# Patient Record
Sex: Female | Born: 1950
Health system: Southern US, Community
[De-identification: ages and names within clinical notes are randomized; demographics above are authoritative.]

## PROBLEM LIST (undated history)

## (undated) DIAGNOSIS — J189 Pneumonia, unspecified organism: Secondary | ICD-10-CM

## (undated) DIAGNOSIS — R5383 Other fatigue: Secondary | ICD-10-CM

## (undated) DIAGNOSIS — I341 Nonrheumatic mitral (valve) prolapse: Secondary | ICD-10-CM

## (undated) DIAGNOSIS — I480 Paroxysmal atrial fibrillation: Secondary | ICD-10-CM

## (undated) DIAGNOSIS — C801 Malignant (primary) neoplasm, unspecified: Secondary | ICD-10-CM

## (undated) DIAGNOSIS — M1990 Unspecified osteoarthritis, unspecified site: Secondary | ICD-10-CM

## (undated) DIAGNOSIS — J029 Acute pharyngitis, unspecified: Secondary | ICD-10-CM

## (undated) DIAGNOSIS — M797 Fibromyalgia: Secondary | ICD-10-CM

## (undated) DIAGNOSIS — I4892 Unspecified atrial flutter: Secondary | ICD-10-CM

## (undated) DIAGNOSIS — E669 Obesity, unspecified: Secondary | ICD-10-CM

## (undated) DIAGNOSIS — M940 Chondrocostal junction syndrome [Tietze]: Secondary | ICD-10-CM

## (undated) DIAGNOSIS — G4700 Insomnia, unspecified: Secondary | ICD-10-CM

## (undated) DIAGNOSIS — J329 Chronic sinusitis, unspecified: Secondary | ICD-10-CM

## (undated) DIAGNOSIS — M76899 Other specified enthesopathies of unspecified lower limb, excluding foot: Secondary | ICD-10-CM

## (undated) DIAGNOSIS — A048 Other specified bacterial intestinal infections: Secondary | ICD-10-CM

## (undated) HISTORY — DX: Pneumonia, unspecified organism: J18.9

## (undated) HISTORY — PX: APPENDECTOMY: SHX54

## (undated) HISTORY — PX: BREAST LUMPECTOMY: SHX2

## (undated) HISTORY — DX: Obesity, unspecified: E66.9

## (undated) HISTORY — PX: CHOLECYSTECTOMY: SHX55

## (undated) HISTORY — DX: Other specified bacterial intestinal infections: A04.8

## (undated) HISTORY — DX: Paroxysmal atrial fibrillation: I48.0

## (undated) HISTORY — PX: TONSILLECTOMY: SUR1361

## (undated) HISTORY — DX: Unspecified atrial flutter: I48.92

## (undated) HISTORY — DX: Chronic sinusitis, unspecified: J32.9

## (undated) HISTORY — DX: Other fatigue: R53.83

## (undated) HISTORY — DX: Chondrocostal junction syndrome (tietze): M94.0

## (undated) HISTORY — DX: Nonrheumatic mitral (valve) prolapse: I34.1

## (undated) HISTORY — DX: Acute pharyngitis, unspecified: J02.9

## (undated) HISTORY — PX: ABDOMINAL HYSTERECTOMY: SHX81

## (undated) HISTORY — DX: Other specified enthesopathies of unspecified lower limb, excluding foot: M76.899

## (undated) HISTORY — DX: Insomnia, unspecified: G47.00

## (undated) HISTORY — DX: Fibromyalgia: M79.7

---

## 1999-11-23 ENCOUNTER — Other Ambulatory Visit: Admission: RE | Admit: 1999-11-23 | Discharge: 1999-11-23 | Payer: Self-pay | Admitting: Obstetrics and Gynecology

## 2007-03-11 ENCOUNTER — Encounter: Admission: RE | Admit: 2007-03-11 | Discharge: 2007-03-11 | Payer: Self-pay | Admitting: Family Medicine

## 2007-03-12 ENCOUNTER — Encounter: Admission: RE | Admit: 2007-03-12 | Discharge: 2007-03-12 | Payer: Self-pay | Admitting: Family Medicine

## 2008-05-06 ENCOUNTER — Encounter: Admission: RE | Admit: 2008-05-06 | Discharge: 2008-05-06 | Payer: Self-pay | Admitting: Family Medicine

## 2008-05-13 ENCOUNTER — Encounter: Admission: RE | Admit: 2008-05-13 | Discharge: 2008-05-13 | Payer: Self-pay | Admitting: Family Medicine

## 2008-05-18 ENCOUNTER — Encounter: Admission: RE | Admit: 2008-05-18 | Discharge: 2008-05-18 | Payer: Self-pay | Admitting: Family Medicine

## 2008-05-25 ENCOUNTER — Encounter: Admission: RE | Admit: 2008-05-25 | Discharge: 2008-05-25 | Payer: Self-pay | Admitting: Family Medicine

## 2008-07-03 HISTORY — PX: CARDIAC CATHETERIZATION: SHX172

## 2008-08-09 ENCOUNTER — Observation Stay (HOSPITAL_COMMUNITY): Admission: AD | Admit: 2008-08-09 | Discharge: 2008-08-10 | Payer: Self-pay | Admitting: Cardiology

## 2008-08-09 ENCOUNTER — Encounter: Payer: Self-pay | Admitting: Emergency Medicine

## 2008-08-09 ENCOUNTER — Ambulatory Visit: Payer: Self-pay | Admitting: Diagnostic Radiology

## 2010-05-04 ENCOUNTER — Ambulatory Visit: Payer: Self-pay | Admitting: Cardiology

## 2010-05-16 ENCOUNTER — Ambulatory Visit: Payer: Self-pay

## 2010-05-16 ENCOUNTER — Encounter: Payer: Self-pay | Admitting: Cardiology

## 2010-05-16 ENCOUNTER — Ambulatory Visit: Payer: Self-pay | Admitting: Cardiology

## 2010-05-16 ENCOUNTER — Ambulatory Visit (HOSPITAL_COMMUNITY): Admission: RE | Admit: 2010-05-16 | Discharge: 2010-05-16 | Payer: Self-pay | Admitting: Cardiology

## 2010-05-16 HISTORY — PX: TRANSTHORACIC ECHOCARDIOGRAM: SHX275

## 2010-05-31 HISTORY — PX: OTHER SURGICAL HISTORY: SHX169

## 2010-06-06 ENCOUNTER — Encounter: Admission: RE | Admit: 2010-06-06 | Discharge: 2010-06-06 | Payer: Self-pay | Admitting: Family Medicine

## 2010-10-18 LAB — COMPREHENSIVE METABOLIC PANEL
ALT: 20 U/L (ref 0–35)
AST: 24 U/L (ref 0–37)
Albumin: 3.6 g/dL (ref 3.5–5.2)
CO2: 26 mEq/L (ref 19–32)
Calcium: 9.5 mg/dL (ref 8.4–10.5)
GFR calc Af Amer: >60 mL/min (ref >60)
Sodium: 141 mEq/L (ref 135–145)
Total Protein: 6 g/dL (ref 6.0–8.3)

## 2010-10-18 LAB — CARDIAC PANEL(CRET KIN+CKTOT+MB+TROPI)
CK, MB: 0.7 ng/mL (ref 0.3–4.0)
CK, MB: 0.8 ng/mL (ref 0.3–4.0)
Relative Index: INVALID (ref 0.0–2.5)
Relative Index: INVALID (ref 0.0–2.5)
Total CK: 69 U/L (ref 7–177)
Troponin I: <0.01 ng/mL (ref 0.00–0.06)

## 2010-10-18 LAB — BASIC METABOLIC PANEL
BUN: 13 mg/dL (ref 6–23)
CO2: 28 mEq/L (ref 19–32)
Chloride: 106 mEq/L (ref 96–112)
Creatinine, Ser: 0.6 mg/dL (ref 0.4–1.2)
GFR calc Af Amer: >60 mL/min (ref >60)

## 2010-10-18 LAB — CBC
MCHC: 34.7 g/dL (ref 30.0–36.0)
MCV: 95.6 fL (ref 78.0–100.0)
RBC: 4.2 MIL/uL (ref 3.87–5.11)

## 2010-10-18 LAB — POCT CARDIAC MARKERS: Myoglobin, poc: 45.2 ng/mL (ref 12–200)

## 2010-10-18 LAB — DIFFERENTIAL
Basophils Relative: 1 % (ref 0–1)
Eosinophils Absolute: 0.1 10*3/uL (ref 0.0–0.7)
Monocytes Relative: 6 % (ref 3–12)
Neutrophils Relative %: 60 % (ref 43–77)

## 2010-10-18 LAB — LIPID PANEL
Cholesterol: 183 mg/dL (ref 0–200)
HDL: 50 mg/dL (ref >39)
LDL Cholesterol: 114 mg/dL — ABNORMAL HIGH (ref 0–99)
Triglycerides: 94 mg/dL (ref <150)

## 2010-10-18 LAB — TSH: TSH: 2.503 u[IU]/mL (ref 0.350–4.500)

## 2010-11-15 NOTE — H&P (Signed)
Sheri Sims, Sheri NO.:  Sims   MEDICAL RECORD NO.:  [id number]          PATIENT TYPE:  INP   LOCATION:  3713                         FACILITY:  [facility]   PHYSICIAN:  Georga Hacking, M.D.DATE OF BIRTH:  1951/01/24   DATE OF ADMISSION:  08/09/2008  DATE OF DISCHARGE:                              HISTORY & PHYSICAL   REASON FOR ADMISSION:  Chest pain.   HISTORY:  The patient is a 59 year old female who has previously seen  Dr. Deborah Chalk for episodic palpitations and has been told in the past that  it is due to excess caffeine and stress that she has had arrhythmia.  She could not give any further details.  She was in her usual state of  health and went to church this morning.  On walking out of church, she  had the onset of midsternal chest discomfort that she described as a  fist gripping for around 10-15 seconds at a time that would then let up  but the pain was there most of the time.  It became somewhat sharper and  lasted about 45 minutes and had some aching associated in her left arm.  She was mildly short of breath with it, but did not sweat and did not  have radiation of the pain.  She had no GI symptoms or belching.  She  went to the [facility] where the emergency  room physician did an EKG that was normal and an initial set of cardiac  enzymes that was normal, and recommended hospital admission.  She was  transferred by [facility] and is admitted to rule out a myocardial  infarction.  She is pain free at the present time.   PAST MEDICAL HISTORY:  Remarkable for intermittent sinusitis, mild  obesity.  She has no history of hypertension, hyperlipidemia, ulcers,  but does have generalized arthritis.   PAST SURGERIES:  Appendectomy, cholecystectomy, hysterectomy,  tonsillectomy.   ALLERGIES:  CODEINE, SULFA and HYDROCORTISONE CREAM.   CURRENT MEDICATIONS:  She takes Aleve for arthritis and occasional  multivitamins, and  vitamin D.  She occasionally uses Claritin for  allergies.   SOCIAL HISTORY:  She is married.  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  Is a  nonsmoker and does not use alcohol to excess.   FAMILY HISTORY:  Father died at age 62 of Guillain-Barre Syndrome.  Mother died age 4 of recurrent breast cancer.  Sister has fibromyalgia.  There is no premature cardiovascular disease in the family.   REVIEW OF SYSTEMS:  She has mild obesity.  She normally feels fairly  well.  She does have episodic sinusitis and complains that her teeth  hurt mainly from sinusitis.  She has no eye, ear, nose or throat  problems.  No difficulty swallowing.  Denies dyspepsia, hematochezia,  constipation or diarrhea.  Denies urinary symptoms.  Has significant  generalized arthritis with point tenderness of her ribs.  She has hip  arthritis, knee arthritis, and generalized aching.  She has occasional  headaches that have been worse that she attributes to sinusitis.  She  has no history of stroke or TIA and denies other review of systems that  would be positive.   EXAMINATION:  CONSTITUTIONAL:  She is a pleasant, mildly obese female  who is currently in no acute distress.  VITAL SIGNS:  Blood pressure is 123/78, pulse was 76 and regular.  SKIN:  Warm and dry.  ENT:  EOMI.  PERRLA.  CNS clear.  Fundi not examined.  Pharynx negative.  NECK:  Supple.  No masses, JVD, thyromegaly or bruits.  LUNGS:  Clear to A and P.  CARDIAC:  Normal S1, S2.  No S3 or murmur.  ABDOMEN:  Soft, nontender.  No masses, hepatosplenomegaly, or aneurysm.  EXTREMITIES:  Distal pulses are present at 2+.  There is no peripheral  edema noted.   DIAGNOSTIC STUDIES:  A 12-lead EKG is within normal limits.  Her CBC was  within normal limits.  Initial chemistry panel was normal.  Initial  point of care enzymes were normal.  Portable chest x-ray showed low lung  volumes and no acute  pulmonary process.   IMPRESSION:  1. Atypical chest pain with normal EKG.  2. Mild obesity.  3. Mild sinusitis.  4. History of arrhythmia, probably nonsignificant.   RECOMMENDATIONS:  Check serial enzymes, begin aspirin, Lovenox for DVT  prophylaxis, keep n.p.o. tonight, can decide on inpatient versus  outpatient workup.      Georga Hacking, M.D.  Electronically Signed     WST/MEDQ  D:  08/09/2008  T:  08/09/2008  Job:  [id number]   cc:   Colleen Can. Deborah Chalk, M.D.

## 2010-11-15 NOTE — Discharge Summary (Signed)
Sheri Sims, Sheri Sims NO.:  [id number]   MEDICAL RECORD NO.:  [id number]          PATIENT TYPE:  INP   LOCATION:  3713                         FACILITY:  [facility]   PHYSICIAN:  Colleen Can. Deborah Chalk, M.D.DATE OF BIRTH:  Dec 26, 1950   DATE OF ADMISSION:  08/09/2008  DATE OF DISCHARGE:  08/10/2008                               DISCHARGE SUMMARY   DISCHARGE DIAGNOSES:  1. Atypical chest pain with subsequent elective cardiac      catheterization with normal coronaries.  2. Symptomatic palpitations.  3. Mild obesity.  4. Generalized arthritis.   HISTORY OF PRESENT ILLNESS:  The patient is a 60 year old white female  who has a history of symptomatic palpitations.  She presented to the  hospital on August 09, 2008, with complaints of chest pain.  She had  been in her usual state of health and had gone to church on the morning  of admission.  While walking outside, she had the onset of midsternal  chest discomfort that was first described as a gripping discomfort and  then somewhat of a sharper pain.  She was mildly short of breath.  She  had also noted palpitations as well.  She was subsequently seen in the  emergency room and was admitted for further evaluation.   Please see the history and physical per Dr. Viann Fish for further  patient presentation and profile.   LABORATORY DATA:  A 12-lead EKG was normal.  CBC was normal.  Chemistries were normal.  Chest x-ray showed no acute process.  Cardiac  enzymes were negative.   HOSPITAL COURSE:  The patient was admitted.  She ruled out negative for  myocardial infarction.  We proceeded on with cardiac catheterization the  following day that procedure was performed without any known  complications.  The findings were that the coronaries as well as LV  function are normal.  Postprocedure, she was transferred to [facility]  She will be discharged tonight and we will reinitiate beta-blocker  therapy.   DISCHARGE  CONDITION:  Stable.   DISCHARGE MEDICINES:  She can continue Claritin, Aleve, vitamin D, and  multivitamin as she was taking before.  We will add Toprol-XL 25 mg a  day.   We will plan on seeing her back in the office in 2 weeks for followup.  She is asked to call to set that appointment up certainly sooner if any  problems arise in the interim.      Sharlee Blew, N.P.      Colleen Can. Deborah Chalk, M.D.  Electronically Signed    LC/MEDQ  D:  08/10/2008  T:  08/11/2008  Job:  [id number]

## 2010-11-15 NOTE — Cardiovascular Report (Signed)
Sheri Sims, NEUKAM NO.:  [id number]   MEDICAL RECORD NO.:  [id number]          PATIENT TYPE:  INP   LOCATION:  3713                         FACILITY:  [facility]   PHYSICIAN:  Colleen Can. Deborah Chalk, M.D.DATE OF BIRTH:  Sep 04, 1950   DATE OF PROCEDURE:  DATE OF DISCHARGE:  08/10/2008                            CARDIAC CATHETERIZATION   PROCEDURE:  Left heart catheterization with selective coronary  angiography, left ventricular angiography.   TYPE AND SITE OF ENTRY:  Percutaneous right femoral artery with Angio-  Seal.   CATHETERS:  A 6-French four-curved Judkins right coronary catheters, 6-  French 3.5 curved Judkins left coronary catheter, 6-French pigtail  ventriculographic catheter.   CONTRAST MATERIAL:  Omnipaque.   MEDICATIONS GIVEN PRIOR TO PROCEDURE:  Valium 10 mg p.o.   MEDICATIONS GIVEN DURING THE PROCEDURE:  Versed 2 mg IV.   COMMENTS:  The patient tolerated the procedure well.   HEMODYNAMIC DATA:  The aortic pressure was 114/61, LV is 123/03-10.  There was no aortic valve gradient noted on pullback.   ANGIOGRAPHIC DATA:  1. The left main coronary artery is normal.  2. Left anterior descending.  Left anterior descending was a      moderately-large bifurcating branch.  It was normal.  3. The intermediate coronary artery is small and moderate in size.  It      was normal.  4. Left circumflex.  Left circumflex is a reasonably large vessel.  It      was normal.  5. Right coronary artery.  Right coronary artery is a small dominant      vessel.  It was normal.   Left ventricular angiogram was performed in the RAO position.  Overall,  left ventricular size and silhouette were normal.  There is no mitral  regurgitation, intracardiac calcification, or intracavitary filling  defect.  The global ejection fraction was 55% to 60%.  The left  ventricle was normal.   OVERALL IMPRESSION:  1. Normal left ventricular function.  2. Normal coronary arteries.   PLAN:  Angio-Seal was performed and the patient will be discharged.  She  will be discharged on p.r.n. beta blockers as well as the understanding  that chest discomfort associated with her palpitations will not reflect  impending myocardial infarction at least at this point in time and for  the near future.  We will encourage her to modify cardiovascular risk  factors.      Colleen Can. Deborah Chalk, M.D.  Electronically Signed     SNT/MEDQ  D:  08/10/2008  T:  08/11/2008  Job:  [id number]

## 2011-02-09 ENCOUNTER — Telehealth: Payer: Self-pay | Admitting: Cardiology

## 2011-02-09 NOTE — Telephone Encounter (Signed)
Called today because she has an arhythmic heart and is on Metoprolol and can not seem to get her heart to calm down when doing normal activity. This has been going on since Monday morning. Please call back. I have pulled the patients chart.

## 2011-02-09 NOTE — Telephone Encounter (Signed)
Called pt/ informed dr Deborah Chalk retired and she needs to follow up with PCP. no cardiologist was listed on chart  to follow up with, was to be managed by pcp. Pt verbalized understanding and would contact.

## 2011-02-10 ENCOUNTER — Telehealth: Payer: Self-pay | Admitting: Cardiology

## 2011-02-10 NOTE — Telephone Encounter (Signed)
Office needs Last OV and Stress Test faxed if available

## 2011-02-10 NOTE — Telephone Encounter (Signed)
Faxed last OV Note and Stress Report to [phone] today

## 2011-05-19 ENCOUNTER — Other Ambulatory Visit: Payer: Self-pay | Admitting: Nurse Practitioner

## 2011-05-19 ENCOUNTER — Other Ambulatory Visit: Payer: Self-pay | Admitting: *Deleted

## 2011-05-19 MED ORDER — METOPROLOL SUCCINATE ER 25 MG PO TB24: 25.0000 mg | ORAL_TABLET | Freq: Every day | ORAL | Status: DC

## 2011-10-11 ENCOUNTER — Encounter: Payer: Self-pay | Admitting: *Deleted

## 2011-12-26 ENCOUNTER — Other Ambulatory Visit: Payer: Self-pay | Admitting: Family Medicine

## 2011-12-26 DIAGNOSIS — Z1231 Encounter for screening mammogram for malignant neoplasm of breast: Secondary | ICD-10-CM

## 2012-01-03 ENCOUNTER — Ambulatory Visit
Admission: RE | Admit: 2012-01-03 | Discharge: 2012-01-03 | Disposition: A | Discharge status: Home / Self Care | Payer: BC Managed Care – PPO | Source: Ambulatory Visit | Attending: Family Medicine | Admitting: Family Medicine

## 2012-01-03 DIAGNOSIS — Z1231 Encounter for screening mammogram for malignant neoplasm of breast: Secondary | ICD-10-CM

## 2012-01-09 ENCOUNTER — Other Ambulatory Visit: Payer: Self-pay | Admitting: Family Medicine

## 2012-01-09 DIAGNOSIS — R928 Other abnormal and inconclusive findings on diagnostic imaging of breast: Secondary | ICD-10-CM

## 2012-01-12 ENCOUNTER — Ambulatory Visit
Admission: RE | Admit: 2012-01-12 | Discharge: 2012-01-12 | Disposition: A | Discharge status: Home / Self Care | Payer: BC Managed Care – PPO | Source: Ambulatory Visit | Attending: Family Medicine | Admitting: Family Medicine

## 2012-01-12 ENCOUNTER — Other Ambulatory Visit: Payer: Self-pay | Admitting: Family Medicine

## 2012-01-12 DIAGNOSIS — R928 Other abnormal and inconclusive findings on diagnostic imaging of breast: Secondary | ICD-10-CM

## 2012-07-15 ENCOUNTER — Other Ambulatory Visit: Payer: Self-pay | Admitting: Family Medicine

## 2012-07-15 DIAGNOSIS — N63 Unspecified lump in unspecified breast: Secondary | ICD-10-CM

## 2012-08-07 ENCOUNTER — Ambulatory Visit
Admission: RE | Admit: 2012-08-07 | Discharge: 2012-08-07 | Disposition: A | Discharge status: Home / Self Care | Payer: BC Managed Care – PPO | Source: Ambulatory Visit | Attending: Family Medicine | Admitting: Family Medicine

## 2012-08-07 DIAGNOSIS — N63 Unspecified lump in unspecified breast: Secondary | ICD-10-CM

## 2013-01-24 ENCOUNTER — Other Ambulatory Visit: Payer: Self-pay | Admitting: Family Medicine

## 2013-01-24 DIAGNOSIS — N63 Unspecified lump in unspecified breast: Secondary | ICD-10-CM

## 2013-02-12 ENCOUNTER — Ambulatory Visit
Admission: RE | Admit: 2013-02-12 | Discharge: 2013-02-12 | Disposition: A | Discharge status: Home / Self Care | Payer: BC Managed Care – PPO | Source: Ambulatory Visit | Attending: Family Medicine | Admitting: Family Medicine

## 2013-02-12 DIAGNOSIS — N63 Unspecified lump in unspecified breast: Secondary | ICD-10-CM

## 2013-03-28 DIAGNOSIS — R768 Other specified abnormal immunological findings in serum: Secondary | ICD-10-CM | POA: Insufficient documentation

## 2013-03-28 DIAGNOSIS — F419 Anxiety disorder, unspecified: Secondary | ICD-10-CM | POA: Insufficient documentation

## 2013-07-11 DIAGNOSIS — M19049 Primary osteoarthritis, unspecified hand: Secondary | ICD-10-CM | POA: Insufficient documentation

## 2013-07-11 DIAGNOSIS — IMO0001 Reserved for inherently not codable concepts without codable children: Secondary | ICD-10-CM | POA: Insufficient documentation

## 2013-11-03 DIAGNOSIS — J189 Pneumonia, unspecified organism: Secondary | ICD-10-CM | POA: Insufficient documentation

## 2013-11-03 DIAGNOSIS — R002 Palpitations: Secondary | ICD-10-CM | POA: Insufficient documentation

## 2013-11-03 DIAGNOSIS — J9601 Acute respiratory failure with hypoxia: Secondary | ICD-10-CM | POA: Insufficient documentation

## 2013-11-03 DIAGNOSIS — R0781 Pleurodynia: Secondary | ICD-10-CM | POA: Insufficient documentation

## 2013-11-03 DIAGNOSIS — M797 Fibromyalgia: Secondary | ICD-10-CM | POA: Insufficient documentation

## 2014-02-13 ENCOUNTER — Ambulatory Visit (INDEPENDENT_AMBULATORY_CARE_PROVIDER_SITE_OTHER): Payer: Medicare Other | Admitting: Internal Medicine

## 2014-02-13 ENCOUNTER — Encounter: Payer: Self-pay | Admitting: Internal Medicine

## 2014-02-13 ENCOUNTER — Other Ambulatory Visit (INDEPENDENT_AMBULATORY_CARE_PROVIDER_SITE_OTHER): Payer: Medicare Other

## 2014-02-13 VITALS — BP 102/64 | HR 65 | Temp 98.7°F | Ht 66.0 in | Wt 187.6 lb

## 2014-02-13 DIAGNOSIS — R918 Other nonspecific abnormal finding of lung field: Secondary | ICD-10-CM

## 2014-02-13 LAB — CBC WITH DIFFERENTIAL/PLATELET
BASOS PCT: 1.1 % (ref 0.0–3.0)
Basophils Absolute: 0.1 10*3/uL (ref 0.0–0.1)
EOS ABS: 0.2 10*3/uL (ref 0.0–0.7)
EOS PCT: 2.5 % (ref 0.0–5.0)
HEMATOCRIT: 43.7 % (ref 36.0–46.0)
HEMOGLOBIN: 14.6 g/dL (ref 12.0–15.0)
LYMPHS ABS: 2.2 10*3/uL (ref 0.7–4.0)
Lymphocytes Relative: 36.4 % (ref 12.0–46.0)
MCHC: 33.5 g/dL (ref 30.0–36.0)
MCV: 96.5 fl (ref 78.0–100.0)
MONO ABS: 0.5 10*3/uL (ref 0.1–1.0)
Monocytes Relative: 8.1 % (ref 3.0–12.0)
NEUTROS ABS: 3.2 10*3/uL (ref 1.4–7.7)
NEUTROS PCT: 51.9 % (ref 43.0–77.0)
Platelets: 271 10*3/uL (ref 150.0–400.0)
RBC: 4.53 Mil/uL (ref 3.87–5.11)
RDW: 13.6 % (ref 11.5–15.5)
WBC: 6.1 10*3/uL (ref 4.0–10.5)

## 2014-02-13 LAB — SEDIMENTATION RATE: Sed Rate: 20 mm/hr (ref 0–22)

## 2014-02-13 NOTE — Progress Notes (Signed)
Quick Note:  Spoke with pt and notified of results per Dr. Wert. Pt verbalized understanding and denied any questions.  ______ 

## 2014-02-13 NOTE — Progress Notes (Signed)
Subjective:    Patient ID: Sheri Sims, female    DOB: 13-Jan-1951   MRN: [id number]  HPI  63 yowf never smoker bad pna age 63 but did fine thereafter except for seasonal allergies responding to otcs with abrupt R side pleuritic cp/ chills Nov 03 2013 > [place]  [facility] > dx RML pna admit x 2 days > complete resolution of R pain, chills and fever resolved but persistent fatigue and infiltrates RML gradually improving  But still present on  cxr 01/30/14 so referred to pulmonary clinic 02/13/2014 by Dr Sheri Sims     02/13/2014 1st [facility] office visit/ Sheri Sims  Chief Complaint  Patient presents with  . Pulmonary Consult    Referred per Dr. Kathryne Sims for eval of abn cxr. Pt states that she was dxed with PNA 4 months ago.  She c/o SOB with walking up and down stairs and if she bends over alot.   overall pattern was very abrupt onset of classic R lat pleuritic cp and much better by the time she was discharged and slow but steady in all symptoms since then . Never had hemoptysis or purulent sputum - more fatigue limiting activity than sob.   No obvious other patterns in day to day or daytime variabilty or assoc chronic cough or cp or chest tightness, subjective wheeze overt sinus or hb symptoms. No unusual exp hx or h/o childhood pna/ asthma or knowledge of premature birth.  Sleeping poorly due to fibromyalgia  without nocturnal  or early am exacerbation  of respiratory  c/o's or need for noct saba. Also denies any obvious fluctuation of symptoms with weather or environmental changes or other aggravating or alleviating factors except as outlined above   Current Medications, Allergies, Complete Past Medical History, Past Surgical History, Family History, and Social History were reviewed in [employer] record.            Review of Systems  Constitutional: Negative for fever, chills and unexpected weight change.  HENT: Negative for congestion, dental problem, ear  pain, nosebleeds, postnasal drip, rhinorrhea, sinus pressure, sneezing, sore throat, trouble swallowing and voice change.   Eyes: Negative for visual disturbance.  Respiratory: Positive for shortness of breath. Negative for cough and choking.   Cardiovascular: Negative for chest pain and leg swelling.  Gastrointestinal: Negative for vomiting, abdominal pain and diarrhea.  Genitourinary: Negative for difficulty urinating.  Musculoskeletal: Positive for arthralgias.  Skin: Positive for rash.  Neurological: Negative for tremors, syncope and headaches.  Hematological: Does not bruise/bleed easily.       Objective:   Physical Exam  amb wf nad   Wt Readings from Last 3 Encounters:  02/13/14 187 lb 9.6 oz (85.095 kg)      HEENT: nl dentition, turbinates, and orophanx. Nl external ear canals without cough reflex   NECK :  without JVD/Nodes/TM/ nl carotid upstrokes bilaterally   LUNGS: no acc muscle use, clear to A and P bilaterally without cough on insp or exp maneuvers   CV:  RRR  no s3 or murmur or increase in P2, no edema   ABD:  soft and nontender with nl excursion in the supine position. No bruits or organomegaly, bowel sounds nl  MS:  warm without deformities, calf tenderness, cyanosis or clubbing  SKIN: warm and dry without lesions    NEURO:  alert, approp, no deficits   cxr reports c/w slowly  resolving RML pna most recent 01/30/14    Baseline pcxr  08/09/2008 wnl     Lab Results  Component Value Date   ESRSEDRATE 20 02/13/2014     Lab Results  Component Value Date   WBC 6.1 02/13/2014   HGB 14.6 02/13/2014   HCT 43.7 02/13/2014   MCV 96.5 02/13/2014   PLT 271.0 02/13/2014       Assessment & Plan:

## 2014-02-13 NOTE — Patient Instructions (Addendum)
Please remember to go to the lab department downstairs for your tests - we will call you with the results when they are available.  Please schedule a follow up office visit in 4 weeks, sooner if needed with cxr and comparison cxr's

## 2014-02-15 NOTE — Assessment & Plan Note (Addendum)
-  acute onset May 2015 RML  - ESR 02/13/2014 = 20  Hx is classic for an acute CAP with marked improvement clinically and incomplete clearing by serial cxr - this is a classic story for RML Pna and given that she's a never smoker the likelihood of this representing a co-existing malignancy in the same lobe where she had a slowing resolving cap seems extremely low.  Will see back for cxr in 4 weeks and asked her at that time to provide the discs from her prev w/u for serial comparison.

## 2014-03-18 ENCOUNTER — Other Ambulatory Visit: Payer: Self-pay

## 2014-03-18 DIAGNOSIS — Z1231 Encounter for screening mammogram for malignant neoplasm of breast: Secondary | ICD-10-CM

## 2014-03-24 ENCOUNTER — Ambulatory Visit (INDEPENDENT_AMBULATORY_CARE_PROVIDER_SITE_OTHER): Payer: Medicare Other | Admitting: Internal Medicine

## 2014-03-24 ENCOUNTER — Ambulatory Visit (INDEPENDENT_AMBULATORY_CARE_PROVIDER_SITE_OTHER)
Admission: RE | Admit: 2014-03-24 | Discharge: 2014-03-24 | Disposition: A | Discharge status: Home / Self Care | Payer: Medicare Other | Source: Ambulatory Visit | Attending: Internal Medicine | Admitting: Internal Medicine

## 2014-03-24 ENCOUNTER — Encounter: Payer: Self-pay | Admitting: Internal Medicine

## 2014-03-24 VITALS — BP 110/76 | HR 60 | Temp 98.2°F | Ht 65.5 in | Wt 188.0 lb

## 2014-03-24 DIAGNOSIS — R918 Other nonspecific abnormal finding of lung field: Secondary | ICD-10-CM

## 2014-03-24 NOTE — Progress Notes (Signed)
Subjective:    Patient ID: Sheri Sims, female    DOB: 1951/04/24   MRN: [id number]    Brief patient profile:  21 yowf never smoker bad pna age 63 but did fine thereafter except for seasonal allergies responding to otcs with abrupt R side pleuritic cp/ chills Nov 03 2013 > [place]  ER > dx RML pna admit x 2 days > complete resolution of R pain, chills and fever resolved but persistent fatigue and infiltrates RML gradually improving  But still present on  cxr 01/30/14 so referred to pulmonary clinic 02/13/2014 by Dr Kathryne Eriksson    History of Present Illness  02/13/2014 1st [facility] office visit/ Aubreanna Percle  Chief Complaint  Patient presents with  . Pulmonary Consult    Referred per Dr. Kathryne Eriksson for eval of abn cxr. Pt states that she was dxed with PNA 4 months ago.  She c/o SOB with walking up and down stairs and if she bends over alot.   overall pattern was very abrupt onset of classic R lat pleuritic cp and much better by the time she was discharged and slow but steady improvement  in all symptoms since then . Never had hemoptysis or purulent sputum - more fatigue limiting activity than sob.  rec No change rx   03/24/2014 f/u ov/Michaelanthony Kempton re: f/u ? RML mass  Chief Complaint  Patient presents with  . Followup with CXR    Pt states that she is doing well today and denies any new co's.    Not limited by breathing from desired activities    No obvious day to day or daytime variabilty or assoc chronic cough or cp or chest tightness, subjective wheeze overt sinus or hb symptoms. No unusual exp hx or h/o childhood pna/ asthma or knowledge of premature birth.  Sleeping ok without nocturnal  or early am exacerbation  of respiratory  c/o's or need for noct saba. Also denies any obvious fluctuation of symptoms with weather or environmental changes or other aggravating or alleviating factors except as outlined above   Current Medications, Allergies, Complete Past Medical History, Past  Surgical History, Family History, and Social History were reviewed in [employer] record.  ROS  The following are not active complaints unless bolded sore throat, dysphagia, dental problems, itching, sneezing,  nasal congestion or excess/ purulent secretions, ear ache,   fever, chills, sweats, unintended wt loss, pleuritic or exertional cp, hemoptysis,  orthopnea pnd or leg swelling, presyncope, palpitations, heartburn, abdominal pain, anorexia, nausea, vomiting, diarrhea  or change in bowel or urinary habits, change in stools or urine, dysuria,hematuria,  rash, arthralgias, visual complaints, headache, numbness weakness or ataxia or problems with walking or coordination,  change in mood/affect or memory.                       Objective:   Physical Exam  amb wf nad   03/24/14           188  Wt Readings from Last 3 Encounters:  02/13/14 187 lb 9.6 oz (85.095 kg)      HEENT: nl dentition, turbinates, and orophanx. Nl external ear canals without cough reflex   NECK :  without JVD/Nodes/TM/ nl carotid upstrokes bilaterally   LUNGS: no acc muscle use, clear to A and P bilaterally without cough on insp or exp maneuvers   CV:  RRR  no s3 or murmur or increase in P2, no edema   ABD:  soft and nontender with nl excursion in the supine position. No bruits or organomegaly, bowel sounds nl  MS:  warm without deformities, calf tenderness, cyanosis or clubbing  SKIN: warm and dry without lesions    NEURO:  alert, approp, no deficits     cxr reports c/w slowly  resolving RML pna most recent 01/30/14    CXR  03/25/2014 : The lungs are clear. Heart size is normal. There is no pneumothorax  or pleural effusion. No focal bony abnormality.     Lab Results  Component Value Date   ESRSEDRATE 20 02/13/2014     Lab Results  Component Value Date   WBC 6.1 02/13/2014   HGB 14.6 02/13/2014   HCT 43.7 02/13/2014   MCV 96.5 02/13/2014   PLT 271.0 02/13/2014         Assessment & Plan:

## 2014-03-24 NOTE — Patient Instructions (Signed)
Please see patient coordinator before you leave today  to schedule CT chest no contrast

## 2014-03-25 NOTE — Progress Notes (Signed)
Quick Note:  Spoke with pt and notified of results per Dr. Wert. Pt verbalized understanding and denied any questions.  ______ 

## 2014-03-26 ENCOUNTER — Encounter: Payer: Self-pay | Admitting: Internal Medicine

## 2014-03-26 NOTE — Assessment & Plan Note (Signed)
-  acute onset May 2015 RML  - ESR 02/13/2014 = 20  Since we have no apples to apples comparison cxr's from her acute pna it's hard to be sure we could actually see the RML process that by CT suggested possible malignancy so :  Discussed in detail all the  indications, usual  risks and alternatives  relative to the benefits with patient who agrees to proceed with noncontrasted CT

## 2014-03-31 ENCOUNTER — Ambulatory Visit
Admission: RE | Admit: 2014-03-31 | Discharge: 2014-03-31 | Disposition: A | Discharge status: Home / Self Care | Payer: 59 | Source: Ambulatory Visit

## 2014-03-31 ENCOUNTER — Ambulatory Visit (INDEPENDENT_AMBULATORY_CARE_PROVIDER_SITE_OTHER)
Admission: RE | Admit: 2014-03-31 | Discharge: 2014-03-31 | Disposition: A | Discharge status: Home / Self Care | Payer: 59 | Source: Ambulatory Visit | Attending: Internal Medicine | Admitting: Internal Medicine

## 2014-03-31 DIAGNOSIS — R918 Other nonspecific abnormal finding of lung field: Secondary | ICD-10-CM

## 2014-03-31 DIAGNOSIS — Z1231 Encounter for screening mammogram for malignant neoplasm of breast: Secondary | ICD-10-CM

## 2014-04-01 ENCOUNTER — Encounter: Payer: Self-pay | Admitting: Internal Medicine

## 2014-04-01 NOTE — Progress Notes (Signed)
Quick Note:  LMTCB ______ 

## 2014-04-02 NOTE — Progress Notes (Signed)
Quick Note:  Spoke with pt and notified of results per Dr. Wert. Pt verbalized understanding and denied any questions.  ______ 

## 2014-06-19 ENCOUNTER — Other Ambulatory Visit: Payer: Self-pay | Admitting: Internal Medicine

## 2014-06-19 DIAGNOSIS — R918 Other nonspecific abnormal finding of lung field: Secondary | ICD-10-CM

## 2014-07-06 ENCOUNTER — Other Ambulatory Visit: Payer: 59

## 2014-07-07 ENCOUNTER — Ambulatory Visit (INDEPENDENT_AMBULATORY_CARE_PROVIDER_SITE_OTHER)
Admission: RE | Admit: 2014-07-07 | Discharge: 2014-07-07 | Disposition: A | Discharge status: Home / Self Care | Payer: 59 | Source: Ambulatory Visit | Attending: Internal Medicine | Admitting: Internal Medicine

## 2014-07-07 DIAGNOSIS — R918 Other nonspecific abnormal finding of lung field: Secondary | ICD-10-CM

## 2014-07-15 ENCOUNTER — Telehealth: Payer: Self-pay | Admitting: Internal Medicine

## 2014-07-15 NOTE — Telephone Encounter (Signed)
lmtcb for pt.   Dr. Melvyn Novas please advise on pt's CT results from 07/07/2014. Thanks.

## 2014-07-15 NOTE — Telephone Encounter (Signed)
Sorry I must have clicked the wrong button after reading it but it looked fine, radiology recs repeat in 6 m to make sure it's nothing (which is probably the case) so I placed her in our tickle file for recall then but I need to see her sooner prn resp symptoms

## 2014-07-16 NOTE — Telephone Encounter (Signed)
lmtcb X1 for pt  

## 2014-07-16 NOTE — Telephone Encounter (Signed)
Pt is returning call - [phone]

## 2014-07-17 NOTE — Telephone Encounter (Signed)
Pt aware of results and nothing more needed at this time.

## 2014-12-23 ENCOUNTER — Other Ambulatory Visit: Payer: Self-pay | Admitting: Internal Medicine

## 2014-12-23 DIAGNOSIS — R918 Other nonspecific abnormal finding of lung field: Secondary | ICD-10-CM

## 2015-01-14 ENCOUNTER — Inpatient Hospital Stay: Admission: RE | Admit: 2015-01-14 | Payer: 59 | Source: Ambulatory Visit

## 2015-01-18 ENCOUNTER — Ambulatory Visit (INDEPENDENT_AMBULATORY_CARE_PROVIDER_SITE_OTHER)
Admission: RE | Admit: 2015-01-18 | Discharge: 2015-01-18 | Disposition: A | Discharge status: Home / Self Care | Payer: Medicare Other | Source: Ambulatory Visit | Attending: Internal Medicine | Admitting: Internal Medicine

## 2015-01-18 DIAGNOSIS — R918 Other nonspecific abnormal finding of lung field: Secondary | ICD-10-CM

## 2015-01-18 NOTE — Progress Notes (Signed)
Quick Note:  LMTCB ______ 

## 2015-01-19 ENCOUNTER — Telehealth: Payer: Self-pay | Admitting: Internal Medicine

## 2015-01-19 NOTE — Telephone Encounter (Signed)
Result Notes     Notes Recorded by Len Blalock, CMA on 01/19/2015 at 10:49 AM lmtcb ------  Notes Recorded by Rosana Berger, CMA on 01/18/2015 at 1:51 PM LMTCB ------  Notes Recorded by Tanda Rockers, MD on 01/18/2015 at 1:04 PM Call patient : Study is unremarkable, no change since prev so radiology recs  chest CT is recommended September 2017 placed in tickle     Pt is aware of results. Nothing further was needed.

## 2015-02-02 DIAGNOSIS — H43819 Vitreous degeneration, unspecified eye: Secondary | ICD-10-CM | POA: Insufficient documentation

## 2015-02-02 DIAGNOSIS — M26609 Unspecified temporomandibular joint disorder, unspecified side: Secondary | ICD-10-CM | POA: Insufficient documentation

## 2015-02-02 DIAGNOSIS — M79602 Pain in left arm: Secondary | ICD-10-CM

## 2015-02-02 DIAGNOSIS — I459 Conduction disorder, unspecified: Secondary | ICD-10-CM | POA: Insufficient documentation

## 2015-02-02 DIAGNOSIS — M79601 Pain in right arm: Secondary | ICD-10-CM | POA: Insufficient documentation

## 2015-02-02 DIAGNOSIS — R091 Pleurisy: Secondary | ICD-10-CM | POA: Insufficient documentation

## 2015-02-02 DIAGNOSIS — E785 Hyperlipidemia, unspecified: Secondary | ICD-10-CM | POA: Insufficient documentation

## 2015-02-02 DIAGNOSIS — M7730 Calcaneal spur, unspecified foot: Secondary | ICD-10-CM | POA: Insufficient documentation

## 2015-02-02 DIAGNOSIS — R7301 Impaired fasting glucose: Secondary | ICD-10-CM | POA: Insufficient documentation

## 2015-02-02 DIAGNOSIS — M79673 Pain in unspecified foot: Secondary | ICD-10-CM | POA: Insufficient documentation

## 2015-02-02 DIAGNOSIS — M549 Dorsalgia, unspecified: Secondary | ICD-10-CM | POA: Insufficient documentation

## 2015-02-02 DIAGNOSIS — Z0000 Encounter for general adult medical examination without abnormal findings: Secondary | ICD-10-CM | POA: Insufficient documentation

## 2015-02-02 DIAGNOSIS — K219 Gastro-esophageal reflux disease without esophagitis: Secondary | ICD-10-CM | POA: Insufficient documentation

## 2015-02-02 DIAGNOSIS — R9389 Abnormal findings on diagnostic imaging of other specified body structures: Secondary | ICD-10-CM | POA: Insufficient documentation

## 2015-02-02 DIAGNOSIS — L501 Idiopathic urticaria: Secondary | ICD-10-CM | POA: Insufficient documentation

## 2015-02-02 DIAGNOSIS — M1990 Unspecified osteoarthritis, unspecified site: Secondary | ICD-10-CM | POA: Insufficient documentation

## 2015-02-02 DIAGNOSIS — D759 Disease of blood and blood-forming organs, unspecified: Secondary | ICD-10-CM | POA: Insufficient documentation

## 2015-02-02 DIAGNOSIS — R5383 Other fatigue: Secondary | ICD-10-CM | POA: Insufficient documentation

## 2015-03-29 ENCOUNTER — Emergency Department (HOSPITAL_COMMUNITY)
Admission: EM | Admit: 2015-03-29 | Discharge: 2015-03-29 | Disposition: A | Discharge status: Home / Self Care | Payer: Medicare Other | Attending: Emergency Medicine | Admitting: Emergency Medicine

## 2015-03-29 ENCOUNTER — Encounter (HOSPITAL_COMMUNITY): Payer: Self-pay | Admitting: Family Medicine

## 2015-03-29 ENCOUNTER — Emergency Department (HOSPITAL_COMMUNITY): Payer: Medicare Other

## 2015-03-29 DIAGNOSIS — R002 Palpitations: Secondary | ICD-10-CM

## 2015-03-29 DIAGNOSIS — I499 Cardiac arrhythmia, unspecified: Secondary | ICD-10-CM | POA: Diagnosis present

## 2015-03-29 DIAGNOSIS — E669 Obesity, unspecified: Secondary | ICD-10-CM | POA: Diagnosis not present

## 2015-03-29 DIAGNOSIS — Z79899 Other long term (current) drug therapy: Secondary | ICD-10-CM | POA: Insufficient documentation

## 2015-03-29 DIAGNOSIS — Z7982 Long term (current) use of aspirin: Secondary | ICD-10-CM | POA: Insufficient documentation

## 2015-03-29 DIAGNOSIS — Z9889 Other specified postprocedural states: Secondary | ICD-10-CM | POA: Insufficient documentation

## 2015-03-29 DIAGNOSIS — Z951 Presence of aortocoronary bypass graft: Secondary | ICD-10-CM | POA: Diagnosis not present

## 2015-03-29 DIAGNOSIS — R5383 Other fatigue: Secondary | ICD-10-CM | POA: Insufficient documentation

## 2015-03-29 LAB — CBC
HCT: 45.3 % (ref 36.0–46.0)
HEMOGLOBIN: 15 g/dL (ref 12.0–15.0)
MCH: 32.8 pg (ref 26.0–34.0)
MCHC: 33.1 g/dL (ref 30.0–36.0)
MCV: 98.9 fL (ref 78.0–100.0)
PLATELETS: 239 10*3/uL (ref 150–400)
RBC: 4.58 MIL/uL (ref 3.87–5.11)
RDW: 12.5 % (ref 11.5–15.5)
WBC: 5.4 10*3/uL (ref 4.0–10.5)

## 2015-03-29 LAB — BASIC METABOLIC PANEL
Anion gap: 6 (ref 5–15)
BUN: 8 mg/dL (ref 6–20)
CALCIUM: 9.6 mg/dL (ref 8.9–10.3)
CO2: 29 mmol/L (ref 22–32)
CREATININE: 0.75 mg/dL (ref 0.44–1.00)
Chloride: 105 mmol/L (ref 101–111)
GFR calc Af Amer: >60 mL/min (ref >60)
GFR calc non Af Amer: >60 mL/min (ref >60)
GLUCOSE: 122 mg/dL — AB (ref 65–99)
Potassium: 4 mmol/L (ref 3.5–5.1)
Sodium: 140 mmol/L (ref 135–145)

## 2015-03-29 LAB — I-STAT TROPONIN, ED: TROPONIN I, POC: 0 ng/mL (ref 0.00–0.08)

## 2015-03-29 NOTE — Discharge Instructions (Signed)

## 2015-03-29 NOTE — ED Notes (Signed)
Pt here for irregular HB that started yesterday. Sts also fatigue, occasional sweating and nausea. sts some sharp pains yesterday. sts she took metoprolol and not sleeping well.

## 2015-03-29 NOTE — ED Provider Notes (Signed)
CSN: [id number]     Arrival date & time 03/29/15  7628 History   First MD Initiated Contact with Patient 03/29/15 [phone]     Chief Complaint  Patient presents with  . Irregular Heart Beat     (Consider location/radiation/quality/duration/timing/severity/associated sxs/prior Treatment) HPI Comments: Patient presents to the ER for evaluation of palpitations and irregular heartbeat. She does have a history of same. Patient reports over the last 2 weeks, however, she has been experiencing increased symptoms. Yesterday she had severe symptoms. She reports that her heart would speed up, then slow down, then be hard, then beat soft. She took her Lopressor without any improvement. She reports that she felt severely fatigued and stayed in bed all day yesterday. She woke up this morning with continued symptoms, but they eased off in the car on the way to the ER. She is now symptom free. She reports that in the past she has had these symptoms when she has had increased stress and or caffeine. She denies any caffeine use, does report that she has been under a lot of stress the last 2 weeks.   Past Medical History  Diagnosis Date  . Fatigue   . Palpitations   . Obesity   . MVP (mitral valve prolapse)     OF ANTERIOR MITRAL VALVE LEAFLET   Past Surgical History  Procedure Laterality Date  . Coronary artery bypass graft  08/10/2008  . Stress echo test  05/31/2010    NO ISCHEMIA  . Appendectomy    . Tonsillectomy    . Cardiac catheterization    . Transthoracic echocardiogram  05/16/2010    EF 55-60%   Family History  Problem Relation Age of Onset  . Breast cancer Mother   . Breast cancer Sister    Social History  Substance Use Topics  . Smoking status: Never Smoker   . Smokeless tobacco: Never Used  . Alcohol Use: No   OB History    No data available     Review of Systems  Constitutional: Positive for fatigue.  Cardiovascular: Positive for palpitations.  All other systems reviewed and  are negative.     Allergies  Codeine phosphate; Sulfa antibiotics; and Other  Home Medications   Prior to Admission medications   Medication Sig Start Date End Date Taking? Authorizing Provider  acetaminophen (TYLENOL) 650 MG CR tablet Take 650 mg by mouth daily.   Yes Historical Provider, MD  aspirin EC 81 MG tablet Take 81 mg by mouth once.   Yes Historical Provider, MD  Calcium Carbonate (CALCI-CHEW PO) daily   Yes Historical Provider, MD  cetirizine (ZYRTEC) 10 MG tablet Take 10 mg by mouth daily as needed for allergies.   Yes Historical Provider, MD  cholecalciferol (VITAMIN D) 1000 UNITS tablet Take 1,000 Units by mouth daily.     Yes Historical Provider, MD  Javier Docker Oil 1000 MG CAPS Take 1,000 mg by mouth daily.   Yes Historical Provider, MD  loratadine (CLARITIN) 10 MG tablet Take 10 mg by mouth daily as needed for allergies.   Yes Historical Provider, MD  Melatonin 1 MG TABS Take 2 mg by mouth daily.   Yes Historical Provider, MD  metoprolol succinate (TOPROL-XL) 25 MG 24 hr tablet Take 1 tablet (25 mg total) by mouth daily. 05/19/11  Yes Burtis Junes, NP  multivitamin [facility]) per tablet Take 1 tablet by mouth daily.     Yes Historical Provider, MD   BP 130/77 mmHg  Pulse 66  Temp(Src) 98.1 F (36.7 C)  Resp 15  SpO2 99% Physical Exam  Constitutional: She is oriented to person, place, and time. She appears well-developed and well-nourished. No distress.  HENT:  Head: Normocephalic and atraumatic.  Right Ear: Hearing normal.  Left Ear: Hearing normal.  Nose: Nose normal.  Mouth/Throat: Oropharynx is clear and moist and mucous membranes are normal.  Eyes: Conjunctivae and EOM are normal. Pupils are equal, round, and reactive to light.  Neck: Normal range of motion. Neck supple.  Cardiovascular: Regular rhythm, S1 normal and S2 normal.  Exam reveals no gallop and no friction rub.   No murmur heard. Pulmonary/Chest: Effort normal and breath sounds normal. No  respiratory distress. She exhibits no tenderness.  Abdominal: Soft. Normal appearance and bowel sounds are normal. There is no hepatosplenomegaly. There is no tenderness. There is no rebound, no guarding, no tenderness at McBurney's point and negative Murphy's sign. No hernia.  Musculoskeletal: Normal range of motion.  Neurological: She is alert and oriented to person, place, and time. She has normal strength. No cranial nerve deficit or sensory deficit. Coordination normal. GCS eye subscore is 4. GCS verbal subscore is 5. GCS motor subscore is 6.  Skin: Skin is warm, dry and intact. No rash noted. No cyanosis.  Psychiatric: She has a normal mood and affect. Her speech is normal and behavior is normal. Thought content normal.  Nursing note and vitals reviewed.   ED Course  Procedures (including critical care time) Labs Review Labs Reviewed  BASIC METABOLIC PANEL - Abnormal; Notable for the following:    Glucose, Bld 122 (*)    All other components within normal limits  CBC  I-STAT TROPOININ, ED    Imaging Review Dg Chest 2 View  03/29/2015   CLINICAL DATA:  Heart arrhythmia. Shortness of breath and chest pain for 24 hours  EXAM: CHEST  2 VIEW  COMPARISON:  01/18/2015  FINDINGS: The heart size and mediastinal contours are within normal limits. Right middle lobe pulmonary nodular is unchanged from previous exam. No new findings. The visualized skeletal structures are unremarkable.  IMPRESSION: Large peripheral nodular opacity in the right middle lobe is grossly unchanged from previous exam. As mentioned on the most recent CT report a follow-up chest CT on 03/2016 is recommended to establish a 2 year history of stability and to confirm benignity.   Electronically Signed   By: Kerby Moors M.D.   On: 03/29/2015 08:01   I have personally reviewed and evaluated these images and lab results as part of my medical decision-making.   EKG Interpretation   Date/Time:  Monday March 29 2015  07:21:03 EDT Ventricular Rate:  83 PR Interval:  132 QRS Duration: 76 QT Interval:  362 QTC Calculation: 425 R Axis:   77 Text Interpretation:  Sinus rhythm with marked sinus arrhythmia Otherwise  normal ECG Confirmed by POLLINA  MD, CHRISTOPHER ([id number]) on 03/29/2015  8:48:09 AM      MDM   Final diagnoses:  None   palpitations  Patient presents to the ER for evaluation of palpitations. She does have a history of palpitations. She reports that in the past she has had symptoms in association with stress and caffeine. She has been under increased stress recently. She had symptoms all day yesterday through the day that did not resolve with her Lopressor. She has, however, had spontaneous resolutions morning. Her workup is negative. Her examination is unremarkable. EKG does not show any abnormal findings, has not had any arrhythmia on  the monitor. Patient is appropriate for discharge and outpatient follow-up with cardiology. Return if she has worsening symptoms.    Orpah Greek, MD 03/29/15 [phone]

## 2015-07-21 DIAGNOSIS — R002 Palpitations: Secondary | ICD-10-CM | POA: Insufficient documentation

## 2015-07-21 DIAGNOSIS — M8580 Other specified disorders of bone density and structure, unspecified site: Secondary | ICD-10-CM | POA: Insufficient documentation

## 2015-07-21 DIAGNOSIS — M79603 Pain in arm, unspecified: Secondary | ICD-10-CM | POA: Insufficient documentation

## 2015-07-21 DIAGNOSIS — M79673 Pain in unspecified foot: Secondary | ICD-10-CM | POA: Insufficient documentation

## 2015-07-21 DIAGNOSIS — R399 Unspecified symptoms and signs involving the genitourinary system: Secondary | ICD-10-CM | POA: Insufficient documentation

## 2015-07-21 DIAGNOSIS — G4700 Insomnia, unspecified: Secondary | ICD-10-CM | POA: Insufficient documentation

## 2015-07-23 ENCOUNTER — Telehealth: Payer: Self-pay | Admitting: Cardiology

## 2015-07-23 NOTE — Telephone Encounter (Signed)
Received records from [facility] [place] for appointment on 08/02/15 with Dr Martinique.  Records given to [facility] (medical records) for Dr Doug Sou schedule on 08/02/15.

## 2015-08-02 ENCOUNTER — Encounter: Payer: Self-pay | Admitting: Cardiology

## 2015-08-02 ENCOUNTER — Ambulatory Visit (INDEPENDENT_AMBULATORY_CARE_PROVIDER_SITE_OTHER): Payer: Medicare Other | Admitting: Cardiology

## 2015-08-02 VITALS — BP 112/74 | HR 70 | Ht 65.0 in | Wt 174.0 lb

## 2015-08-02 DIAGNOSIS — I341 Nonrheumatic mitral (valve) prolapse: Secondary | ICD-10-CM

## 2015-08-02 DIAGNOSIS — R002 Palpitations: Secondary | ICD-10-CM | POA: Diagnosis not present

## 2015-08-02 NOTE — Patient Instructions (Signed)
Continue your current therapy  If your symptoms worsen let me know and we could have you wear an event monitor.  We will see you as needed.

## 2015-08-02 NOTE — Progress Notes (Addendum)
Cardiology Office Note   Date:  09/29/2016   ID:  Anabell, Cholico 1950/10/25, MRN [id number]  PCP:  Tula Nakayama  Cardiologist:   Peter Martinique, MD   Chief Complaint  Patient presents with  . New Evaluation  . Palpitations      History of Present Illness: DORRACE ROUSE is a 65 y.o. female who presents for evaluation of palpitations at the request of Bing Matter PA-C. She reports that she has had palpitations since her 11s. Describes this as skipping and sometimes fast or slow heart rate. Clearly exacerbated by stress, fatigue, or caffeine. Previously took Toprol on a prn basis but started taking this daily about 5 years ago due to worsening fatigue related to fibromyalgia and therefore more palpitations. She had a normal cardiac cath in 2010. In 2011 Echo showed mild prolapse of the anterior leaflet- otherwise normal. No history of chest pain, dyspnea, or syncope. She had an episode in September when her fibromyalgia was worse and she couldn't sleep. She got quite fatigued and palpitations returned. She was seen in ED and evaluation there was unremarkable. Usually she can just rest and symptoms improve within an hour. She is active and rides a recumbent bike several days/wk.    Past Medical History:  Diagnosis Date  . Acute otitis media   . Costochondritis   . Enthesopathy of hip region   . Fatigue   . Fibromyalgia   . Helicobacter pylori infection   . Insomnia   . MVP (mitral valve prolapse)    OF ANTERIOR MITRAL VALVE LEAFLET  . Obesity   . Palpitations   . Pharyngitis   . Pneumonia   . Sinusitis, chronic     Past Surgical History:  Procedure Laterality Date  . APPENDECTOMY    . CARDIAC CATHETERIZATION  2010   Normal  . STRESS ECHO TEST  05/31/2010   NO ISCHEMIA  . TONSILLECTOMY    . TRANSTHORACIC ECHOCARDIOGRAM  05/16/2010   EF 55-60%     Current Outpatient Prescriptions  Medication Sig Dispense Refill  . acetaminophen (TYLENOL 8 HOUR  ARTHRITIS PAIN) 650 MG CR tablet Take 650 mg by mouth every 8 (eight) hours as needed.    . Calcium 500 MG CHEW Chew 500 mg by mouth every other day.     . Cetirizine HCl (ZYRTEC ALLERGY) 10 MG CAPS Take 10 mg by mouth as needed.     . cholecalciferol (VITAMIN D) 1000 UNITS tablet Take 1,000 Units by mouth daily.      Javier Docker Oil 1000 MG CAPS Take 1,000 mg by mouth daily.    Marland Kitchen loratadine (CLARITIN) 10 MG tablet Take 10 mg by mouth daily as needed for allergies.    . metoprolol succinate (TOPROL-XL) 25 MG 24 hr tablet Take 1 tablet (25 mg total) by mouth daily. 30 tablet 2  . multivitamin (THERAGRAN) per tablet Take 1 tablet by mouth every other day.     . TURMERIC PO Take 1 capsule by mouth daily.     No current facility-administered medications for this visit.     Allergies:   Codeine phosphate; Other; and Sulfa antibiotics    Social History:  The patient  reports that she has never smoked. She has never used smokeless tobacco. She reports that she does not drink alcohol or use drugs.   Family History:  The patient's family history includes Breast cancer in her mother and sister.    ROS:  Please see the history  of present illness.   Otherwise, review of systems are positive for none.   All other systems are reviewed and negative.    PHYSICAL EXAM: VS:  BP 112/74   Pulse 70   Ht 5\' 5"  (1.651 m)   Wt 174 lb (78.9 kg)   BMI 28.96 kg/m  , BMI Body mass index is 28.96 kg/m. GEN: Well nourished, well developed, in no acute distress HEENT: normal Neck: no JVD, carotid bruits, or masses Cardiac: RRR; no murmurs, rubs, or gallops,no edema  Respiratory:  clear to auscultation bilaterally, normal work of breathing GI: soft, nontender, nondistended, + BS MS: no deformity or atrophy Skin: warm and dry, no rash Neuro:  Strength and sensation are intact Psych: euthymic mood, full affect   EKG:  EKG is not ordered today. The ekg ordered  03/29/15 demonstrates NSR with sinus arrhythmia.  Otherwise normal.   Recent Labs: No results found for requested labs within last 8760 hours.    Lipid Panel    Component Value Date/Time   CHOL  08/10/2008 0020    183        ATP III CLASSIFICATION:  <200     mg/dL   Desirable  200-239  mg/dL   Borderline High  >=240    mg/dL   High          TRIG 94 08/10/2008 0020   HDL 50 08/10/2008 0020   CHOLHDL 3.7 08/10/2008 0020   VLDL 19 08/10/2008 0020   LDLCALC (H) 08/10/2008 0020    114        Total Cholesterol/HDL:CHD Risk Coronary Heart Disease Risk Table                     Men   Women  1/2 Average Risk   3.4   3.3  Average Risk       5.0   4.4  2 X Average Risk   9.6   7.1  3 X Average Risk  23.4   11.0        Use the calculated Patient Ratio above and the CHD Risk Table to determine the patient's CHD Risk.        ATP III CLASSIFICATION (LDL):  <100     mg/dL   Optimal  100-129  mg/dL   Near or Above                    Optimal  130-159  mg/dL   Borderline  160-189  mg/dL   High  >190     mg/dL   Very High      Wt Readings from Last 3 Encounters:  08/02/15 174 lb (78.9 kg)  03/24/14 188 lb (85.3 kg)  02/13/14 187 lb 9.6 oz (85.1 kg)      Other studies Reviewed: Additional studies/ records that were reviewed today include: none. Review of the above records demonstrates: N/A   ASSESSMENT AND PLAN:  1.  Palpitations. Longstanding duration. Clearly benign by history with known triggers. Extensive cardiac evaluation in past with normal cardiac cath in 2010. Mild MV prolapse by prior Echo without murmur. Recommend avoidance of triggers and continued use of Toprol. I see no reason to pursue further cardiac evaluation now. If symptoms worsen in the future we can have her wear an event monitor.   Current medicines are reviewed at length with the patient today.  The patient does not have concerns regarding medicines.  The following changes have been made:  no change  Labs/ tests ordered today include:  No orders  of the defined types were placed in this encounter.    Disposition:   FU prn  Signed, Peter Martinique, MD  09/29/2016 7:05 PM    [facility] [address], [place], [place], [address] Phone [phone], Fax [phone]

## 2016-02-07 ENCOUNTER — Other Ambulatory Visit: Payer: Self-pay | Admitting: Internal Medicine

## 2016-02-07 DIAGNOSIS — R918 Other nonspecific abnormal finding of lung field: Secondary | ICD-10-CM

## 2016-03-13 ENCOUNTER — Inpatient Hospital Stay: Admission: RE | Admit: 2016-03-13 | Payer: Medicare Other | Source: Ambulatory Visit

## 2016-03-15 ENCOUNTER — Ambulatory Visit (INDEPENDENT_AMBULATORY_CARE_PROVIDER_SITE_OTHER)
Admission: RE | Admit: 2016-03-15 | Discharge: 2016-03-15 | Disposition: A | Discharge status: Home / Self Care | Payer: Medicare Other | Source: Ambulatory Visit | Attending: Internal Medicine | Admitting: Internal Medicine

## 2016-03-15 DIAGNOSIS — R918 Other nonspecific abnormal finding of lung field: Secondary | ICD-10-CM | POA: Diagnosis not present

## 2016-03-15 NOTE — Progress Notes (Signed)
Spoke with pt and notified of results per Dr. Wert. Pt verbalized understanding and denied any questions. 

## 2016-07-13 ENCOUNTER — Telehealth: Payer: Self-pay | Admitting: Cardiology

## 2016-07-13 NOTE — Telephone Encounter (Signed)
Returned call to patient she stated she is applying for life insurance.Stated her insurance questioned when she had heart bypass surgery.Stated she has never had heart surgery.Stated her heart cath was normal in 2010.Stated her records were incorrect.She needed to have corrected.Spoke to [place] in our medical records she will look into the matter and call patient.

## 2016-07-13 NOTE — Telephone Encounter (Signed)
Please call,concerning her records.Something in her records seems to be incorrect. Please call him back tomorrow,leaving for the rest of the day.

## 2016-08-16 ENCOUNTER — Telehealth: Payer: Self-pay | Admitting: Cardiology

## 2016-08-16 NOTE — Telephone Encounter (Signed)
A request for Amendment of Health Information received July 31, 2016. Forwarded documents to Minus Breeding MD on August 04, 2016 [employer]

## 2016-08-24 ENCOUNTER — Other Ambulatory Visit: Payer: Self-pay | Admitting: Family Medicine

## 2016-08-24 DIAGNOSIS — Z1231 Encounter for screening mammogram for malignant neoplasm of breast: Secondary | ICD-10-CM

## 2016-09-13 ENCOUNTER — Ambulatory Visit
Admission: RE | Admit: 2016-09-13 | Discharge: 2016-09-13 | Disposition: A | Discharge status: Home / Self Care | Payer: Medicare Other | Source: Ambulatory Visit | Attending: Family Medicine | Admitting: Family Medicine

## 2016-09-13 DIAGNOSIS — Z1231 Encounter for screening mammogram for malignant neoplasm of breast: Secondary | ICD-10-CM

## 2016-09-22 ENCOUNTER — Encounter: Payer: Self-pay | Admitting: Cardiology

## 2016-10-12 ENCOUNTER — Encounter: Payer: Self-pay | Admitting: Internal Medicine

## 2016-10-12 ENCOUNTER — Ambulatory Visit (INDEPENDENT_AMBULATORY_CARE_PROVIDER_SITE_OTHER): Payer: Medicare Other | Admitting: Internal Medicine

## 2016-10-12 VITALS — BP 122/64 | HR 82 | Ht 65.0 in | Wt 176.0 lb

## 2016-10-12 DIAGNOSIS — R918 Other nonspecific abnormal finding of lung field: Secondary | ICD-10-CM | POA: Diagnosis not present

## 2016-10-12 NOTE — Progress Notes (Signed)
Subjective:    Patient ID: Sheri Sims, female    DOB: [birth year]   MRN: [id number]    Brief patient profile:  11 yowf never smoker bad pna age 66 but did fine thereafter except for seasonal allergies responding to otcs with abrupt R side pleuritic cp/ chills Nov 03 2013 > [place]  ER > dx RML pna admit x 2 days > complete resolution of R pain, chills and fever resolved but persistent fatigue and infiltrates RML gradually improving  But still present on  cxr 01/30/14 so referred to pulmonary clinic 02/13/2014 by Dr Sheri Sims    History of Present Illness  02/13/2014 1st [facility] office visit/ Sheri Sims  Chief Complaint  Patient presents with  . Pulmonary Consult    Referred per Dr. Kathryne Sims for eval of abn cxr. Pt states that she was dxed with PNA 4 months ago.  She c/o SOB with walking up and down stairs and if she bends over alot.   overall pattern was very abrupt onset of classic R lat pleuritic cp and much better by the time she was discharged and slow but steady improvement  in all symptoms since then . Never had hemoptysis or purulent sputum - more fatigue limiting activity than sob.  rec No change rx   03/24/2014 f/u ov/Sheri Sims re: f/u ? RML mass  Chief Complaint  Patient presents with  . Followup with CXR    Pt states that she is doing well today and denies any new co's.   Not limited by breathing from desired activities   rec - CT chest  03/31/14 Largely wedge shaped peripheral airspace opacity in the right middle lobe most likely post pneumonic atelectasis.   - CT 07/07/2014 >  01/18/2015 no change  - CT 03/15/2016 1. Essentially stable appearance of an irregular shaped mass-like area in the lateral segment of the right middle lobe, which now demonstrates 2 years of stability. This is strongly favored to represent a benign area, likely chronic mucoid impaction within dilated distal bronchi with surrounding scarring and atelectasis.> no f/u needed unless new  symptoms    10/12/2016  f/u ov/Sheri Sims re: re-establish re RML infiltrates  New location plus prev residual changes  Chief Complaint  Patient presents with  . Acute Visit    last seen 03/2014- pt seen last week by Sheri Sims for PNA. Concern by pt for pleurisy recurrance.   100% prev symptoms resolved , then acutely ill on mid march 2018 sore throat / fever / stuffy nose with yellow discharge / then cough esp in am dark yellow > over about 10 days improved  > minimal residual cough never received abx  Then acutely new location (from prev presumed pna) R pleuritic  cp near previous location then CTa  10/06/16  Sheri Sims showed same are smaller plus new area on pna > rx IV abx /levaquin x 10 days planned new Lie L side down hurts on R - overall pain only sltly better on abx than at its peak 10/06/16 and no longer hurts to breath deeply  Taking advil 200 up to one daily only / has stronger pain pill but not taking and doesn't know the name  No obvious day to day or daytime variability or assoc excess/ purulent sputum or mucus plugs or hemoptysis or cp or chest tightness, subjective wheeze or overt sinus or hb symptoms. No unusual exp hx or h/o childhood pna/ asthma or knowledge of premature birth.  Sleeping ok without nocturnal  or early am exacerbation  of respiratory  c/o's or need for noct saba. Also denies any obvious fluctuation of symptoms with weather or environmental changes or other aggravating or alleviating factors except as outlined above   Current Medications, Allergies, Complete Past Medical History, Past Surgical History, Family History, and Social History were reviewed in [employer] record.  ROS  The following are not active complaints unless bolded sore throat, dysphagia, dental problems, itching, sneezing,  nasal congestion or excess/ purulent secretions, ear ache,   fever, chills, sweats, unintended wt loss, classically  exertional cp,  orthopnea pnd or leg swelling,  presyncope, palpitations, abdominal pain, anorexia, nausea, vomiting, diarrhea  or change in bowel or bladder habits, change in stools or urine, dysuria,hematuria,  rash, arthralgias, visual complaints, headache, numbness, weakness or ataxia or problems with walking or coordination,  change in mood/affect or memory.              Objective:   Physical Exam  amb wf nad t failed to answer a  questions asked in a straightforward manner, tending to   answer questions with ambiguous medical terms or diagnoses "I had a viral pneumonia and now I have pleurisy"   Vital signs reviewed  - Note on arrival 02 sats  98% on RA      10/12/2016       176  03/24/14           188     02/13/14 187 lb 9.6 oz (85.095 kg)      HEENT: nl dentition, turbinates, and orophanx. Nl external ear canals without cough reflex   NECK :  without JVD/Nodes/TM/ nl carotid upstrokes bilaterally   LUNGS: no acc muscle use, clear to A and P bilaterally without cough on insp or exp maneuvers   CV:  RRR  no s3 or murmur or increase in P2, no edema   ABD:  soft and nontender with nl excursion in the supine position. No bruits or organomegaly, bowel sounds nl  MS:  warm without deformities, calf tenderness, cyanosis or clubbing  SKIN: warm and dry without lesions    NEURO:  alert, approp, no deficits      I personally reviewed images and agree with radiology impression as follows:  CTa Chest  10/06/16 1.No pulmonary embolism. 2.Focal consolidation in the right upper lobe most likely pneumonia. 3.Slightly decreased size of a masslike opacity in the right middle lobe compared to CT from 2015. This is favored to represent sequela of prior infection.           Assessment & Plan:

## 2016-10-12 NOTE — Patient Instructions (Addendum)
Finish your antibiotics as planned  Take advil up to 3 with meals as needed for pain with breathing or coughing or different positions  Please see patient coordinator before you leave today  to schedule CT with contrast in one month and ov same day

## 2016-10-13 ENCOUNTER — Encounter: Payer: Self-pay | Admitting: Internal Medicine

## 2016-10-13 NOTE — Assessment & Plan Note (Addendum)
-  acute onset May 2015 RML  - ESR 02/13/2014 = 20 - CT chest  03/31/14 Largely wedge shaped peripheral airspace opacity in the right middle lobe most likely post pneumonic atelectasis.   - CT 07/07/2014 >  01/18/2015 no change  - CT 03/15/2016 1. Essentially stable appearance of an irregular shaped mass-like area in the lateral segment of the right middle lobe, which now demonstrates 2 years of stability. This is strongly favored to represent a benign area, likely chronic mucoid impaction within dilated distal bronchi with surrounding scarring and atelectasis.> no f/u needed unless new symptoms - CTa 10/06/16 1.No pulmonary embolism. 2.Focal consolidation in the right upper lobe most likely pneumonia. 3.Slightly decreased size of a masslike opacity in the right middle lobe compared to CT from 2015. This is favored to represent sequela of prior infection.  There appear to be 2 different areas in RML c/w RML syndrome both flared with similar clinical syndromes (except the pain for the second is sltly different location)  c/w bacterial, not viral pna (though certainly could have started with viral uri) and rx with levaquin approp here.  In absence of assoc pleural effusion best rx otherwise is just to treat pleuritis with advil up to 600 mg tid with meals prn and return at 4 weeks for repeat CT chest to be sure about both infiltrates but most likely they are both the result of RML syndrome of a benign nature.  Discussed in detail all the  indications, usual  risks and alternatives  relative to the benefits with patient who agrees to proceed with conservative f/u as outlined    I had an extended discussion with the patient reviewing all relevant studies completed to date and  lasting 25 minutes of a 40  minute acute  visit to re-establish and address new     non-specific but potentially very serious refractory respiratory symptoms of unknown etiology.  Each maintenance medication was reviewed in  detail including most importantly the difference between maintenance and prns and under what circumstances the prns are to be triggered using an action plan format that is not reflected in the computer generated alphabetically organized AVS.    Please see AVS for specific instructions unique to this office visit that I personally wrote and verbalized to the the pt in detail and then reviewed with pt  by my nurse highlighting any changes in therapy/plan of care  recommended at today's visit.

## 2016-10-26 ENCOUNTER — Telehealth: Payer: Self-pay | Admitting: Internal Medicine

## 2016-10-26 NOTE — Telephone Encounter (Signed)
Patient calling back - she can be reached at [phone] -pr

## 2016-10-26 NOTE — Telephone Encounter (Signed)
MW  Please Advise-  Pt called in concerned because she is still having trouble with the pleurisy. She states she is unable to take up to the 3 Advil that you wanted her, feels like it causing her stomach trouble. She is c/o back pain, lower rib pain, pain with deep breath. She wanted to know what else she could take for the pain.   Instructions   Finish your antibiotics as planned  Take advil up to 3 with meals as needed for pain with breathing or coughing or different positions  Please see patient coordinator before you leave today  to schedule CT with contrast in one month and ov same day

## 2016-10-26 NOTE — Telephone Encounter (Signed)
lmtcb for pt.  

## 2016-10-26 NOTE — Telephone Encounter (Signed)
Patient returned phone call. °

## 2016-10-26 NOTE — Telephone Encounter (Signed)
Notes say she has another pain pill she didn't identify and if she has it ok to use it but in meantime would move up the f/u CT chest to do by 4/27 pm if possible and I will call to discuss results

## 2016-10-26 NOTE — Telephone Encounter (Signed)
I called and spoke with the pt and notified of recs per MW  She verbalized understanding  She states would like to move CT up sooner  I have called Marzetta Board and she is going to have the CT moved to tomorrow  Pt aware and nothing further needed Will call her with results once recieved

## 2016-10-27 ENCOUNTER — Ambulatory Visit (INDEPENDENT_AMBULATORY_CARE_PROVIDER_SITE_OTHER)
Admission: RE | Admit: 2016-10-27 | Discharge: 2016-10-27 | Disposition: A | Discharge status: Home / Self Care | Payer: Medicare Other | Source: Ambulatory Visit | Attending: Internal Medicine | Admitting: Internal Medicine

## 2016-10-27 DIAGNOSIS — R918 Other nonspecific abnormal finding of lung field: Secondary | ICD-10-CM | POA: Diagnosis not present

## 2016-10-27 NOTE — Progress Notes (Signed)
LMTCB

## 2016-11-09 ENCOUNTER — Other Ambulatory Visit: Payer: Medicare Other

## 2016-11-15 ENCOUNTER — Encounter: Payer: Self-pay | Admitting: Internal Medicine

## 2016-11-15 ENCOUNTER — Ambulatory Visit (INDEPENDENT_AMBULATORY_CARE_PROVIDER_SITE_OTHER): Payer: Medicare Other | Admitting: Internal Medicine

## 2016-11-15 ENCOUNTER — Other Ambulatory Visit: Payer: Medicare Other

## 2016-11-15 VITALS — BP 118/68 | HR 77 | Ht 65.0 in | Wt 179.2 lb

## 2016-11-15 DIAGNOSIS — R918 Other nonspecific abnormal finding of lung field: Secondary | ICD-10-CM | POA: Diagnosis not present

## 2016-11-15 NOTE — Patient Instructions (Addendum)
No further pulmonary follow up is needed - return for worsening cough or pain on breathing

## 2016-11-15 NOTE — Assessment & Plan Note (Signed)
-  acute onset May 2015 RML  - ESR 02/13/2014 = 20 - CT chest  03/31/14 Largely wedge shaped peripheral airspace opacity in the right middle lobe most likely post pneumonic atelectasis.   - CT 07/07/2014 >  01/18/2015 no change  - CT 03/15/2016 1. Essentially stable appearance of an irregular shaped mass-like area in the lateral segment of the right middle lobe, which now demonstrates 2 years of stability. This is strongly favored to represent a benign area, likely chronic mucoid impaction within dilated distal bronchi with surrounding scarring and atelectasis.> no f/u needed unless new symptoms - CTa 10/06/16 1.No pulmonary embolism. 2.Focal consolidation in the right upper lobe most likely pneumonia. 3.Slightly decreased size of a masslike opacity in the right middle lobe compared to CT from 2015. This is favored to represent sequela of prior infection.  - HRCT  10/27/2016  Lesion is well demonstrated on coronal and sagittal reconstructions and when comparing to 03/31/2014, the lesion measures 1.2 x 1.8 x 2.4 cm today compared to 1.5 x 2.2 x 2.6 cm on 03/31/2014.  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

## 2016-11-15 NOTE — Progress Notes (Signed)
Subjective:    Patient ID: KIRBY ARGUETA, female    DOB: 05/15/1951   MRN: [id number]    Brief patient profile:  66 yowf never smoker bad pna age 66 but did fine thereafter except for seasonal allergies responding to otcs with abrupt R side pleuritic cp/ chills Nov 03 2013 > [place]  ER > dx RML pna admit x 2 days > complete resolution of R pain, chills and fever resolved but persistent fatigue and infiltrates RML gradually improving  But still present on  cxr 01/30/14 so referred to pulmonary clinic 02/13/2014 by Dr Kathryne Eriksson    History of Present Illness  02/13/2014 1st [facility] office visit/ [facility]  Chief Complaint  Patient presents with  . Pulmonary Consult    Referred per Dr. Kathryne Eriksson for eval of abn cxr. Pt states that she was dxed with PNA 4 months ago.  She c/o SOB with walking up and down stairs and if she bends over alot.   overall pattern was very abrupt onset of classic R lat pleuritic cp and much better by the time she was discharged and slow but steady improvement  in all symptoms since then . Never had hemoptysis or purulent sputum - more fatigue limiting activity than sob.  rec No change rx   03/24/2014 f/u ov/Wert re: f/u ? RML mass  Chief Complaint  Patient presents with  . Followup with CXR    Pt states that she is doing well today and denies any new co's.   Not limited by breathing from desired activities   rec - CT chest  03/31/14 Largely wedge shaped peripheral airspace opacity in the right middle lobe most likely post pneumonic atelectasis.   - CT 07/07/2014 >  01/18/2015 no change  - CT 03/15/2016 1. Essentially stable appearance of an irregular shaped mass-like area in the lateral segment of the right middle lobe, which now demonstrates 2 years of stability. This is strongly favored to represent a benign area, likely chronic mucoid impaction within dilated distal bronchi with surrounding scarring and atelectasis.> no f/u needed unless new  symptoms    10/12/2016  f/u ov/Wert re: re-establish re RML infiltrates  New location plus prev residual changes  Chief Complaint  Patient presents with  . Acute Visit    last seen 03/2014- pt seen last week by [facility] for PNA. Concern by pt for pleurisy recurrance.   100% prev symptoms resolved , then acutely ill on mid march 2018 sore throat / fever / stuffy nose with yellow discharge / then cough esp in am dark yellow > over about 10 days improved  > minimal residual cough never received abx  Then acutely new location (from prev presumed pna) R pleuritic  cp near previous location then CTa  10/06/16  [facility] Se[facility] showed same are smaller plus new area on pna > rx IV abx /levaquin x 10 days planned new Lie L side down hurts on R - overall pain only sltly better on abx than at its peak 10/06/16 and no longer hurts to breath deeply  Taking advil 200 up to one daily only / has stronger pain pill but not taking and doesn't know the name rec Finish your antibiotics as planned Take advil up to 3 with meals as needed for pain with breathing or coughing or different positions Please see patient coordinator before you leave today  to schedule CT with contrast in one month and ov same day      11/15/2016  f/u ov/Wert re: RML syndrome/ fibromyalgia  Chief Complaint  Patient presents with  . Follow-up    Discuss CT Chest. She only c/o occ cough. No new co's today.      Not limited by breathing from desired activities/ cough is minimal dry     No obvious day to day or daytime variability or assoc excess/ purulent sputum or mucus plugs or hemoptysis or cp or chest tightness, subjective wheeze or overt sinus or hb symptoms. No unusual exp hx or h/o childhood pna/ asthma or knowledge of premature birth.  Sleeping ok without nocturnal  or early am exacerbation  of respiratory  c/o's or need for noct saba. Also denies any obvious fluctuation of symptoms with weather or environmental changes or other aggravating or  alleviating factors except as outlined above   Current Medications, Allergies, Complete Past Medical History, Past Surgical History, Family History, and Social History were reviewed in [employer] record.  ROS  The following are not active complaints unless bolded sore throat, dysphagia, dental problems, itching, sneezing,  nasal congestion or excess/ purulent secretions, ear ache,   fever, chills, sweats, unintended wt loss, classically pleuritic or exertional cp,  orthopnea pnd or leg swelling, presyncope, palpitations, abdominal pain, anorexia, nausea, vomiting, diarrhea  or change in bowel or bladder habits, change in stools or urine, dysuria,hematuria,  rash, arthralgias, visual complaints, headache, numbness, weakness or ataxia or problems with walking or coordination,  change in mood/affect or memory.                      Objective:   Physical Exam  amb wf nad     Vital signs reviewed  - Note on arrival 02 sats  97% on RA      11/15/2016       179  10/12/2016       176  03/24/14           188     02/13/14 187 lb 9.6 oz (85.095 kg)      HEENT: nl dentition, turbinates, and orophanx. Nl external ear canals without cough reflex   NECK :  without JVD/Nodes/TM/ nl carotid upstrokes bilaterally   LUNGS: no acc muscle use, clear to A and P bilaterally without cough on insp or exp maneuvers   CV:  RRR  no s3 or murmur or increase in P2, no edema   ABD:  soft and nontender with nl excursion in the supine position. No bruits or organomegaly, bowel sounds nl  MS:  warm without deformities, calf tenderness, cyanosis or clubbing  SKIN: warm and dry without lesions    NEURO:  alert, approp, no deficits     I personally reviewed images and agree with radiology impression as follows:  CT 10/27/16 Chest  w/o contrast  1. Continued stability of the relatively well-defined nodular density in the posterior right middle lobe, suggesting  benign etiology. 2. Interval development of clustered peribronchovascular nodularity peripheral aspect of the posterior right lower lobe. Imaging features highly suggestive of atypical infection.             Assessment & Plan:

## 2017-01-15 ENCOUNTER — Ambulatory Visit
Admission: RE | Admit: 2017-01-15 | Discharge: 2017-01-15 | Disposition: A | Discharge status: Home / Self Care | Payer: Self-pay | Source: Ambulatory Visit | Attending: Internal Medicine | Admitting: Internal Medicine

## 2017-01-15 ENCOUNTER — Other Ambulatory Visit: Payer: Self-pay | Admitting: Internal Medicine

## 2017-01-15 DIAGNOSIS — R918 Other nonspecific abnormal finding of lung field: Secondary | ICD-10-CM

## 2017-04-03 ENCOUNTER — Ambulatory Visit
Admission: RE | Admit: 2017-04-03 | Discharge: 2017-04-03 | Disposition: A | Discharge status: Home / Self Care | Payer: Self-pay | Source: Ambulatory Visit | Attending: Internal Medicine | Admitting: Internal Medicine

## 2017-04-03 ENCOUNTER — Other Ambulatory Visit: Payer: Self-pay | Admitting: Internal Medicine

## 2017-04-03 DIAGNOSIS — R918 Other nonspecific abnormal finding of lung field: Secondary | ICD-10-CM

## 2017-07-09 NOTE — Progress Notes (Signed)
Cardiology Office Note   Date:  07/11/2017   ID:  Sheri Sims, DOB April 16, 1951, MRN [id number]  PCP:  Aletha Halim., PA-C  Cardiologist:  Peter Martinique, MD  Chief Complaint  Patient presents with  . Palpitations    having palpitations mainly at night, skipping and fluttering, having episodes more frequently     History of Present Illness: Sheri Sims is a 67 y.o. female who presents for ongoing assessment and management of palpitations.  The patient was last in the office by Dr. Martinique on 08/02/2015.  Other history includes bradycardia, fibromyalgia,   Patient had a normal cardiac catheterization in 2010, echocardiogram in 2011 showed mild prolapse of the anterior leaflet-otherwise normal.  The patient comes today with a lengthy list of symptoms.  She states that the majority of her symptoms began late spring after having a bout of pneumonia.  She had been noticing worsening fatigue and palpitations after that.  She also stated that her breathing status never fully came back to normal after having pneumonia.  Over the last couple of months she has been noticing worsening palpitations which awaken her at nighttime.  She states they would go on for 10 minutes or so and then subside on their own.  The patient's breathing status and fatigue have also worsened over the last couple of months.  She states at one point her heart was racing so hard that she chose to go to the emergency room but when she arrived there it had stopped prior to walking in.  Over the last week or so she is experiencing palpitations almost daily, either fast heart rate or frequent thumping.  Her fatigue has worsened.  Past Medical History:  Diagnosis Date  . Acute otitis media   . Costochondritis   . Enthesopathy of hip region   . Fatigue   . Fibromyalgia   . Helicobacter pylori infection   . Insomnia   . MVP (mitral valve prolapse)    OF ANTERIOR MITRAL VALVE LEAFLET  . Obesity   . Palpitations   .  Pharyngitis   . Pneumonia   . Sinusitis, chronic     Past Surgical History:  Procedure Laterality Date  . APPENDECTOMY    . CARDIAC CATHETERIZATION  2010   Normal  . STRESS ECHO TEST  05/31/2010   NO ISCHEMIA  . TONSILLECTOMY    . TRANSTHORACIC ECHOCARDIOGRAM  05/16/2010   EF 55-60%     Current Outpatient Medications  Medication Sig Dispense Refill  . acetaminophen (TYLENOL 8 HOUR ARTHRITIS PAIN) 650 MG CR tablet Take 650 mg by mouth every 8 (eight) hours as needed.    Azucena Freed Serrata (BOSWELLIA PO) Take 1 capsule by mouth at bedtime.    . Calcium 500 MG CHEW Chew 500 mg by mouth every other day.     . Cetirizine HCl (ZYRTEC ALLERGY) 10 MG CAPS Take 10 mg by mouth as needed.     . cholecalciferol (VITAMIN D) 1000 UNITS tablet Take 1,000 Units by mouth daily.      Marland Kitchen ibuprofen (ADVIL,MOTRIN) 200 MG tablet Take 200 mg by mouth every 6 (six) hours as needed.    . loratadine (CLARITIN) 10 MG tablet Take 10 mg by mouth daily as needed for allergies.    . metoprolol succinate (TOPROL-XL) 25 MG 24 hr tablet Take 1 tablet (25 mg total) by mouth daily. 30 tablet 2  . multivitamin (THERAGRAN) per tablet Take 1 tablet by mouth every other day.     Marland Kitchen  OVER THE COUNTER MEDICATION Take 1 capsule by mouth at bedtime.    . TURMERIC PO Take 1 capsule by mouth daily.     No current facility-administered medications for this visit.     Allergies:   Codeine phosphate; Other; and Sulfa antibiotics    Social History:  The patient  reports that  has never smoked. she has never used smokeless tobacco. She reports that she does not drink alcohol or use drugs.   Family History:  The patient's family history includes Breast cancer in her mother and sister.    ROS: All other systems are reviewed and negative. Unless otherwise mentioned in H&P    PHYSICAL EXAM: VS:  BP 118/82   Pulse 67   Ht 5' 5.5" (1.664 m)   Wt 179 lb 12.8 oz (81.6 kg)   BMI 29.47 kg/m  , BMI Body mass index is 29.47  kg/m. GEN: Well nourished, well developed, in no acute distress  HEENT: normal  Neck: no JVD, carotid bruits, or masses Cardiac: RRR; occasional extrasystole no murmurs, rubs, or gallops,no edema  Respiratory:  clear to auscultation bilaterally, normal work of breathing GI: soft, nontender, nondistended, + BS MS: no deformity or atrophy  Skin: warm and dry, no rash Neuro:  Strength and sensation are intact Psych: euthymic mood, full affect   EKG: Normal sinus rhythm, heart rate 67 bpm with occasional PACs Recent Labs: No results found for requested labs within last 8760 hours.    Lipid Panel    Component Value Date/Time   CHOL  08/10/2008 0020    183        ATP III CLASSIFICATION:  <200     mg/dL   Desirable  200-239  mg/dL   Borderline High  >=240    mg/dL   High          TRIG 94 08/10/2008 0020   HDL 50 08/10/2008 0020   CHOLHDL 3.7 08/10/2008 0020   VLDL 19 08/10/2008 0020   LDLCALC (H) 08/10/2008 0020    114        Total Cholesterol/HDL:CHD Risk Coronary Heart Disease Risk Table                     Men   Women  1/2 Average Risk   3.4   3.3  Average Risk       5.0   4.4  2 X Average Risk   9.6   7.1  3 X Average Risk  23.4   11.0        Use the calculated Patient Ratio above and the CHD Risk Table to determine the patient's CHD Risk.        ATP III CLASSIFICATION (LDL):  <100     mg/dL   Optimal  100-129  mg/dL   Near or Above                    Optimal  130-159  mg/dL   Borderline  160-189  mg/dL   High  >190     mg/dL   Very High      Wt Readings from Last 3 Encounters:  07/11/17 179 lb 12.8 oz (81.6 kg)  11/15/16 179 lb 3.2 oz (81.3 kg)  10/12/16 176 lb (79.8 kg)      ASSESSMENT AND PLAN:  1.  Fatigue: The patient has not had any prior ischemic testing in the last 10 years.  The patient has been having worsening fatigue,  and dyspnea.  Cardiovascular risk factors are very low with no history of hypertension, family history of CAD, thyroid disease  or hypercholesterolemia.  However with her recurrent symptoms I am going to have a nuclear medicine stress Myoview completed for diagnostic prognostic purposes.  An echocardiogram will also be completed.  2.  Frequent palpitations: The patient states that she has been having worsening palpitations over the last couple of weeks sometimes awaking her at night with racing heart rate causing her to have some shortness of breath.  She remains on metoprolol 25 mg XL at nighttime.  Currently her heart rate is well controlled.  EKG does not show acute ST-T wave abnormalities.  We will place a 30 day  cardiac monitor on the patient for evaluation of frequency and duration of self reported palpitations.  I will check a BMET, CBC, and a TSH for underlying reasons for abnormalities in her heart rate.   Current medicines are reviewed at length with the patient today.    Labs/ tests ordered today include: Nuclear medicine stress test, echocardiogram, and BMET, TSH, and CBC Phill Myron. West Pugh, ANP, AACC   07/11/2017 9:03 AM    [facility] [address] [place], [place] [address] Phone: [phone]; Fax: [phone]

## 2017-07-11 ENCOUNTER — Encounter: Payer: Self-pay | Admitting: Adult Health

## 2017-07-11 ENCOUNTER — Ambulatory Visit: Payer: Medicare Other | Admitting: Adult Health

## 2017-07-11 VITALS — BP 118/82 | HR 67 | Ht 65.5 in | Wt 179.8 lb

## 2017-07-11 DIAGNOSIS — R5383 Other fatigue: Secondary | ICD-10-CM | POA: Diagnosis not present

## 2017-07-11 DIAGNOSIS — R0600 Dyspnea, unspecified: Secondary | ICD-10-CM

## 2017-07-11 DIAGNOSIS — Z79899 Other long term (current) drug therapy: Secondary | ICD-10-CM | POA: Diagnosis not present

## 2017-07-11 DIAGNOSIS — R002 Palpitations: Secondary | ICD-10-CM

## 2017-07-11 NOTE — Patient Instructions (Signed)
Medication Instructions:  NO CHANGES-Your physician recommends that you continue on your current medications as directed. Please refer to the Current Medication list given to you today.  If you need a refill on your cardiac medications before your next appointment, please call your pharmacy.  Labwork: BMET, MAG, TSH AND CBC TODAY HERE IN OUR OFFICE AT [facility]  Testing/Procedures: Echocardiogram - Your physician has requested that you have an echocardiogram. Echocardiography is a painless test that uses sound waves to create images of your heart. It provides your doctor with information about the size and shape of your heart and how well your heart's chambers and valves are working. This procedure takes approximately one hour. There are no restrictions for this procedure. This will be performed at our [facility] location - [address], [address].  Your physician has requested that you have a stress myoview. A Myoview stress test is used to check for coronary disease. During the procedure, a small amount of radioactive isotope called Myoview is injected into a vein, one injection is given through an IV when you first begin the exam called resting images, and a second injection is given while you exercise on a treadmill. The Myoview circulates in your bloodstream and is absorbed by your heart while a special camera takes pictures that show whether your heart is receiving enough blood and oxygen. MAKE SURE TO HOLD METOPROLOL THE MORNING OF THE STRESS TESING  Your physician has recommended that you wear an event monitor-for 30 days. Event monitors are medical devices that record the heart's electrical activity. Doctors most often [place] these monitors to diagnose arrhythmias. Arrhythmias are problems with the speed or rhythm of the heartbeat. The monitor is a small, portable device. You can wear one while you do your normal daily activities. This is usually used to diagnose what is causing  palpitations/syncope (passing out).  Special Instructions:  MAKE SURE TO HOLD METOPROLOL THE MORNING OF THE STRESS TESING  Follow-Up: Your physician wants you to follow-up in: AFTER TESTING/MONITOR WITH DR Martinique -OR- KATHRYN LAWRENCE, DNP.   Thank you for choosing [facility] at [facility]!!

## 2017-07-12 LAB — CBC
HEMOGLOBIN: 14.3 g/dL (ref 11.1–15.9)
Hematocrit: 44.8 % (ref 34.0–46.6)
MCH: 31.5 pg (ref 26.6–33.0)
MCHC: 31.9 g/dL (ref 31.5–35.7)
MCV: 99 fL — AB (ref 79–97)
Platelets: 241 10*3/uL (ref 150–379)
RBC: 4.54 x10E6/uL (ref 3.77–5.28)
RDW: 12.9 % (ref 12.3–15.4)
WBC: 4.4 10*3/uL (ref 3.4–10.8)

## 2017-07-12 LAB — BASIC METABOLIC PANEL
BUN/Creatinine Ratio: 20 (ref 12–28)
BUN: 12 mg/dL (ref 8–27)
CO2: 25 mmol/L (ref 20–29)
CREATININE: 0.6 mg/dL (ref 0.57–1.00)
Calcium: 9.4 mg/dL (ref 8.7–10.3)
Chloride: 103 mmol/L (ref 96–106)
GFR calc non Af Amer: 94 mL/min/{1.73_m2} (ref >59)
GFR, EST AFRICAN AMERICAN: 108 mL/min/{1.73_m2} (ref >59)
GLUCOSE: 70 mg/dL (ref 65–99)
Potassium: 5.2 mmol/L (ref 3.5–5.2)
SODIUM: 141 mmol/L (ref 134–144)

## 2017-07-12 LAB — TSH: TSH: 2.28 u[IU]/mL (ref 0.450–4.500)

## 2017-07-12 LAB — MAGNESIUM: MAGNESIUM: 2.5 mg/dL — AB (ref 1.6–2.3)

## 2017-07-13 ENCOUNTER — Telehealth (HOSPITAL_COMMUNITY): Payer: Self-pay

## 2017-07-13 NOTE — Telephone Encounter (Signed)
Encounter complete. 

## 2017-07-18 ENCOUNTER — Ambulatory Visit (HOSPITAL_COMMUNITY)
Admission: RE | Admit: 2017-07-18 | Discharge: 2017-07-18 | Disposition: A | Discharge status: Home / Self Care | Payer: Medicare Other | Source: Ambulatory Visit | Attending: Cardiology | Admitting: Cardiology

## 2017-07-18 DIAGNOSIS — R0600 Dyspnea, unspecified: Secondary | ICD-10-CM | POA: Diagnosis not present

## 2017-07-18 DIAGNOSIS — R5383 Other fatigue: Secondary | ICD-10-CM | POA: Insufficient documentation

## 2017-07-18 DIAGNOSIS — R002 Palpitations: Secondary | ICD-10-CM | POA: Insufficient documentation

## 2017-07-18 LAB — MYOCARDIAL PERFUSION IMAGING
CHL CUP MPHR: 152 {beats}/min
CHL CUP RESTING HR STRESS: 62 {beats}/min
CSEPED: 8 min
CSEPEDS: 1 s
Estimated workload: 10.1 METS
LV dias vol: 52 mL (ref 46–106)
LV sys vol: 15 mL
NUC STRESS TID: 1.18
Peak HR: 146 {beats}/min
Percent HR: 96 %
RPE: 18
SDS: 0
SRS: 0
SSS: 0

## 2017-07-18 MED ORDER — TECHNETIUM TC 99M TETROFOSMIN IV KIT
32.0000 | PACK | Freq: Once | INTRAVENOUS | Status: AC | PRN
  Administered 2017-07-18: 32 via INTRAVENOUS
  Filled 2017-07-18: qty 32

## 2017-07-18 MED ORDER — TECHNETIUM TC 99M TETROFOSMIN IV KIT
10.6000 | PACK | Freq: Once | INTRAVENOUS | Status: AC | PRN
  Administered 2017-07-18: 10.6 via INTRAVENOUS
  Filled 2017-07-18: qty 11

## 2017-07-19 ENCOUNTER — Ambulatory Visit (INDEPENDENT_AMBULATORY_CARE_PROVIDER_SITE_OTHER): Payer: Medicare Other

## 2017-07-19 ENCOUNTER — Other Ambulatory Visit: Payer: Self-pay

## 2017-07-19 ENCOUNTER — Ambulatory Visit (HOSPITAL_COMMUNITY): Payer: Medicare Other | Attending: Cardiovascular Disease

## 2017-07-19 DIAGNOSIS — R002 Palpitations: Secondary | ICD-10-CM | POA: Diagnosis not present

## 2017-07-19 DIAGNOSIS — R5383 Other fatigue: Secondary | ICD-10-CM | POA: Diagnosis not present

## 2017-07-19 DIAGNOSIS — I5030 Unspecified diastolic (congestive) heart failure: Secondary | ICD-10-CM | POA: Insufficient documentation

## 2017-07-19 DIAGNOSIS — I071 Rheumatic tricuspid insufficiency: Secondary | ICD-10-CM | POA: Insufficient documentation

## 2017-07-19 DIAGNOSIS — R0600 Dyspnea, unspecified: Secondary | ICD-10-CM | POA: Diagnosis present

## 2017-07-29 ENCOUNTER — Telehealth: Payer: Self-pay | Admitting: Physician Assistant

## 2017-07-29 NOTE — Telephone Encounter (Signed)
Received notification from on-call cardiologist at sign out this morning that the patient's event monitor demonstrated atrial fibrillation in the middle of the night.  I have not seen the strips.  Telemetry strips are to be faxed to our office.  I try to contact the patient this morning.  I left a message for her to call us back.  PLAN: She will need follow-up this week. CHADS2-VASc= 2 (female, age 67).  If atrial fibrillation confirmed on telemetry, she will need anticoagulation started. I will leave a message to have the patient brought in for an appointment at the [facility] office or refer her to the atrial fibrillation clinic. Richardson Dopp, PA-C    07/29/2017 10:38 AM

## 2017-07-29 NOTE — Telephone Encounter (Signed)
Patient return phone call from earlier today.  She did feel palpitations in the middle of the night and activated the event monitor.  She has not felt any further symptoms since that time.  She does feel tired.  This is fairly typical for her after an episode of palpitations.  She denies chest pain, shortness of breath, dizziness or syncope. PLAN: As noted, I have left a message for the office to contact her tomorrow to arrange follow-up, review her telemetry strips and make a decision regarding anticoagulation therapy. Richardson Dopp, PA-C    07/29/2017 1:45 PM

## 2017-07-30 NOTE — Telephone Encounter (Signed)
Confirmed atrial fib on monitor strips. Spoke with pt, Follow up scheduled with dr Martinique tomorrw.

## 2017-07-31 ENCOUNTER — Ambulatory Visit: Payer: Medicare Other | Admitting: Cardiology

## 2017-07-31 ENCOUNTER — Encounter: Payer: Self-pay | Admitting: Cardiology

## 2017-07-31 VITALS — BP 124/76 | HR 76 | Ht 65.0 in | Wt 177.0 lb

## 2017-07-31 DIAGNOSIS — I480 Paroxysmal atrial fibrillation: Secondary | ICD-10-CM

## 2017-07-31 MED ORDER — RIVAROXABAN 20 MG PO TABS: 20.0000 mg | ORAL_TABLET | Freq: Every day | ORAL | 11 refills | Status: DC

## 2017-07-31 NOTE — Patient Instructions (Signed)
Start Xarelto 20 mg daily   Avoid ASA and NSAIDs like advil, ibuprofen, etc.

## 2017-07-31 NOTE — Progress Notes (Signed)
Cardiology Office Note   Date:  07/31/2017   ID:  Georgiann, Neider 1951-04-21, MRN [id number]  PCP:  Aletha Halim., PA-C  Cardiologist:   Peter Martinique, MD   Chief Complaint  Patient presents with  . Follow-up  . Atrial Fibrillation      History of Present Illness: Sheri Sims is a 67 y.o. female who is seen for follow up  of palpitations and fatigue. She was last seen by me in 2017. She reports that she has had palpitations since her 7s. Describes this as skipping and sometimes fast or slow heart rate. Clearly exacerbated by stress, fatigue, or caffeine. Previously took Toprol on a prn basis but started taking this daily about 5 years ago due to worsening fatigue related to fibromyalgia and therefore more palpitations. She had a normal cardiac cath in 2010. In 2011 Echo showed mild prolapse of the anterior leaflet- otherwise normal. No history of chest pain, dyspnea, or syncope. In 2017 we recommended continued use of prn Toprol.   Seen more recently by Jory Sims NP for symptoms of increased palpitations and fatigue. Also some dyspnea that started when she had PNA. Lab work, Echo, and Myoview studies ordered. Lab studies were normal. Myoview was normal. Echo showed grade 2 diastolic dysfunction, moderate TR. Otherwise normal. Event monitor was ordered. On 07/28/17 she developed Afib. Rate appears to be 100-110.     Past Medical History:  Diagnosis Date  . Acute otitis media   . Costochondritis   . Enthesopathy of hip region   . Fatigue   . Fibromyalgia   . Helicobacter pylori infection   . Insomnia   . MVP (mitral valve prolapse)    OF ANTERIOR MITRAL VALVE LEAFLET  . Obesity   . Palpitations   . Pharyngitis   . Pneumonia   . Sinusitis, chronic     Past Surgical History:  Procedure Laterality Date  . APPENDECTOMY    . CARDIAC CATHETERIZATION  2010   Normal  . STRESS ECHO TEST  05/31/2010   NO ISCHEMIA  . TONSILLECTOMY    . TRANSTHORACIC  ECHOCARDIOGRAM  05/16/2010   EF 55-60%     Current Outpatient Medications  Medication Sig Dispense Refill  . acetaminophen (TYLENOL 8 HOUR ARTHRITIS PAIN) 650 MG CR tablet Take 650 mg by mouth every 8 (eight) hours as needed.    Azucena Freed Serrata (BOSWELLIA PO) Take 1 capsule by mouth at bedtime.    . Calcium 500 MG CHEW Chew 500 mg by mouth every other day.     . Cetirizine HCl (ZYRTEC ALLERGY) 10 MG CAPS Take 10 mg by mouth as needed.     . cholecalciferol (VITAMIN D) 1000 UNITS tablet Take 1,000 Units by mouth daily.      Marland Kitchen ibuprofen (ADVIL,MOTRIN) 200 MG tablet Take 200 mg by mouth every 6 (six) hours as needed.    . loratadine (CLARITIN) 10 MG tablet Take 10 mg by mouth daily as needed for allergies.    . metoprolol succinate (TOPROL-XL) 25 MG 24 hr tablet Take 1 tablet (25 mg total) by mouth daily. 30 tablet 2  . multivitamin (THERAGRAN) per tablet Take 1 tablet by mouth every other day.     Marland Kitchen OVER THE COUNTER MEDICATION Take 1 capsule by mouth at bedtime.    . TURMERIC PO Take 1 capsule by mouth daily.     No current facility-administered medications for this visit.     Allergies:   Codeine  phosphate; Other; and Sulfa antibiotics    Social History:  The patient  reports that  has never smoked. she has never used smokeless tobacco. She reports that she does not drink alcohol or use drugs.   Family History:  The patient's family history includes Breast cancer in her mother and sister.    ROS:  Please see the history of present illness.   Otherwise, review of systems are positive for none.   All other systems are reviewed and negative.    PHYSICAL EXAM: VS:  BP 124/76   Pulse 76   Ht 5\' 5"  (1.651 m)   Wt 177 lb (80.3 kg)   BMI 29.45 kg/m  , BMI Body mass index is 29.45 kg/m. GENERAL:  Well appearing HEENT:  PERRL, EOMI, sclera are clear. Oropharynx is clear. NECK:  No jugular venous distention, carotid upstroke brisk and symmetric, no bruits, no thyromegaly or  adenopathy LUNGS:  Clear to auscultation bilaterally CHEST:  Unremarkable HEART:  RRR,  PMI not displaced or sustained,S1 and S2 within normal limits, no S3, no S4: no clicks, no rubs, no murmurs ABD:  Soft, nontender. BS +, no masses or bruits. No hepatomegaly, no splenomegaly EXT:  2 + pulses throughout, no edema, no cyanosis no clubbing SKIN:  Warm and dry.  No rashes NEURO:  Alert and oriented x 3. Cranial nerves II through XII intact. PSYCH:  Cognitively intact    EKG:  EKG is not ordered today. The ekg ordered  03/29/15 demonstrates NSR with sinus arrhythmia. Otherwise normal.   Recent Labs: 07/11/2017: BUN 12; Creatinine, Ser 0.60; Hemoglobin 14.3; Magnesium 2.5; Platelets 241; Potassium 5.2; Sodium 141; TSH 2.280    Lipid Panel    Component Value Date/Time   CHOL  08/10/2008 0020    183        ATP III CLASSIFICATION:  <200     mg/dL   Desirable  200-239  mg/dL   Borderline High  >=240    mg/dL   High          TRIG 94 08/10/2008 0020   HDL 50 08/10/2008 0020   CHOLHDL 3.7 08/10/2008 0020   VLDL 19 08/10/2008 0020   LDLCALC (H) 08/10/2008 0020    114        Total Cholesterol/HDL:CHD Risk Coronary Heart Disease Risk Table                     Men   Women  1/2 Average Risk   3.4   3.3  Average Risk       5.0   4.4  2 X Average Risk   9.6   7.1  3 X Average Risk  23.4   11.0        Use the calculated Patient Ratio above and the CHD Risk Table to determine the patient's CHD Risk.        ATP III CLASSIFICATION (LDL):  <100     mg/dL   Optimal  100-129  mg/dL   Near or Above                    Optimal  130-159  mg/dL   Borderline  160-189  mg/dL   High  >190     mg/dL   Very High      Wt Readings from Last 3 Encounters:  07/31/17 177 lb (80.3 kg)  07/18/17 179 lb (81.2 kg)  07/11/17 179 lb 12.8 oz (81.6 kg)  Other studies Reviewed: Additional studies/ records that were reviewed today include:  Echo 07/19/16: Study Conclusions  - Left ventricle: The  cavity size was normal. Wall thickness was   normal. Systolic function was normal. The estimated ejection   fraction was in the range of 60% to 65%. Wall motion was normal;   there were no regional wall motion abnormalities. Features are   consistent with a pseudonormal left ventricular filling pattern,   with concomitant abnormal relaxation and increased filling   pressure (grade 2 diastolic dysfunction). - Tricuspid valve: There was moderate regurgitation. - Pulmonary arteries: PA peak pressure: 31 mm Hg (S).  Myoview 07/18/16: Study Highlights   Addendum by Sueanne Margarita, MD on Wed Jul 18, 2017 8:58 PM   The left ventricular ejection fraction is hyperdynamic (>65%).  Nuclear stress EF: 70%.  The study is normal.  This is a low risk study.  Blood pressure demonstrated a normal response to exercise.  There was no ST segment deviation noted during stress.    Finalized by Sueanne Margarita, MD on Wed Jul 18, 2017 3:52 PM   The left ventricular ejection fraction is hyperdynamic (>65%).  Nuclear stress EF: 70%.  The study is normal.  This is a low risk study.     ASSESSMENT AND PLAN:  1.  Atrial fibrillation-paroxysmal. Triggers appear to be caffeine, fatigue, stress. We discussed treatment options including rate control, AAD therapy, ablation. At this point her symptoms are brief. Rate is reasonably controlled on Toprol XL. I would continue rate control for now. She has a [place] Vasc score of 2. I would recommend long term anticoagulation to mitigate risk of stroke. She is reluctant to take anticoagulation but is agreeable to try it. Will start Xarelto 20 mg daily. Avoid use of NSAIDs.  I will follow up in 3 months.    Current medicines are reviewed at length with the patient today.  The patient does not have concerns regarding medicines.  The following changes have been made:  Add Xarelto 20 mg daily  Labs/ tests ordered today include:  No orders of the defined types  were placed in this encounter.    Disposition:   FU 3 months.  Signed, Peter Martinique, MD  07/31/2017 12:18 PM    [facility] [address], [place], [place], [address] Phone [phone], Fax [phone]

## 2017-08-03 ENCOUNTER — Telehealth: Payer: Self-pay | Admitting: Cardiology

## 2017-08-03 DIAGNOSIS — I480 Paroxysmal atrial fibrillation: Secondary | ICD-10-CM

## 2017-08-03 NOTE — Telephone Encounter (Signed)
Returned call to patient.   Patient states she saw Dr. Martinique 1/29 and was told she has Afib and was placed on medication.  Patient states she is the type of person who really likes to get a second opinion.    She would like to see EP doc for another opinion.  Her sister has saw Dr. Caryl Comes in the past and had a good experience, therefore would like to see him if possible for another opinion.    Advised I would route to Dr. Martinique to see if ok to place EP referral to Dr. Caryl Comes.    Patient aware and verbalized understanding.

## 2017-08-03 NOTE — Telephone Encounter (Signed)
Patient made aware and referral placed.

## 2017-08-03 NOTE — Telephone Encounter (Signed)
New message  Pt verbalized that she is calling for the RN  Pt saw Dr.Jordan last tuesday and she said that the monitor   States that she was in AFIB wants to go to [place] for a second opinion

## 2017-08-03 NOTE — Telephone Encounter (Signed)
I am OK with her getting a second opinion with Dr. Klein\  Peter Martinique MD, [facility]

## 2017-08-13 ENCOUNTER — Ambulatory Visit: Payer: Medicare Other | Admitting: Internal Medicine

## 2017-08-13 ENCOUNTER — Encounter: Payer: Self-pay | Admitting: Internal Medicine

## 2017-08-13 VITALS — BP 110/74 | HR 68 | Ht 65.5 in | Wt 176.4 lb

## 2017-08-13 DIAGNOSIS — I480 Paroxysmal atrial fibrillation: Secondary | ICD-10-CM

## 2017-08-13 NOTE — Patient Instructions (Addendum)
Medication Instructions:  Your physician recommends that you continue on your current medications as directed. Please refer to the Current Medication list given to you today.  Labwork: None ordered.  Testing/Procedures:  AliveCor  FDA-cleared EKG at your fingertips. - [employer]   Agricultural engineer, [employer]. [email or web address].[email or web address]   FDA-cleared, clinical grade mobile EKG monitor: Jodelle Red is the most clinically-validated mobile EKG used by the world's leading cardiac care medical professionals.  This may be useful in monitoring palpitations.  We do not have access to have them emailed and reviewed but will be glad to review while in the office.      Follow-Up: Your physician recommends that you schedule a follow-up appointment in: 6-8 weeks with Dr Caryl Comes to discuss Alive Cor results.  Any Other Special Instructions Will Be Listed Below (If Applicable).     If you need a refill on your cardiac medications before your next appointment, please call your pharmacy.

## 2017-08-13 NOTE — Progress Notes (Signed)
ELECTROPHYSIOLOGY CONSULT NOTE  Patient ID: Sheri Sims, MRN: [id number], DOB/AGE: 1950/08/06 67 y.o. Admit date: (Not on file) Date of Consult: 08/13/2017  Primary Physician: Aletha Halim., PA-C Primary Cardiologist: ARUNA NESTLER is a 67 y.o. female who is being seen today for the evaluation of aFIB at the request of PJ.    HPI Sheri Sims is a 67 y.o. female  Seen with a lifelong history of palpitations dating back 30+ years.  These episodes were described as irregular associated with weakness.  Infrequently they were triggered by stress or caffeine.  About 10 years ago she was diagnosed with fibromyalgia and chronic fatigue.  She was treated with metoprolol as her symptoms worsened at that time.  Over the last 2 months she has had increasing problems with palpitations.  They last normally 5-20 minutes.  They are "stronger" harder "occasionally they have been associated with chest discomfort.  They have occurred both at night as well as during the day.  His same time.  Is been associate with significant increase in her baseline fatigue awakening in the morning with poor rest.  In this regard last summer, she had a sleep study which was not significant enough to prompt CPAP.  She also has a history of bruxism and possible TMJ for which she has an oral implant.  This is not helped very much.   Event Recorder personnally reviewed   Atrial fib with CVR of unknown duration   DATE TEST EF   1/19 Echo   60-65 % TR mod//LA (34/1.7/23)         Thromboembolic risk factors ( age -55, Gender-1) for a CHADSVASc Score of 2   Past Medical History:  Diagnosis Date  . Acute otitis media   . Costochondritis   . Enthesopathy of hip region   . Fatigue   . Fibromyalgia   . Helicobacter pylori infection   . Insomnia   . MVP (mitral valve prolapse)    OF ANTERIOR MITRAL VALVE LEAFLET  . Obesity   . Palpitations   . Pharyngitis   . Pneumonia   . Sinusitis,  chronic       Surgical History:  Past Surgical History:  Procedure Laterality Date  . APPENDECTOMY    . CARDIAC CATHETERIZATION  2010   Normal  . STRESS ECHO TEST  05/31/2010   NO ISCHEMIA  . TONSILLECTOMY    . TRANSTHORACIC ECHOCARDIOGRAM  05/16/2010   EF 55-60%     Home Meds: Prior to Admission medications   Medication Sig Start Date End Date Taking? Authorizing Provider  acetaminophen (TYLENOL 8 HOUR ARTHRITIS PAIN) 650 MG CR tablet Take 650 mg by mouth every 8 (eight) hours as needed. 05/04/15   [provider]  Boswellia Serrata (BOSWELLIA PO) Take 1 capsule by mouth at bedtime.    [provider]  Calcium 500 MG CHEW Chew 500 mg by mouth every other day.  05/04/15   [provider]  Cetirizine HCl (ZYRTEC ALLERGY) 10 MG CAPS Take 10 mg by mouth as needed.  05/04/15   [provider]  cholecalciferol (VITAMIN D) 1000 UNITS tablet Take 1,000 Units by mouth daily.      [provider]  loratadine (CLARITIN) 10 MG tablet Take 10 mg by mouth daily as needed for allergies.    [provider]  metoprolol succinate (TOPROL-XL) 25 MG 24 hr tablet Take 1 tablet (25 mg total) by mouth  daily. 05/19/11   Burtis Junes, NP  multivitamin [facility]) per tablet Take 1 tablet by mouth every other day.     [provider]  OVER THE COUNTER MEDICATION Take 1 capsule by mouth at bedtime.    [provider]  rivaroxaban (XARELTO) 20 MG TABS tablet Take 1 tablet (20 mg total) by mouth daily with supper. 07/31/17   Martinique, Peter M, MD  TURMERIC PO Take 1 capsule by mouth daily.    [provider]    Allergies:  Allergies  Allergen Reactions  . Codeine Phosphate Nausea And Vomiting  . Other     Prednisone cream caused blisters   . Sulfa Antibiotics Nausea And Vomiting    Social History   Socioeconomic History  . Marital status: Married    Spouse name: Not on file  . Number of children: 2  . Years of  education: Not on file  . Highest education level: Not on file  Social Needs  . Financial resource strain: Not on file  . Food insecurity - worry: Not on file  . Food insecurity - inability: Not on file  . Transportation needs - medical: Not on file  . Transportation needs - non-medical: Not on file  Occupational History  . Occupation: Retired Tourist information centre manager  Tobacco Use  . Smoking status: Never Smoker  . Smokeless tobacco: Never Used  Substance and Sexual Activity  . Alcohol use: No  . Drug use: No  . Sexual activity: Not on file  Other Topics Concern  . Not on file  Social History Narrative  . Not on file     Family History  Problem Relation Age of Onset  . Breast cancer Mother   . Breast cancer Sister      ROS:  Please see the history of present illness.     All other systems reviewed and negative.    Physical Exam: Blood pressure 110/74, pulse 68, height 5' 5.5" (1.664 m), weight 176 lb 6.4 oz (80 kg). General: Well developed, well nourished female in no acute distress. Head: Normocephalic, atraumatic, sclera non-icteric, no xanthomas, nares are without discharge. EENT: normal  Lymph Nodes:  none Neck: Negative for carotid bruits. JVD not elevated. Back:without scoliosis kyphosis Lungs: Clear bilaterally to auscultation without wheezes, rales, or rhonchi. Breathing is unlabored. Heart: RRR with S1 S2. No murmur . No rubs, or gallops appreciated. Abdomen: Soft, non-tender, non-distended with normoactive bowel sounds. No hepatomegaly. No rebound/guarding. No obvious abdominal masses. Msk:  Strength and tone appear normal for age. Extremities: No clubbing or cyanosis. No edema.  Distal pedal pulses are 2+ and equal bilaterally. Skin: Warm and Dry Neuro: Alert and oriented X 3. CN III-XII intact Grossly normal sensory and motor function . Psych:  Responds to questions appropriately with a normal affect.      Labs: Cardiac Enzymes No results for input(s): CKTOTAL, CKMB,  TROPONINI in the last 72 hours. CBC Lab Results  Component Value Date   WBC 4.4 07/11/2017   HGB 14.3 07/11/2017   HCT 44.8 07/11/2017   MCV 99 (H) 07/11/2017   PLT 241 07/11/2017   PROTIME: No results for input(s): LABPROT, INR in the last 72 hours. Chemistry No results for input(s): NA, K, CL, CO2, BUN, CREATININE, CALCIUM, PROT, BILITOT, ALKPHOS, ALT, AST, GLUCOSE in the last 168 hours.  Invalid input(s): LABALBU Lipids Lab Results  Component Value Date   CHOL  08/10/2008    183        ATP  III CLASSIFICATION:  <200     mg/dL   Desirable  200-239  mg/dL   Borderline High  >=240    mg/dL   High          HDL 50 08/10/2008   LDLCALC (H) 08/10/2008    114        Total Cholesterol/HDL:CHD Risk Coronary Heart Disease Risk Table                     Men   Women  1/2 Average Risk   3.4   3.3  Average Risk       5.0   4.4  2 X Average Risk   9.6   7.1  3 X Average Risk  23.4   11.0        Use the calculated Patient Ratio above and the CHD Risk Table to determine the patient's CHD Risk.        ATP III CLASSIFICATION (LDL):  <100     mg/dL   Optimal  100-129  mg/dL   Near or Above                    Optimal  130-159  mg/dL   Borderline  160-189  mg/dL   High  >190     mg/dL   Very High   TRIG 94 08/10/2008   BNP No results found for: PROBNP Thyroid Function Tests: No results for input(s): TSH, T4TOTAL, T3FREE, THYROIDAB in the last 72 hours.  Invalid input(s): FREET3 Miscellaneous No results found for: DDIMER  Radiology/Studies:  No results found.  EKG: *sinus 68 13/07/38   Assessment and Plan:  Palpitations  Atrial fibrillation-paroxysmal  CHADS-VASc score 2    The patient has long-standing palpitations and recently diagnosed atrial fibrillation.  All of the symptoms are quite disruptive and increasingly frequent.  Treatment options would be clarified by knowing with the mechanism of her palpitations are in addition to the atrial fibrillation  identified.  It is unlikely but possible that she had the atrial fibrillation for 30 years I suspect that there are other symptomatic arrhythmias which might inform a drug option as opposed to an ablation option.   I recommend that she get the AliveCor monitor  Her indication for anticoagulation is borderline.  Recent guidelines would suggest a CHADS-VASc score of 2 and a woman is not clearly associated with the need for anticoagulation.  For her however, stroke risk is the greater dominating factor as opposed to taking the medication.  Thus for now we will continue her on Xarelto.  Her GFR is greater than 50; she asked as to whether her dose was appropriate and I assured her that it was  We will regroup in a few months to review electrograms  Virl Axe

## 2017-08-15 ENCOUNTER — Telehealth: Payer: Self-pay | Admitting: Internal Medicine

## 2017-08-15 NOTE — Telephone Encounter (Signed)
New message  Pt verbalized that she is calling for the RN  Pt want to talk about cell phones and connections for her device   Please call cell and house phone to reach her

## 2017-08-15 NOTE — Telephone Encounter (Signed)
Wore monitor for 12 days. Dr. Caryl Comes wanted her to try to record on her phone via Kardia any irregular heart beat/rhythms.   Her smart phone is not compatible with AliveCor app.  Is there something else that she can do so that Dr. Caryl Comes can eval her symptomatic irregular heart beats?  She is not in the market to [employer] currently.

## 2017-08-21 NOTE — Telephone Encounter (Signed)
Follow up   Patient following up on last call regarding AliveCor app.

## 2017-08-21 NOTE — Telephone Encounter (Signed)
Advised pt we are still waiting to hear from Dr. Caryl Comes on a new recommendation.  Pt appreciative for call.

## 2017-08-22 ENCOUNTER — Telehealth: Payer: Self-pay | Admitting: Internal Medicine

## 2017-08-22 DIAGNOSIS — R918 Other nonspecific abnormal finding of lung field: Secondary | ICD-10-CM

## 2017-08-22 NOTE — Telephone Encounter (Signed)
Routing this to Dr. Melvyn Novas for him to be able to follow up on.  Dr. Melvyn Novas, per pt, do not call before 12pm but she wants to talk with you results from an xray she had done at [facility] and recommendations per them based off of the xray.

## 2017-08-22 NOTE — Telephone Encounter (Signed)
Called pt letting her know recommendations per MW based on the last time she was seen at our visit and that the report and images from [facility] were hard to view.  Pt expressed understanding.  OV scheduled for pt with MW 09/03/17 and also placed order for a chest xray to be done at that visit.  Nothing further needed at this current time.

## 2017-08-22 NOTE — Telephone Encounter (Signed)
Noted. Will call pt after 12pm to get her scheduled for an OV.

## 2017-08-22 NOTE — Telephone Encounter (Signed)
I reviewed their reports and notes and can't actually see the images so strongly rec she make appt to see me in next two weeks with repeat cxr at this office so we can compare apples to apples to previous x-rays we have here and see if anything further needs to be done. As it's been more than 6 months since I've seen her as unfair treatment for me to comment on her care or the recommendations of other doctors without actually seeing her.

## 2017-08-24 NOTE — Telephone Encounter (Signed)
Spoke with pt   Her phone is not compatible with the [place] She will call company Other  review options incl 1) apple watch 2) LINQ   She will call back once she decides

## 2017-08-27 ENCOUNTER — Ambulatory Visit: Payer: Medicare Other | Admitting: Adult Health

## 2017-09-03 ENCOUNTER — Encounter: Payer: Self-pay | Admitting: Internal Medicine

## 2017-09-03 ENCOUNTER — Ambulatory Visit: Payer: Medicare Other | Admitting: Internal Medicine

## 2017-09-03 ENCOUNTER — Ambulatory Visit (INDEPENDENT_AMBULATORY_CARE_PROVIDER_SITE_OTHER)
Admission: RE | Admit: 2017-09-03 | Discharge: 2017-09-03 | Disposition: A | Discharge status: Home / Self Care | Payer: Medicare Other | Source: Ambulatory Visit | Attending: Internal Medicine | Admitting: Internal Medicine

## 2017-09-03 ENCOUNTER — Telehealth: Payer: Self-pay | Admitting: Internal Medicine

## 2017-09-03 VITALS — BP 116/70 | HR 74 | Ht 65.5 in | Wt 179.8 lb

## 2017-09-03 DIAGNOSIS — R918 Other nonspecific abnormal finding of lung field: Secondary | ICD-10-CM | POA: Diagnosis not present

## 2017-09-03 NOTE — Telephone Encounter (Signed)
Spoke with patient who states she got a new phone and is using the cardio mobile service that Dr. Caryl Comes recommended. I advised her how to send the attachment from her app to Dr. Caryl Comes through [employer]. She verbalized understanding and is aware to call back if she has problems sending the attachment. She thanked me for the call.

## 2017-09-03 NOTE — Patient Instructions (Addendum)
Pulmonary follow up is as needed  - try to keep the cxr's inside the [facility] if possible to avoid having to repeat them for comparison purposes

## 2017-09-03 NOTE — Progress Notes (Signed)
Subjective:   Patient ID: Sheri Sims, female    DOB: 02-13-1951   MRN: [id number]    Brief patient profile:  67 yowf never smoker bad pna age 67 but did fine thereafter except for seasonal allergies responding to otcs with abrupt R side pleuritic cp/ chills Nov 03 2013 > [facility] > dx RML pna admit x 2 days > complete resolution of R pain, chills and fever resolved but persistent fatigue and infiltrates RML gradually improving  But still present on  cxr 01/30/14 so referred to pulmonary clinic 02/13/2014 by Dr Kathryne Eriksson    History of Present Illness  02/13/2014 1st [facility] office visit/ Kjell Brannen  Chief Complaint  Patient presents with  . Pulmonary Consult    Referred per Dr. Kathryne Eriksson for eval of abn cxr. Pt states that she was dxed with PNA 4 months ago.  She c/o SOB with walking up and down stairs and if she bends over alot.   overall pattern was very abrupt onset of classic R lat pleuritic cp and much better by the time she was discharged and slow but steady improvement  in all symptoms since then . Never had hemoptysis or purulent sputum - more fatigue limiting activity than sob.  rec No change rx   03/24/2014 f/u ov/Carden Teel re: f/u ? RML mass  Chief Complaint  Patient presents with  . Followup with CXR    Pt states that she is doing well today and denies any new co's.   Not limited by breathing from desired activities   rec - CT chest  03/31/14 Largely wedge shaped peripheral airspace opacity in the right middle lobe most likely post pneumonic atelectasis.   - CT 07/07/2014 >  01/18/2015 no change  - CT 03/15/2016 1. Essentially stable appearance of an irregular shaped mass-like area in the lateral segment of the right middle lobe, which now demonstrates 2 years of stability. This is strongly favored to represent a benign area, likely chronic mucoid impaction within dilated distal bronchi with surrounding scarring and atelectasis.> no f/u needed unless new  symptoms    10/12/2016  f/u ov/Khalee Mazo re: re-establish re RML infiltrates  New location plus prev residual changes  Chief Complaint  Patient presents with  . Acute Visit    last seen 03/2014- pt seen last week by [facility] for PNA. Concern by pt for pleurisy recurrance.   100% prev symptoms resolved , then acutely ill on mid march 2018 sore throat / fever / stuffy nose with yellow discharge / then cough esp in am dark yellow > over about 10 days improved  > minimal residual cough never received abx  Then acutely new location (from prev presumed pna) R pleuritic  cp near previous location then CTa  10/06/16  Hanley Seamen showed same are smaller plus new area on pna > rx IV abx /levaquin x 10 days planned new Lie L side down hurts on R - overall pain only sltly better on abx than at its peak 10/06/16 and no longer hurts to breath deeply  Taking advil 200 up to one daily only / has stronger pain pill but not taking and doesn't know the name rec Finish your antibiotics as planned Take advil up to 3 with meals as needed for pain with breathing or coughing or different positions Please see patient coordinator before you leave today  to schedule CT with contrast in one month and ov same day      11/15/2016  f/u ov/Coralynn Gaona re: RML syndrome/ fibromyalgia  Chief Complaint  Patient presents with  . Follow-up    Discuss CT Chest. She only c/o occ cough. No new co's today.   Not limited by breathing from desired activities/ cough is minimal dry    rec F/u prn     09/03/2017  f/u ov/Grisela Mesch re:  RML syndrome/ fibromyalgia / transient pleuritic cp/ same location as before/ pt on xarelto Chief Complaint  Patient presents with  . Follow-up    had a flare of cough in the Fall of 2018 and then again feb 2019- cxr was done and she is concerned about "spot".  She states that she has been having some pain in her teeth and PND.   Dyspnea:  Limited my fatigue not cp/sob Cough: no Sleep: no resp issues  Did have some  discomfort R lateral and resp advil in fall 2018 and then again similar pain pred/abx   Tooth pain above c/w chronic TMJ already eval and has fibromyalgia as well but the pain in the R lateral c/w is classic pleuritic, localized, same as in past and worse lying down s assoc sob and better since rx as  CAP  No obvious day to day or daytime variability or assoc excess/ purulent sputum or mucus plugs or hemoptysis or cp or chest tightness, subjective wheeze or overt sinus or hb symptoms. No unusual exposure hx or h/o childhood pna/ asthma or knowledge of premature birth.  Sleeping ok flat without nocturnal  or early am exacerbation  of respiratory  c/o's or need for noct saba. Also denies any obvious fluctuation of symptoms with weather or environmental changes or other aggravating or alleviating factors except as outlined above   Current Allergies, Complete Past Medical History, Past Surgical History, Family History, and Social History were reviewed in [employer] record.  ROS  The following are not active complaints unless bolded Hoarseness, sore throat, dysphagia, dental problems, itching, sneezing,  nasal congestion or discharge of excess mucus or purulent secretions, ear ache,   fever, chills, sweats, unintended wt loss or wt gain, classically   exertional cp,  orthopnea pnd or leg swelling, presyncope, palpitations, abdominal pain, anorexia, nausea, vomiting, diarrhea  or change in bowel habits or change in bladder habits, change in stools or change in urine, dysuria, hematuria,  rash, arthralgias, visual complaints, headache, numbness, weakness or ataxia or problems with walking or coordination,  change in mood/affect or memory.        Current Meds  Medication Sig  . acetaminophen (TYLENOL 8 HOUR ARTHRITIS PAIN) 650 MG CR tablet Take 650 mg by mouth every 8 (eight) hours as needed for pain.   Azucena Freed Serrata (BOSWELLIA PO) Take 1 capsule by mouth at bedtime.  .  Calcium 500 MG CHEW Chew 500 mg by mouth every other day.   . Cetirizine HCl (ZYRTEC ALLERGY) 10 MG CAPS Take 10 mg by mouth daily as needed (allergies).   . cholecalciferol (VITAMIN D) 1000 UNITS tablet Take 1,000 Units by mouth daily.    Marland Kitchen loratadine (CLARITIN) 10 MG tablet Take 10 mg by mouth daily as needed for allergies.  . metoprolol succinate (TOPROL-XL) 25 MG 24 hr tablet Take 1 tablet (25 mg total) by mouth daily.  . multivitamin (THERAGRAN) per tablet Take 1 tablet by mouth every other day.   . rivaroxaban (XARELTO) 20 MG TABS tablet Take 1 tablet (20 mg total) by mouth daily with supper.  Objective:   Physical Exam  amb wf nad    Vital signs reviewed - Note on arrival 02 sats  95% on RA    09/03/2017         179  11/15/2016       179  10/12/2016       176  03/24/14           188     02/13/14 187 lb 9.6 oz (85.095 kg)       HEENT: nl dentition, turbinates bilaterally, and oropharynx. Nl external ear canals without cough reflex   NECK :  without JVD/Nodes/TM/ nl carotid upstrokes bilaterally   LUNGS: no acc muscle use,  Nl contour chest which is clear to A and P bilaterally without cough on insp or exp maneuvers   CV:  RRR  no s3 or murmur or increase in P2, and no edema   ABD:  soft and nontender with nl inspiratory excursion in the supine position. No bruits or organomegaly appreciated, bowel sounds nl  MS:  Nl gait/ ext warm without deformities, calf tenderness, cyanosis or clubbing No obvious joint restrictions   SKIN: warm and dry without lesions    NEURO:  alert, approp, nl sensorium with  no motor or cerebellar deficits apparent.        CXR PA and Lateral:   09/03/2017 :    I personally reviewed images and agree with radiology impression as follows:   No active cardiopulmonary disease. No evidence of pneumonia or pulmonary edema         Assessment & Plan:

## 2017-09-03 NOTE — Telephone Encounter (Signed)
New Message   Pt is calling because she says that Sheri Sims told her to download a cardio mobile app to send ekg's and pt is needing to know how and whom to send it. Please call

## 2017-09-04 ENCOUNTER — Encounter: Payer: Self-pay | Admitting: Internal Medicine

## 2017-09-04 NOTE — Assessment & Plan Note (Signed)
-  acute onset May 2015 RML  - ESR 02/13/2014 = 20 - CT chest  03/31/14 Largely wedge shaped peripheral airspace opacity in the right middle lobe most likely post pneumonic atelectasis.   - CT 07/07/2014 >  01/18/2015 no change  - CT 03/15/2016 1. Essentially stable appearance of an irregular shaped mass-like area in the lateral segment of the right middle lobe, which now demonstrates 2 years of stability. This is strongly favored to represent a benign area, likely chronic mucoid impaction within dilated distal bronchi with surrounding scarring and atelectasis.> no f/u needed unless new symptoms - CTa 10/06/16 1.No pulmonary embolism. 2.Focal consolidation in the right upper lobe most likely pneumonia. 3.Slightly decreased size of a masslike opacity in the right middle lobe compared to CT from 2015. This is favored to represent sequela of prior infection.  - HRCT  10/27/2016  Lesion is well demonstrated on coronal and sagittal reconstructions and when comparing to 03/31/2014, the lesion measures 1.2 x 1.8 x 2.4 cm today compared to 1.5 x 2.2 x 2.6 cm on 03/31/2014.  RUL process has completely resolved c/w CAP    Most likely this is a form of RML syndrome with recurrent pna causing the pleurisy which resolves with advil / abx and back to baseline cxr now so no further ct's needed   Discussed in detail all the  indications, usual  risks and alternatives  relative to the benefits with patient who agrees to proceed with conservative f/u as outlined  In light of benign nature of rml syndrome// reviewed.   I had an extended discussion with the patient reviewing all relevant studies completed to date and  lasting 15 to 20 minutes of a 25 minute visit    Each maintenance medication was reviewed in detail including most importantly the difference between maintenance and prns and under what circumstances the prns are to be triggered using an action plan format that is not reflected in the computer  generated alphabetically organized AVS.    Please see AVS for specific instructions unique to this visit that I personally wrote and verbalized to the the pt in detail and then reviewed with pt  by my nurse highlighting any  changes in therapy recommended at today's visit to their plan of care.

## 2017-09-11 ENCOUNTER — Encounter: Payer: Self-pay | Admitting: Internal Medicine

## 2017-09-11 ENCOUNTER — Telehealth: Payer: Self-pay | Admitting: Internal Medicine

## 2017-09-11 NOTE — Telephone Encounter (Signed)
New message    Did you receive the EKG message she sent you through my chart ?

## 2017-09-12 ENCOUNTER — Encounter: Payer: Self-pay | Admitting: Internal Medicine

## 2017-09-12 NOTE — Telephone Encounter (Signed)
Sent message through mychart

## 2017-09-14 ENCOUNTER — Encounter: Payer: Self-pay | Admitting: Internal Medicine

## 2017-09-17 ENCOUNTER — Encounter: Payer: Self-pay | Admitting: Internal Medicine

## 2017-09-26 ENCOUNTER — Ambulatory Visit: Payer: Medicare Other | Admitting: Internal Medicine

## 2017-10-11 ENCOUNTER — Encounter: Payer: Self-pay | Admitting: Cardiology

## 2017-10-17 ENCOUNTER — Ambulatory Visit: Payer: Medicare Other | Admitting: Cardiology

## 2017-10-17 ENCOUNTER — Telehealth: Payer: Self-pay | Admitting: Internal Medicine

## 2017-10-17 NOTE — Telephone Encounter (Signed)
Spoke with patient in regards to her Afib HR at 149.Marland Kitchen She did not take her BP at the time, but felt SOB, a heaviness in her chest and her heart pounding fast.   This all occurred  4/16 evening.  She has an appt Friday with Dr Caryl Comes.  She will monitor until then and inform us if this occurs.  I told her to take another dose of Metoprolol if this occurs again, if her BP is stable.  Please advise, thank you

## 2017-10-17 NOTE — Telephone Encounter (Signed)
Patient c/o Palpitations:  High priority if patient c/o lightheadedness, shortness of breath, or chest pain  1) How long have you had palpitations/irregular HR/ Afib? Are you having the symptoms now? No   2) Are you currently experiencing lightheadedness, SOB or CP? SOB, lightheadedness   3) Do you have a history of afib (atrial fibrillation) or irregular heart rhythm? yes  Have you checked your BP or HR? (document readings if available): HR between 140-152 4) Are you experiencing any other symptoms? Headaches   Patient has an appt Friday but what like to discuss what to do in the meantime if this happens again.

## 2017-10-18 NOTE — Telephone Encounter (Signed)
Reasonable to take extra metoprolol as suggested and will see her on friday

## 2017-10-19 ENCOUNTER — Ambulatory Visit: Payer: Medicare Other | Admitting: Internal Medicine

## 2017-10-19 VITALS — BP 118/82 | HR 76 | Ht 65.5 in | Wt 181.0 lb

## 2017-10-19 DIAGNOSIS — R002 Palpitations: Secondary | ICD-10-CM

## 2017-10-19 DIAGNOSIS — I480 Paroxysmal atrial fibrillation: Secondary | ICD-10-CM

## 2017-10-19 MED ORDER — PROPRANOLOL HCL 10 MG PO TABS: 10.0000 mg | ORAL_TABLET | ORAL | 6 refills | Status: DC | PRN

## 2017-10-19 NOTE — Progress Notes (Signed)
Patient Care Team: Aletha Halim., PA-C as PCP - General (Family Medicine)   HPI  Sheri Sims is a 67 y.o. female Seen in followup for palpitations and atrial fibrillation.    She brings him to her [employer] monitor.  She has had one episode of atrial fibrillation that lasted about 45 minutes was associated with lightheadedness and residual fatigue.  She also has other symptomatic events which on monitoring ( and ECG today) shows quite variable PP intervals.  T   Otherwise quite well without sob, chest pain or edema  No bleeding on her Xarelto  Date Cr Hgb  1/19 0.6 14.3          Records and Results Reviewed telemetry strips  Past Medical History:  Diagnosis Date  . Acute otitis media   . Costochondritis   . Enthesopathy of hip region   . Fatigue   . Fibromyalgia   . Helicobacter pylori infection   . Insomnia   . MVP (mitral valve prolapse)    OF ANTERIOR MITRAL VALVE LEAFLET  . Obesity   . Palpitations   . Pharyngitis   . Pneumonia   . Sinusitis, chronic     Past Surgical History:  Procedure Laterality Date  . APPENDECTOMY    . CARDIAC CATHETERIZATION  2010   Normal  . STRESS ECHO TEST  05/31/2010   NO ISCHEMIA  . TONSILLECTOMY    . TRANSTHORACIC ECHOCARDIOGRAM  05/16/2010   EF 55-60%    Current Meds  Medication Sig  . acetaminophen (TYLENOL 8 HOUR ARTHRITIS PAIN) 650 MG CR tablet Take 650 mg by mouth every 8 (eight) hours as needed for pain.   . Calcium 500 MG CHEW Chew 500 mg by mouth every other day.   . Cetirizine HCl (ZYRTEC ALLERGY) 10 MG CAPS Take 10 mg by mouth daily as needed (allergies).   . cholecalciferol (VITAMIN D) 1000 UNITS tablet Take 1,000 Units by mouth daily.    Marland Kitchen loratadine (CLARITIN) 10 MG tablet Take 10 mg by mouth daily as needed for allergies.  . metoprolol succinate (TOPROL-XL) 25 MG 24 hr tablet Take 1 tablet (25 mg total) by mouth daily.  . multivitamin (THERAGRAN) per tablet Take 1 tablet by mouth every  other day.   . rivaroxaban (XARELTO) 20 MG TABS tablet Take 1 tablet (20 mg total) by mouth daily with supper.  . [DISCONTINUED] Boswellia Serrata (BOSWELLIA PO) Take 1 capsule by mouth at bedtime.    Allergies  Allergen Reactions  . Codeine Phosphate Nausea And Vomiting  . Other     Prednisone cream caused blisters   . Sulfa Antibiotics Nausea And Vomiting      Review of Systems negative except from HPI and PMH  Physical Exam BP 118/82   Pulse 76   Ht 5' 5.5" (1.664 m)   Wt 181 lb (82.1 kg)   BMI 29.66 kg/m  Well developed and well nourished in no acute distress HENT normal E scleral and icterus clear Neck Supple JVP flat; carotids brisk and full Clear to ausculation Regular rate and rhythm, no murmurs gallops or rub Soft with active bowl sounds No clubbing cyanosis  Edema Alert and oriented, grossly normal motor and sensory function Skin Warm and Dry  ECG demonstrates sinus rhythm at 76 with some variability in RR intervals Normal 13/07/36  [employer] telemetry reviewed.  4/16 atrial fibrillation with a rapid rate other symptomatic palpitations were associated with sinus arrhythmia/PACs  Assessment and  Plan  Atrial fibrillation-paroxysmal  Palpitations associated with PACs and variable PP intervals    It is unlikely that she has significant sinus arrhythmia given her age.  I suspect that this more likely represents atrial ectopic activity.  For now we will continue to treat it with her metoprolol.  For atrial fibrillation episodes have been infrequent.  She is tolerating her anticoagulation without bleeding.  For now, we will treat the atrial fibrillation for symptoms.  I have given her prescription for propranolol 10 mg to take x2 every hour for recurrent tachypalpitations.  If the frequency increases, we could consider the use of a 1C antiarrhythmic either on an as-needed basis or on a daily basis.  The specificity of the finding of the PP variability is hard  to know as her ECG today is similarly abnormal and she is without significant symptoms.  We discussed triggers for coming to the emergency room.  I suggested that, knowing that the last episode terminated on its own, it is likely that the next episode will terminate also.  It might be reasonable to wait as long as 2-3 hours to see if it terminates on her own prior to coming to the emergency room.  In the event that she shows up in the emergency room she could be cardioverted.  We spent more than 50% of our >25 min visit in face to face counseling regarding the above        Current medicines are reviewed at length with the patient today .  The patient does not have concerns regarding medicines.

## 2017-10-19 NOTE — Patient Instructions (Addendum)
  Medication Instructions:  Your physician has recommended you make the following change in your medication:   1. Begin Propanolol 10mg  tablet, every hour x2, for heart palpitations   Labwork: None ordered.  Testing/Procedures: None ordered.  Follow-Up: Your physician wants you to follow-up in: 6 months with Dr Gari Crown will receive a reminder letter in the mail two months in advance. If you don't receive a letter, please call our office to schedule the follow-up appointment.  Any Other Special Instructions Will Be Listed Below (If Applicable).     If you need a refill on your cardiac medications before your next appointment, please call your pharmacy.

## 2017-10-24 ENCOUNTER — Other Ambulatory Visit: Payer: Self-pay | Admitting: Family Medicine

## 2017-10-24 DIAGNOSIS — Z1231 Encounter for screening mammogram for malignant neoplasm of breast: Secondary | ICD-10-CM

## 2017-11-05 DIAGNOSIS — R062 Wheezing: Secondary | ICD-10-CM | POA: Insufficient documentation

## 2017-11-14 ENCOUNTER — Ambulatory Visit: Payer: Medicare Other

## 2017-12-04 ENCOUNTER — Ambulatory Visit
Admission: RE | Admit: 2017-12-04 | Discharge: 2017-12-04 | Disposition: A | Discharge status: Home / Self Care | Payer: Medicare Other | Source: Ambulatory Visit | Attending: Family Medicine | Admitting: Family Medicine

## 2017-12-04 DIAGNOSIS — Z1231 Encounter for screening mammogram for malignant neoplasm of breast: Secondary | ICD-10-CM

## 2018-01-09 DIAGNOSIS — M25561 Pain in right knee: Secondary | ICD-10-CM | POA: Insufficient documentation

## 2018-04-30 ENCOUNTER — Telehealth: Payer: Self-pay | Admitting: Internal Medicine

## 2018-04-30 NOTE — Telephone Encounter (Signed)
LMTCB  Per Dr. Caryl Comes... He doesn't review EKG's sent through My Chart from [facility] but requests that she bring them with her to her OV with Chanetta Marshall NP tomorrow.

## 2018-04-30 NOTE — Telephone Encounter (Signed)
Pt advised and will bring her strips with her to her appt tomorrow with Chanetta Marshall NP.Marland Kitchen

## 2018-04-30 NOTE — Telephone Encounter (Signed)
New Message   Patient c/o Palpitations:  High priority if patient c/o lightheadedness, shortness of breath, or chest pain  1) How long have you had palpitations/irregular HR/ Afib? Are you having the symptoms now? Every day for the past 7 days  2) Are you currently experiencing lightheadedness, SOB or CP? Some shortness of breath  3) Do you have a history of afib (atrial fibrillation) or irregular heart rhythm? Yes   4) Have you checked your BP or HR? (document readings if available): she has checked her HR was not available at the time of call but she did indicate she sent those readings to Dr. Caryl Comes  5) Are you experiencing any other symptoms? Some weakness, sometimes feeling heavy in the chest.   I scheduled patient to come in on 10/30 and see [facility]. She states that palpitations and shortness of breath has been occurring daily for the past 7 days. Its not all day constant just sporadic but daily.

## 2018-05-01 ENCOUNTER — Encounter: Payer: Self-pay | Admitting: Nurse Practitioner

## 2018-05-01 ENCOUNTER — Ambulatory Visit (INDEPENDENT_AMBULATORY_CARE_PROVIDER_SITE_OTHER): Payer: Medicare Other | Admitting: Internal Medicine

## 2018-05-01 ENCOUNTER — Telehealth: Payer: Self-pay | Admitting: Internal Medicine

## 2018-05-01 ENCOUNTER — Ambulatory Visit: Payer: Medicare Other | Admitting: Nurse Practitioner

## 2018-05-01 ENCOUNTER — Encounter: Payer: Self-pay | Admitting: Internal Medicine

## 2018-05-01 ENCOUNTER — Ambulatory Visit (INDEPENDENT_AMBULATORY_CARE_PROVIDER_SITE_OTHER)
Admission: RE | Admit: 2018-05-01 | Discharge: 2018-05-01 | Disposition: A | Discharge status: Home / Self Care | Payer: Medicare Other | Source: Ambulatory Visit | Attending: Internal Medicine | Admitting: Internal Medicine

## 2018-05-01 VITALS — BP 122/80 | HR 66 | Ht 65.5 in | Wt 183.0 lb

## 2018-05-01 VITALS — BP 132/66 | HR 83 | Ht 65.5 in | Wt 183.0 lb

## 2018-05-01 DIAGNOSIS — I480 Paroxysmal atrial fibrillation: Secondary | ICD-10-CM | POA: Diagnosis not present

## 2018-05-01 DIAGNOSIS — R918 Other nonspecific abnormal finding of lung field: Secondary | ICD-10-CM

## 2018-05-01 MED ORDER — HYDROCODONE-HOMATROPINE 5-1.5 MG/5ML PO SYRP: 5.0000 mL | ORAL_SOLUTION | ORAL | 0 refills | Status: DC | PRN

## 2018-05-01 MED ORDER — AMOXICILLIN-POT CLAVULANATE 875-125 MG PO TABS: 1.0000 | ORAL_TABLET | Freq: Two times a day (BID) | ORAL | 0 refills | Status: AC

## 2018-05-01 MED ORDER — FLECAINIDE ACETATE 50 MG PO TABS: 50.0000 mg | ORAL_TABLET | Freq: Two times a day (BID) | ORAL | 3 refills | Status: DC

## 2018-05-01 NOTE — Telephone Encounter (Signed)
Done cxr ordered

## 2018-05-01 NOTE — Progress Notes (Signed)
Electrophysiology Office Note Date: 05/01/2018  ID:  Sheri, Sims 10/08/1950, MRN [id number]  PCP: Aletha Halim., PA-C Electrophysiologist: Caryl Comes  CC: AF follow up  Sheri Sims is a 67 y.o. female seen today for Dr Caryl Comes.  She presents today for routine electrophysiology followup.  Since last being seen in our clinic, the patient has had increased palpitations and feelings of lightheadedness.  She has an Psychologist, forensic and strips reviewed today.  Her symptoms of palpitations are AF on her AliveCor strips. The episodes that she has where she feels lightheaded and "washed out" are sinus rhythm with blocked PAC's resulting in relative bradycardia. She has been compliant with low dose Metoprolol. She has also had right sided pain that in the past has been associated with pleurisy but not at that same location.   She denies PND, orthopnea, nausea, vomiting, syncope, edema, weight gain, or early satiety.  Past Medical History:  Diagnosis Date  . Acute otitis media   . Costochondritis   . Enthesopathy of hip region   . Fatigue   . Fibromyalgia   . Helicobacter pylori infection   . Insomnia   . MVP (mitral valve prolapse)    OF ANTERIOR MITRAL VALVE LEAFLET  . Obesity   . Palpitations   . Pharyngitis   . Pneumonia   . Sinusitis, chronic    Past Surgical History:  Procedure Laterality Date  . APPENDECTOMY    . CARDIAC CATHETERIZATION  2010   Normal  . STRESS ECHO TEST  05/31/2010   NO ISCHEMIA  . TONSILLECTOMY    . TRANSTHORACIC ECHOCARDIOGRAM  05/16/2010   EF 55-60%    Current Outpatient Medications  Medication Sig Dispense Refill  . acetaminophen (TYLENOL 8 HOUR ARTHRITIS PAIN) 650 MG CR tablet Take 650 mg by mouth every 8 (eight) hours as needed for pain.     . Calcium 500 MG CHEW Chew 500 mg by mouth every other day.     . Cetirizine HCl (ZYRTEC ALLERGY) 10 MG CAPS Take 10 mg by mouth daily as needed (allergies).     . cholecalciferol (VITAMIN D) 1000  UNITS tablet Take 1,000 Units by mouth daily.      Marland Kitchen loratadine (CLARITIN) 10 MG tablet Take 10 mg by mouth daily as needed for allergies.    . metoprolol succinate (TOPROL-XL) 25 MG 24 hr tablet Take 1 tablet (25 mg total) by mouth daily. 30 tablet 2  . multivitamin (THERAGRAN) per tablet Take 1 tablet by mouth every other day.     . propranolol (INDERAL) 10 MG tablet Take 1 tablet (10 mg total) by mouth as needed. Take 1 tablet (10mg ) by mouth every hour x2 for heart palpitations. 30 tablet 6  . rivaroxaban (XARELTO) 20 MG TABS tablet Take 1 tablet (20 mg total) by mouth daily with supper. 30 tablet 11  . flecainide (TAMBOCOR) 50 MG tablet Take 1 tablet (50 mg total) by mouth 2 (two) times daily. 180 tablet 3   No current facility-administered medications for this visit.     Allergies:   Codeine; Sulfur dioxide; Sulfites; Codeine phosphate; Other; and Sulfa antibiotics   Social History: Social History   Socioeconomic History  . Marital status: Married    Spouse name: Not on file  . Number of children: 2  . Years of education: Not on file  . Highest education level: Not on file  Occupational History  . Occupation: Retired Ecologist  . Financial  resource strain: Not on file  . Food insecurity:    Worry: Not on file    Inability: Not on file  . Transportation needs:    Medical: Not on file    Non-medical: Not on file  Tobacco Use  . Smoking status: Never Smoker  . Smokeless tobacco: Never Used  Substance and Sexual Activity  . Alcohol use: No  . Drug use: No  . Sexual activity: Not on file  Lifestyle  . Physical activity:    Days per week: Not on file    Minutes per session: Not on file  . Stress: Not on file  Relationships  . Social connections:    Talks on phone: Not on file    Gets together: Not on file    Attends religious service: Not on file    Active member of club or organization: Not on file    Attends meetings of clubs or organizations: Not on  file    Relationship status: Not on file  . Intimate partner violence:    Fear of current or ex partner: Not on file    Emotionally abused: Not on file    Physically abused: Not on file    Forced sexual activity: Not on file  Other Topics Concern  . Not on file  Social History Narrative  . Not on file    Family History: Family History  Problem Relation Age of Onset  . Breast cancer Mother   . Breast cancer Sister     Review of Systems: All other systems reviewed and are otherwise negative except as noted above.   Physical Exam: VS:  BP 132/66   Pulse 83   Ht 5' 5.5" (1.664 m)   Wt 183 lb (83 kg)   SpO2 97%   BMI 29.99 kg/m  , BMI Body mass index is 29.99 kg/m. Wt Readings from Last 3 Encounters:  05/01/18 183 lb (83 kg)  10/19/17 181 lb (82.1 kg)  09/03/17 179 lb 12.8 oz (81.6 kg)    GEN- The patient is elderly appearing, alert and oriented x 3 today.   HEENT: normocephalic, atraumatic; sclera clear, conjunctiva pink; hearing intact; oropharynx clear; neck supple  Lungs- Clear to ausculation bilaterally, normal work of breathing.  No wheezes, rales, rhonchi Heart- Regular rate and rhythm  GI- soft, non-tender, non-distended, bowel sounds present  Extremities- no clubbing, cyanosis, or edema  MS- no significant deformity or atrophy Skin- warm and dry, no rash or lesion  Psych- euthymic mood, full affect Neuro- strength and sensation are intact   EKG:  EKG is ordered today. EKG today shows sinus rhythm, normal intervals  Recent Labs: 07/11/2017: BUN 12; Creatinine, Ser 0.60; Hemoglobin 14.3; Magnesium 2.5; Platelets 241; Potassium 5.2; Sodium 141; TSH 2.280    Other studies Reviewed: Additional studies/ records that were reviewed today include:Dr Klein's office notes  Assessment and Plan:  1.  Paroxysmal atrial fibrillation AliveCor strips reviewed today and demonstrate atrial fibrillation with RVR as well as SR with blocked PAC's resulting in relative  bradycardia. Both are symptomatic.  Continue Xarelto for CHADS2VASC of 2 Strips reviewed with Dr Caryl Comes today and he recommends starting Flecainide 50mg  twice daily EKG next week Follow up with Dr Caryl Comes in 4 weeks   Current medicines are reviewed at length with the patient today.   The patient does not have concerns regarding her medicines.  The following changes were made today:  Start Flecainide 50mg  twice daily  Labs/ tests ordered today include:  Orders Placed This Encounter  Procedures  . EKG 12-Lead     Disposition:   Follow up with Dr Caryl Comes in 4 weeks   Signed, Chanetta Marshall, NP 05/01/2018 10:36 AM   [facility] [address] [place] [address] [phone] (office) [phone] (fax)

## 2018-05-01 NOTE — Patient Instructions (Addendum)
Augmentin 875 mg take one pill twice daily  X 10 days - take at breakfast and supper with large glass of water.  It would help reduce the usual side effects (diarrhea and yeast infections) if you ate cultured yogurt at lunch.   For cough > hydromet 1 tsp every 4 hours as needed    Call if not 100% better in 2 weeks

## 2018-05-01 NOTE — Telephone Encounter (Signed)
That's fine - use dx pulmonary infiltrates

## 2018-05-01 NOTE — Telephone Encounter (Signed)
Spoke with pt  She states woke up with right side pain "higher than usual pleurisy" Wants to come get CXR  I advised will need ov  I scheduled her for 3:30 today Can I go ahead and order cxr? Dx? Please advise thanks

## 2018-05-01 NOTE — Progress Notes (Signed)
Subjective:   Patient ID: Sheri Sims, female    DOB: May 06, 1951   MRN: [id number]    Brief patient profile:  74 yowf never smoker bad pna age 67 but did fine thereafter except for seasonal allergies responding to otcs with abrupt R side pleuritic cp/ chills Nov 03 2013 > [facility]  [facility] > dx RML pna admit x 2 days > complete resolution of R pain, chills and fever resolved but persistent fatigue and infiltrates RML gradually improving  But still present on  cxr 01/30/14 so referred to pulmonary clinic 02/13/2014 by Dr Kathryne Eriksson    History of Present Illness  02/13/2014 1st [facility] office visit/ Sheri Sims  Chief Complaint  Patient presents with  . Pulmonary Consult    Referred per Dr. Kathryne Eriksson for eval of abn cxr. Pt states that she was dxed with PNA 4 months ago.  She c/o SOB with walking up and down stairs and if she bends over alot.   overall pattern was very abrupt onset of classic R lat pleuritic cp and much better by the time she was discharged and slow but steady improvement  in all symptoms since then . Never had hemoptysis or purulent sputum - more fatigue limiting activity than sob.  rec No change rx   03/24/2014 f/u ov/Sheri Sims re: f/u ? RML mass  Chief Complaint  Patient presents with  . Followup with CXR    Pt states that she is doing well today and denies any new co's.   Not limited by breathing from desired activities   rec - CT chest  03/31/14 Largely wedge shaped peripheral airspace opacity in the right middle lobe most likely post pneumonic atelectasis.   - CT 07/07/2014 >  01/18/2015 no change  - CT 03/15/2016 1. Essentially stable appearance of an irregular shaped mass-like area in the lateral segment of the right middle lobe, which now demonstrates 2 years of stability. This is strongly favored to represent a benign area, likely chronic mucoid impaction within dilated distal bronchi with surrounding scarring and atelectasis.> no f/u needed unless new  symptoms    10/12/2016  f/u ov/Sheri Sims re: re-establish re RML infiltrates  New location plus prev residual changes  Chief Complaint  Patient presents with  . Acute Visit    last seen 03/2014- pt seen last week by [facility] for PNA. Concern by pt for pleurisy recurrance.   100% prev symptoms resolved , then acutely ill on mid march 2018 sore throat / fever / stuffy nose with yellow discharge / then cough esp in am dark yellow > over about 10 days improved  > minimal residual cough never received abx  Then acutely new location (from prev presumed pna) R pleuritic  cp near previous location then CTa  10/06/16  Sheri Sims showed same are smaller plus new area on pna > rx IV abx /levaquin x 10 days planned new Lie L side down hurts on R - overall pain only sltly better on abx than at its peak 10/06/16 and no longer hurts to breath deeply  Taking advil 200 up to one daily only / has stronger pain pill but not taking and doesn't know the name rec Finish your antibiotics as planned Take advil up to 3 with meals as needed for pain with breathing or coughing or different positions Please see patient coordinator before you leave today  to schedule CT with contrast in one month and ov same day      11/15/2016  f/u ov/Sheri Sims re: RML syndrome/ fibromyalgia  Chief Complaint  Patient presents with  . Follow-up    Discuss CT Chest. She only c/o occ cough. No new co's today.   Not limited by breathing from desired activities/ cough is minimal dry    rec F/u prn     09/03/2017  f/u ov/Sheri Sims re:  RML syndrome/ fibromyalgia / transient pleuritic cp/ same location as before/ pt on xarelto Chief Complaint  Patient presents with  . Follow-up    had a flare of cough in the Fall of 2018 and then again feb 2019- cxr was done and she is concerned about "spot".  She states that she has been having some pain in her teeth and PND.   Dyspnea:  Limited my fatigue not cp/sob Cough: no Sleep: no resp issues  Did have some  discomfort R lateral and respond  advil in fall 2018 and then again similar pain pred/abx  Tooth pain above c/w chronic TMJ already eval and has fibromyalgia as well but the pain in the R lateral c/w is classic pleuritic, localized, same as in past and worse lying down s assoc sob and better since rx as  CAP rec No change rx   05/01/2018 acute extended ov/Sheri Sims re: ? Flare of rml syndrome vs fibromyalgia  Chief Complaint  Patient presents with  . Acute Visit    woke up right side back pain "a little higher than the pleurisy spot".  She states it hurts to take a deep breath. She has had some cough "with a raspy sound"- prod with very little sputum and she is unsure of color.    chronic/  recurrent pain R cw pain sev times x a week always goes away after   p tylenol while maint on xarelto for afib.   Then new slt more superior R cw  pain am of ov assoc with increased  cough since 04/27/18 min prod s fever or sob .   No obvious patterns in day to day or daytime variability or assoc excess/ purulent sputum or mucus plugs or hemoptysis or cp or chest tightness, subjective wheeze or overt sinus or hb symptoms.   Sleeping most nights  without nocturnal  or early am exacerbation  of respiratory  c/o's or need for noct saba. Also denies any obvious fluctuation of symptoms with weather or environmental changes or other aggravating or alleviating factors except as outlined above   No unusual exposure hx or h/o childhood pna/ asthma or knowledge of premature birth.  Current Allergies, Complete Past Medical History, Past Surgical History, Family History, and Social History were reviewed in [employer] record.  ROS  The following are not active complaints unless bolded Hoarseness, sore throat, dysphagia, dental problems, itching, sneezing,  nasal congestion or discharge of excess mucus or purulent secretions, ear ache,   fever, chills, sweats, unintended wt loss or wt gain, classically  pleuritic or exertional cp,  orthopnea pnd or arm/hand swelling  or leg swelling, presyncope, palpitations, abdominal pain, anorexia, nausea, vomiting, diarrhea  or change in bowel habits or change in bladder habits, change in stools or change in urine, dysuria, hematuria,  rash, arthralgias, visual complaints, headache, numbness, weakness or ataxia or problems with walking or coordination,  change in mood or  memory.        Current Meds  Medication Sig  . acetaminophen (TYLENOL 8 HOUR ARTHRITIS PAIN) 650 MG CR tablet Take 650 mg by mouth every 8 (eight) hours as  needed for pain.   . Calcium 500 MG CHEW Chew 500 mg by mouth every other day.   . Cetirizine HCl (ZYRTEC ALLERGY) 10 MG CAPS Take 10 mg by mouth daily as needed (allergies).   . cholecalciferol (VITAMIN D) 1000 UNITS tablet Take 1,000 Units by mouth daily.    Marland Kitchen loratadine (CLARITIN) 10 MG tablet Take 10 mg by mouth daily as needed for allergies.  . metoprolol succinate (TOPROL-XL) 25 MG 24 hr tablet Take 1 tablet (25 mg total) by mouth daily.  . multivitamin (THERAGRAN) per tablet Take 1 tablet by mouth every other day.   . propranolol (INDERAL) 10 MG tablet Take 1 tablet (10 mg total) by mouth as needed. Take 1 tablet (10mg ) by mouth every hour x2 for heart palpitations.  . rivaroxaban (XARELTO) 20 MG TABS tablet Take 1 tablet (20 mg total) by mouth daily with supper.              Objective:   Physical Exam    amb wf nad / quite talkative  Vital signs reviewed - Note on arrival 02 sats  96% on RA    05/01/2018     183  09/03/2017         179  11/15/2016       179  10/12/2016       176  03/24/14           188     02/13/14 187 lb 9.6 oz (85.095 kg)      HEENT: nl dentition, turbinates bilaterally, and oropharynx. Nl external ear canals without cough reflex   NECK :  without JVD/Nodes/TM/ nl carotid upstrokes bilaterally   LUNGS: no acc muscle use,  Nl contour chest which is clear to A and P bilaterally without cough on  insp or exp maneuvers - no rub appreciated   CV:  RRR  no s3 or murmur or increase in P2, and no edema   ABD:  soft and nontender with nl inspiratory excursion in the supine position. No bruits or organomegaly appreciated, bowel sounds nl  MS:  Nl gait/ ext warm without deformities, calf tenderness, cyanosis or clubbing No obvious joint restrictions   SKIN: warm and dry without lesions    NEURO:  alert, approp, nl sensorium with  no motor or cerebellar deficits apparent.       CXR PA and Lateral:   05/01/2018 :    I personally reviewed images -  impression as follows: No acute changes          Assessment & Plan:

## 2018-05-01 NOTE — Patient Instructions (Signed)
Medication Instructions:  FLECAINIDE 50mg  twice a day If you need a refill on your cardiac medications before your next appointment, please call your pharmacy.   Lab work:  If you have labs (blood work) drawn today and your tests are completely normal, you will receive your results only by: Marland Kitchen MyChart Message (if you have MyChart) OR . A paper copy in the mail If you have any lab test that is abnormal or we need to change your treatment, we will call you to review the results.  Testing/Procedures: EKG NEXT WEEK (NURSE VISIT)  Follow-Up: At [facility], you and your health needs are our priority.  As part of our continuing mission to provide you with exceptional heart care, we have created designated Provider Care Teams.  These Care Teams include your primary Cardiologist (physician) and Advanced Practice Providers (APPs -  Physician Assistants and Nurse Practitioners) who all work together to provide you with the care you need, when you need it. You will need a follow up appointment in 4 weeks.  Please call our office 2 months in advance to schedule this appointment.  You may see Dr Caryl Comes or one of the following Advanced Practice Providers on your designated Care Team:    Any Other Special Instructions Will Be Listed Below (If Applicable).

## 2018-05-02 ENCOUNTER — Encounter: Payer: Self-pay | Admitting: Internal Medicine

## 2018-05-02 NOTE — Assessment & Plan Note (Addendum)
-  acute onset May 2015 RML  - ESR 02/13/2014 = 20 - CT chest  03/31/14 Largely wedge shaped peripheral airspace opacity in the right middle lobe most likely post pneumonic atelectasis.   - CT 07/07/2014 >  01/18/2015 no change  - CT 03/15/2016 1. Essentially stable appearance of an irregular shaped mass-like area in the lateral segment of the right middle lobe, which now demonstrates 2 years of stability. This is strongly favored to represent a benign area, likely chronic mucoid impaction within dilated distal bronchi with surrounding scarring and atelectasis.> no f/u needed unless new symptoms - CTa 10/06/16 1.No pulmonary embolism. 2.Focal consolidation in the right upper lobe most likely pneumonia. 3.Slightly decreased size of a masslike opacity in the right middle lobe compared to CT from 2015. This is favored to represent sequela of prior infection.  - HRCT  10/27/2016  Lesion is well demonstrated on coronal and sagittal reconstructions and when comparing to 03/31/2014, the lesion measures 1.2 x 1.8 x 2.4 cm today compared to 1.5 x 2.2 x 2.6 cm on 03/31/2014.  RUL process has completely resolved c/w CAP   - recurrent R chest wall pain 05/01/2018 > rx empirically with 10  Days augmentin   Since the pain as described is adjacent to  the previous location of chronic/recurrent cp and occurred in the setting of a new cough I assume she's having a flare of rml syndrome even in the absence of "macroscopic" changes on cxr and best to rx with augmentin here.   F/u ct can be considered if not responding by  2 weeks   ddx includes mscp from cough and fibromyalgia flare so rx with hydromet should help   Discussed in detail all the  indications, usual  risks and alternatives  relative to the benefits with patient who agrees to proceed with rx as outlined.      I had an extended discussion with the patient reviewing all relevant studies completed to date and  lasting 15 to 20 minutes of a 25  minute acute office visit    Each maintenance medication was reviewed in detail including most importantly the difference between maintenance and prns and under what circumstances the prns are to be triggered using an action plan format that is not reflected in the computer generated alphabetically organized AVS.     Please see AVS for specific instructions unique to this visit that I personally wrote and verbalized to the the pt in detail and then reviewed with pt  by my nurse highlighting any  changes in therapy recommended at today's visit to their plan of care.

## 2018-05-09 ENCOUNTER — Ambulatory Visit (INDEPENDENT_AMBULATORY_CARE_PROVIDER_SITE_OTHER): Payer: Medicare Other

## 2018-05-09 VITALS — HR 73 | Ht 65.0 in | Wt 181.4 lb

## 2018-05-09 DIAGNOSIS — Z79899 Other long term (current) drug therapy: Secondary | ICD-10-CM

## 2018-05-09 DIAGNOSIS — I480 Paroxysmal atrial fibrillation: Secondary | ICD-10-CM

## 2018-05-09 NOTE — Progress Notes (Signed)
1.) Reason for visit: Medication management (Flecainide 50 mg bid)  2.) Name of MD requesting visit: Chanetta Marshall NP  3.) H&P: Paroxysmal Atrial Fibrillation  4.) ROS related to problem: Patient started on Flecainide 10/30.  The patient states that since the medication start, she has not had any arrhythmias in her chest.  She does feel dizzy at times, but that may be in conjunction with Amoxicillin.  We will continue to monitor.  5.) Assessment and plan per MD: No changes/recommendations per Dr Radford Pax (DOD)

## 2018-05-26 ENCOUNTER — Telehealth: Payer: Self-pay | Admitting: Internal Medicine

## 2018-05-26 NOTE — Telephone Encounter (Signed)
Cardiology Moonlighter Note  Returned page from patient. Had an episode of AF earlier today. Felt symptomatic with palpitations.  Her symptoms persisted and she took 10 mg of propranolol as instructed by her cardiologist.  This resulted in improvement in her symptoms.  She notes that she was taking her p.m. medicines later in the evening and accidentally took 1 more dose of 10 mg of propranolol in addition to her metoprolol 25 mg tablet.  The patient is currently asymptomatic, however she now is concerned that she has taken too much beta-blocker.  I reassured the patient that despite the fact that she has taken more than prescribed, she is at a very low risk for developing any adverse effects from her medications given that her overall beta-blocker dose is very small.  She states that she feels very well now and will continue to monitor for symptoms of bradycardia or hypotension overnight tonight.  She will call back with any new concerns.  Marcie Mowers, MD Cardiology Fellow, PGY-6

## 2018-05-27 ENCOUNTER — Encounter: Payer: Self-pay | Admitting: Internal Medicine

## 2018-05-27 ENCOUNTER — Ambulatory Visit: Payer: Medicare Other | Admitting: Internal Medicine

## 2018-05-27 VITALS — BP 118/78 | HR 63 | Ht 65.0 in | Wt 183.4 lb

## 2018-05-27 DIAGNOSIS — R002 Palpitations: Secondary | ICD-10-CM | POA: Diagnosis not present

## 2018-05-27 DIAGNOSIS — Z79899 Other long term (current) drug therapy: Secondary | ICD-10-CM | POA: Diagnosis not present

## 2018-05-27 DIAGNOSIS — I480 Paroxysmal atrial fibrillation: Secondary | ICD-10-CM

## 2018-05-27 LAB — CBC
Hematocrit: 43.5 % (ref 34.0–46.6)
Hemoglobin: 14.2 g/dL (ref 11.1–15.9)
MCH: 31.6 pg (ref 26.6–33.0)
MCHC: 32.6 g/dL (ref 31.5–35.7)
MCV: 97 fL (ref 79–97)
PLATELETS: 209 10*3/uL (ref 150–450)
RBC: 4.49 x10E6/uL (ref 3.77–5.28)
RDW: 12.2 % — AB (ref 12.3–15.4)
WBC: 3.7 10*3/uL (ref 3.4–10.8)

## 2018-05-27 LAB — BASIC METABOLIC PANEL
BUN / CREAT RATIO: 21 (ref 12–28)
BUN: 15 mg/dL (ref 8–27)
CO2: 24 mmol/L (ref 20–29)
Calcium: 9.4 mg/dL (ref 8.7–10.3)
Chloride: 105 mmol/L (ref 96–106)
Creatinine, Ser: 0.72 mg/dL (ref 0.57–1.00)
GFR calc non Af Amer: 86 mL/min/{1.73_m2} (ref >59)
GFR, EST AFRICAN AMERICAN: 99 mL/min/{1.73_m2} (ref >59)
GLUCOSE: 67 mg/dL (ref 65–99)
POTASSIUM: 4.4 mmol/L (ref 3.5–5.2)
Sodium: 142 mmol/L (ref 134–144)

## 2018-05-27 MED ORDER — FLECAINIDE ACETATE 100 MG PO TABS: 100.0000 mg | ORAL_TABLET | Freq: Two times a day (BID) | ORAL | 3 refills | Status: DC

## 2018-05-27 NOTE — Progress Notes (Signed)
Patient Care Team: Aletha Halim., PA-C as PCP - General (Family Medicine)   HPI  Sheri Sims is a 67 y.o. female Seen in followup for palpitations and atrial fibrillation confirmed by her AliveCor monitor. She also has blocked PACs resulting in functional bradycardia ( Saw AS-NP 10/19)  Started on Flecainide    She also has other symptomatic events which on monitoring ( and ECG today) shows quite variable PP intervals.  T     No bleeding on her Xarelto  DATE TEST EF   1/19 Echo   60-65 % TR mod  1/19 MYOVIEW  70 % No Ischemia           Date Cr K Hgb  1/19 0.6 5.2 14.3         DATE PR interval QRSduration Dose  4/19  130 66 0  11//19 144 72 50   She did great on the first few days on the flecainide.,  Feeling normal for the first days years.  She then had a terrible couple of days with shortness of breath and dizziness.  AliveCor monitor strips were reviewed (see below.). No dizziness following restoration of sinus rhythm  Records and Results Reviewed telemetry strips  Past Medical History:  Diagnosis Date  . Acute otitis media   . Costochondritis   . Enthesopathy of hip region   . Fatigue   . Fibromyalgia   . Helicobacter pylori infection   . Insomnia   . MVP (mitral valve prolapse)    OF ANTERIOR MITRAL VALVE LEAFLET  . Obesity   . Palpitations   . Pharyngitis   . Pneumonia   . Sinusitis, chronic     Past Surgical History:  Procedure Laterality Date  . APPENDECTOMY    . CARDIAC CATHETERIZATION  2010   Normal  . STRESS ECHO TEST  05/31/2010   NO ISCHEMIA  . TONSILLECTOMY    . TRANSTHORACIC ECHOCARDIOGRAM  05/16/2010   EF 55-60%    Current Meds  Medication Sig  . acetaminophen (TYLENOL 8 HOUR ARTHRITIS PAIN) 650 MG CR tablet Take 650 mg by mouth every 8 (eight) hours as needed for pain.   . Calcium 500 MG CHEW Chew 500 mg by mouth every other day.   . Cetirizine HCl (ZYRTEC ALLERGY) 10 MG CAPS Take 10 mg by mouth daily as needed  (allergies).   . cholecalciferol (VITAMIN D) 1000 UNITS tablet Take 1,000 Units by mouth daily.    . flecainide (TAMBOCOR) 50 MG tablet Take 1 tablet (50 mg total) by mouth 2 (two) times daily.  Marland Kitchen loratadine (CLARITIN) 10 MG tablet Take 10 mg by mouth daily as needed for allergies.  . metoprolol succinate (TOPROL-XL) 25 MG 24 hr tablet Take 1 tablet (25 mg total) by mouth daily.  . multivitamin (THERAGRAN) per tablet Take 1 tablet by mouth every other day.   . propranolol (INDERAL) 10 MG tablet Take 1 tablet (10 mg total) by mouth as needed. Take 1 tablet (10mg ) by mouth every hour x2 for heart palpitations.  . rivaroxaban (XARELTO) 20 MG TABS tablet Take 1 tablet (20 mg total) by mouth daily with supper.    Allergies  Allergen Reactions  . Codeine Nausea And Vomiting and Nausea Only  . Sulfur Dioxide Nausea Only  . Sulfites Nausea And Vomiting  . Codeine Phosphate Nausea And Vomiting  . Other     Prednisone cream caused blisters   . Sulfa Antibiotics Nausea And Vomiting  Review of Systems negative except from HPI and PMH  Physical Exam BP 118/78   Pulse 63   Ht 5\' 5"  (1.651 m)   Wt 183 lb 6.4 oz (83.2 kg)   SpO2 94%   BMI 30.52 kg/m  Well developed and nourished in no acute distress HENT normal Neck supple with JVP-flat Clear Regular rate and rhythm, no murmurs or gallops Abd-soft with active BS No Clubbing cyanosis edema Skin-warm and dry A & Oriented  Grossly normal sensory and motor function   ECG demonstrates sinus rhythm at 63 Intervals 14/07/38 Sinus arrhythmia versus premature atrial beats although the P wave morphologies are relatively similar  AliveCor telemetry reviewed.  Atrial fibrillation as well as atrial flutter with 2: 1 conduction  Assessment and  Plan  Atrial fibrillation/flutter-paroxysmal  Palpitations associated with PACs and variable PP intervals  Hyperkalemia-borderline   She has atrial flutter and fibrillation which are quite  symptomatic.  We discussed treatment options including increasing the dose of flecainide, trying alternative 1C agent i.e. propafenone, and or referral for catheter ablation.  We will increase her flecainide from 50--100.  The dizziness that she is having is absent today so I assume it was related to the presence of the arrhythmia.  She will need a GXT in a few weeks but it ECG prior to that to look at conduction intervals; they are quite within range this morning at just under 10% prolongation.  We will refer her to Dr. Greggory Brandy for consideration of catheter ablation.  On Anticoagulation;  No bleeding issues ;  Recheck K  We spent more than 50% of our >25 min visit in face to face counseling regarding the above        Current medicines are reviewed at length with the patient today .  The patient does not have concerns regarding medicines.

## 2018-05-27 NOTE — Patient Instructions (Signed)
Medication Instructions:  Your physician has recommended you make the following change in your medication:   1. Increase your Flecainide to 100mg , two times per day  Labwork: You will have labs drawn today:   CBC and BMP  Testing/Procedures: Your physician has requested that you have an exercise tolerance test. For further information please visit [email or web address]. Please also follow instruction sheet, as given.  Follow-Up: Your physician recommends that you schedule a follow-up appointment in:   RN visit on Wed with Klaire Court  First available with Dr. Rayann Heman to discuss a catheter ablation.  Dr Caryl Comes in 6 months   Any Other Special Instructions Will Be Listed Below (If Applicable).   If you need a refill on your cardiac medications before your next appointment, please call your pharmacy.

## 2018-05-29 ENCOUNTER — Ambulatory Visit (INDEPENDENT_AMBULATORY_CARE_PROVIDER_SITE_OTHER): Payer: Medicare Other

## 2018-05-29 ENCOUNTER — Telehealth: Payer: Self-pay | Admitting: *Deleted

## 2018-05-29 VITALS — BP 108/68 | HR 62 | Resp 16 | Ht 65.0 in | Wt 183.3 lb

## 2018-05-29 DIAGNOSIS — I480 Paroxysmal atrial fibrillation: Secondary | ICD-10-CM

## 2018-05-29 DIAGNOSIS — R002 Palpitations: Secondary | ICD-10-CM

## 2018-05-29 NOTE — Telephone Encounter (Signed)
Working in [employer].  Called pt re: lab results, left a message for pt to call back.

## 2018-05-29 NOTE — Telephone Encounter (Signed)
-----   Message from Deboraha Sprang, MD sent at 05/29/2018  8:58 AM EST ----- Please Inform Patient that labs are normal  Thanks

## 2018-05-29 NOTE — Progress Notes (Signed)
Pt presents today for a follow up EKG after an increase in her flecainide dose earlier this week.  Pt has no complaints of CP, SOB. Pt does have some complaints of heart palpitations, but has seen some improvement after increasing her flecainide dose on Tues.  Per Dr Lovena Le, pts EKG showed PAC's, otherwise normal. Pt should follow up with Dr Rayann Heman and Dr Caryl Comes as planned.

## 2018-06-13 ENCOUNTER — Ambulatory Visit: Payer: Medicare Other | Admitting: Internal Medicine

## 2018-06-13 ENCOUNTER — Encounter: Payer: Self-pay | Admitting: Internal Medicine

## 2018-06-13 ENCOUNTER — Ambulatory Visit (INDEPENDENT_AMBULATORY_CARE_PROVIDER_SITE_OTHER): Payer: Medicare Other

## 2018-06-13 VITALS — BP 116/68 | HR 68 | Ht 65.5 in | Wt 182.0 lb

## 2018-06-13 DIAGNOSIS — I480 Paroxysmal atrial fibrillation: Secondary | ICD-10-CM

## 2018-06-13 DIAGNOSIS — Z79899 Other long term (current) drug therapy: Secondary | ICD-10-CM | POA: Diagnosis not present

## 2018-06-13 DIAGNOSIS — R002 Palpitations: Secondary | ICD-10-CM | POA: Diagnosis not present

## 2018-06-13 LAB — EXERCISE TOLERANCE TEST
CHL CUP MPHR: 151 {beats}/min
CHL RATE OF PERCEIVED EXERTION: 15
CSEPED: 6 min
CSEPEDS: 24 s
CSEPEW: 7.5 METS
Peak HR: 117 {beats}/min
Percent HR: 77 %
Rest HR: 60 {beats}/min

## 2018-06-13 NOTE — Patient Instructions (Addendum)
Medication Instructions:  Your physician recommends that you continue on your current medications as directed. Please refer to the Current Medication list given to you today.  Labwork: None ordered.  Testing/Procedures: None ordered.  Follow-Up: Your physician wants you to follow-up in: 3 months with Donna Carroll NP at the afib clinic.      Any Other Special Instructions Will Be Listed Below (If Applicable).  If you need a refill on your cardiac medications before your next appointment, please call your pharmacy.   

## 2018-06-13 NOTE — Progress Notes (Signed)
Electrophysiology Office Note   Date:  06/13/2018   ID:  Sheri, Sims [birth year], MRN [id number]  PCP:  Sheri Sims., PA-C    Primary Electrophysiologist: Dr Sheri Sims   CC: afib   History of Present Illness: Sheri Sims is a 67 y.o. female who presents today for electrophysiology evaluation.   She has a h/o paroxysmal atrial fibrillation.  She reports having symptoms of palpitations and fatigue.  She also had associated dizziness and SOB.  She was diagnosed with afib 1/19 by event monitor.  In retrospect, ,she has had symptoms of palpitations since ages 35s.  More recently, she has been using AliveCor which also reveals that many of her symptoms are due to PACs.  Per Dr Sheri Sims, she has also had atrial flutter.  He recently started flecainide.  She is very pleased with results.  She denies palpitations, fatigue.  Her energy is much improved.  She denies side effects with this medicine.  Today, she denies symptoms of chest pain, shortness of breath, orthopnea, PND, lower extremity edema, claudication, dizziness, presyncope, syncope, bleeding, or neurologic sequela. The patient is tolerating medications without difficulties and is otherwise without complaint today.    Past Medical History:  Diagnosis Date  . Atrial flutter ([place])   . Costochondritis   . Enthesopathy of hip region   . Fatigue   . Fibromyalgia   . Helicobacter pylori infection   . Insomnia   . MVP (mitral valve prolapse)    OF ANTERIOR MITRAL VALVE LEAFLET  . Obesity   . Paroxysmal atrial fibrillation ([facility])   . Pharyngitis   . Pneumonia   . Sinusitis, chronic    Past Surgical History:  Procedure Laterality Date  . APPENDECTOMY    . CARDIAC CATHETERIZATION  2010   Normal  . STRESS ECHO TEST  05/31/2010   NO ISCHEMIA  . TONSILLECTOMY    . TRANSTHORACIC ECHOCARDIOGRAM  05/16/2010   EF 55-60%     Current Outpatient Medications  Medication Sig Dispense Refill  . acetaminophen (TYLENOL 8 HOUR  ARTHRITIS PAIN) 650 MG CR tablet Take 650 mg by mouth every 8 (eight) hours as needed for pain.     . Calcium 500 MG CHEW Chew 500 mg by mouth every other day.     . Cetirizine HCl (ZYRTEC ALLERGY) 10 MG CAPS Take 10 mg by mouth daily as needed (allergies).     . cholecalciferol (VITAMIN D) 1000 UNITS tablet Take 1,000 Units by mouth daily.      . flecainide (TAMBOCOR) 100 MG tablet Take 1 tablet (100 mg total) by mouth 2 (two) times daily. 180 tablet 3  . loratadine (CLARITIN) 10 MG tablet Take 10 mg by mouth daily as needed for allergies.    . metoprolol succinate (TOPROL-XL) 25 MG 24 hr tablet Take 1 tablet (25 mg total) by mouth daily. 30 tablet 2  . multivitamin (THERAGRAN) per tablet Take 1 tablet by mouth every other day.     . propranolol (INDERAL) 10 MG tablet Take 1 tablet (10 mg total) by mouth as needed. Take 1 tablet (10mg ) by mouth every hour x2 for heart palpitations. 30 tablet 6  . rivaroxaban (XARELTO) 20 MG TABS tablet Take 1 tablet (20 mg total) by mouth daily with supper. 30 tablet 11   No current facility-administered medications for this visit.     Allergies:   Codeine; Sulfur dioxide; Sulfites; Codeine phosphate; Other; and Sulfa antibiotics   Social History:  The patient  reports that she has never smoked. She has never used smokeless tobacco. She reports that she does not drink alcohol or use drugs.   Family History:  The patient's  family history includes Breast cancer in her mother and sister.    ROS:  Please see the history of present illness.   All other systems are personally reviewed and negative.    PHYSICAL EXAM: VS:  BP 116/68   Pulse 68   Ht 5' 5.5" (1.664 m)   Wt 182 lb (82.6 kg)   SpO2 99%   BMI 29.83 kg/m  , BMI Body mass index is 29.83 kg/m. GEN: Well nourished, well developed, in no acute distress  HEENT: normal  Neck: no JVD, carotid bruits, or masses Cardiac: RRR; no murmurs, rubs, or gallops,no edema  Respiratory:  clear to auscultation  bilaterally, normal work of breathing GI: soft, nontender, nondistended, + BS MS: no deformity or atrophy  Skin: warm and dry  Neuro:  Strength and sensation are intact Psych: euthymic mood, full affect  EKG:  EKG is ordered today. The ekg ordered today is personally reviewed and shows sinus rhythm 59 bpm, PR 174 msec, QRS 80 msec, Qtc 409 msec   Recent Labs: 07/11/2017: Magnesium 2.5; TSH 2.280 05/27/2018: BUN 15; Creatinine, Ser 0.72; Hemoglobin 14.2; Platelets 209; Potassium 4.4; Sodium 142  personally reviewed   Lipid Panel     Component Value Date/Time   CHOL  08/10/2008 0020    183        ATP III CLASSIFICATION:  <200     mg/dL   Desirable  200-239  mg/dL   Borderline High  >=240    mg/dL   High          TRIG 94 08/10/2008 0020   HDL 50 08/10/2008 0020   CHOLHDL 3.7 08/10/2008 0020   VLDL 19 08/10/2008 0020   LDLCALC (H) 08/10/2008 0020    114        Total Cholesterol/HDL:CHD Risk Coronary Heart Disease Risk Table                     Men   Women  1/2 Average Risk   3.4   3.3  Average Risk       5.0   4.4  2 X Average Risk   9.6   7.1  3 X Average Risk  23.4   11.0        Use the calculated Patient Ratio above and the CHD Risk Table to determine the patient's CHD Risk.        ATP III CLASSIFICATION (LDL):  <100     mg/dL   Optimal  100-129  mg/dL   Near or Above                    Optimal  130-159  mg/dL   Borderline  160-189  mg/dL   High  >190     mg/dL   Very High   personally reviewed   Wt Readings from Last 3 Encounters:  06/13/18 182 lb (82.6 kg)  05/29/18 183 lb 4.8 oz (83.1 kg)  05/27/18 183 lb 6.4 oz (83.2 kg)      Other studies personally reviewed: Additional studies/ records that were reviewed today include: Dr Sheri Sims notes, echo 07/19/17  Review of the above records today demonstrates: EF 60%, normal MV,  LA 34 mm  ETT from today is personally reviewed- no exercise induced arrhythmias or ischemic changes  ASSESSMENT AND PLAN:  1.   Paroxysmal atrial fibrillation and atrial flutter The patient has symptomatic, recurrent atrial fibrillation and atrial flutter.  She also appears to be quite symptomatic with PACs at times. Chads2vasc score is 2.  she is anticoagulated with xarelto.  With recent guidelines update, she could potentially stop anticoagulation.  I will defer this decision to Dr Sheri Sims. Therapeutic strategies for afib including medicine and ablation were discussed in detail with the patient today. Risk, benefits, and alternatives to EP study and radiofrequency ablation for afib were also discussed in detail today. At this time, she is very clear in her decision to decline ablation.  She is doing great with flecainide and wishes to continue this medicine  Follow-up:  AF clinic every 3 months and with Dr Sheri Sims as scheduled in 6 months I will see as needed  Current medicines are reviewed at length with the patient today.   The patient does not have concerns regarding her medicines.  The following changes were made today:  none  Labs/ tests ordered today include:  Orders Placed This Encounter  Procedures  . EKG 12-Lead     Signed, Thompson Grayer, MD  06/13/2018 11:14 AM     [place] [address] [address] [phone] (office) [phone] (fax)

## 2018-07-16 ENCOUNTER — Other Ambulatory Visit: Payer: Self-pay | Admitting: Internal Medicine

## 2018-07-17 NOTE — Telephone Encounter (Signed)
Xarelto 20mg  refill request received; pt is 68 yrs old, wt-82.6kg, Crea-0.72 on 05/27/2018, last seen by Dr. Rayann Heman on 06/13/2018 & Caryl Comes on 05/27/2018 , CrCl-96.76ml/min; will send in refill to requested pharmacy.

## 2018-08-19 ENCOUNTER — Ambulatory Visit: Payer: Medicare Other | Admitting: Internal Medicine

## 2018-08-19 ENCOUNTER — Encounter: Payer: Self-pay | Admitting: Internal Medicine

## 2018-08-19 DIAGNOSIS — J069 Acute upper respiratory infection, unspecified: Secondary | ICD-10-CM

## 2018-08-19 MED ORDER — AMOXICILLIN-POT CLAVULANATE 875-125 MG PO TABS: 1.0000 | ORAL_TABLET | Freq: Two times a day (BID) | ORAL | 0 refills | Status: AC

## 2018-08-19 MED ORDER — HYDROCODONE-HOMATROPINE 5-1.5 MG/5ML PO SYRP: 5.0000 mL | ORAL_SOLUTION | Freq: Four times a day (QID) | ORAL | 0 refills | Status: DC | PRN

## 2018-08-19 MED ORDER — PREDNISONE 10 MG PO TABS: ORAL_TABLET | ORAL | 0 refills | Status: DC

## 2018-08-19 NOTE — Patient Instructions (Addendum)
Augmentin 875 mg take one pill twice daily  X 10 days - take at breakfast and supper with large glass of water.  It would help reduce the usual side effects (diarrhea and yeast infections) if you ate cultured yogurt at lunch.    If not better >   Prednisone 10 mg take  4 each am x 2 days,   2 each am x 2 days,  1 each am x 2 days and stop    GERD (REFLUX)  is an extremely common cause of respiratory symptoms just like yours , many times with no obvious heartburn at all.    It can be treated with medication, but also with lifestyle changes including elevation of the head of your bed (ideally with 6 -8inch blocks under the headboard of your bed),  Smoking cessation, avoidance of late meals, excessive alcohol, and avoid fatty foods, chocolate, peppermint, colas, red wine, and acidic juices such as orange juice.  NO MINT OR MENTHOL PRODUCTS SO NO COUGH DROPS  USE SUGARLESS CANDY INSTEAD (Jolley ranchers or Stover's or Life Savers) or even ice chips will also do - the key is to swallow to prevent all throat clearing. NO OIL BASED VITAMINS - use powdered substitutes.  Avoid fish oil when coughing.   For cough > hycodan up to a tsp every 4 hours as needed    If you are satisfied with your treatment plan,  let your doctor know and he/she can either refill your medications or you can return here when your prescription runs out.     If in any way you are not 100% satisfied,  please tell us.  If 100% better, tell your friends!  Pulmonary follow up is as needed

## 2018-08-19 NOTE — Progress Notes (Signed)
Subjective:   Patient ID: Sheri Sims, female    DOB: [birth year]   MRN: [id number]    Brief patient profile:  68 yowf never smoker bad pna age 68 but did fine thereafter except for seasonal allergies responding to otcs with abrupt R side pleuritic cp/ chills Nov 03 2013 > [facility]  [facility] > dx RML pna admit x 2 days > complete resolution of R pain, chills and fever resolved but persistent fatigue and infiltrates RML gradually improving  But still present on  cxr 01/30/14 so referred to pulmonary clinic 02/13/2014 by Dr Kathryne Eriksson    History of Present Illness  02/13/2014 1st [facility] office visit/ Bransyn Adami  Chief Complaint  Patient presents with  . Pulmonary Consult    Referred per Dr. Kathryne Eriksson for eval of abn cxr. Pt states that she was dxed with PNA 70 months ago.  She c/o SOB with walking up and down stairs and if she bends over alot.   overall pattern was very abrupt onset of classic R lat pleuritic cp and much better by the time she was discharged and slow but steady improvement  in all symptoms since then . Never had hemoptysis or purulent sputum - more fatigue limiting activity than sob.  rec No change rx   03/24/2014 f/u ov/Dheeraj Hail re: f/u ? RML mass  Chief Complaint  Patient presents with  . Followup with CXR    Pt states that she is doing well today and denies any new co's.   Not limited by breathing from desired activities   rec - CT chest  03/31/14 Largely wedge shaped peripheral airspace opacity in the right middle lobe most likely post pneumonic atelectasis.   - CT 07/07/2014 >  01/18/2015 no change  - CT 03/15/2016 1. Essentially stable appearance of an irregular shaped mass-like area in the lateral segment of the right middle lobe, which now demonstrates 2 years of stability. This is strongly favored to represent a benign area, likely chronic mucoid impaction within dilated distal bronchi with surrounding scarring and atelectasis.> no f/u needed unless new  symptoms    10/12/2016  f/u ov/Zakeya Junker re: re-establish re RML infiltrates  New location plus prev residual changes  Chief Complaint  Patient presents with  . Acute Visit    last seen 03/2014- pt seen last week by [facility] for PNA. Concern by pt for pleurisy recurrance.   100% prev symptoms resolved , then acutely ill on mid march 2018 sore throat / fever / stuffy nose with yellow discharge / then cough esp in am dark yellow > over about 10 days improved  > minimal residual cough never received abx  Then acutely new location (from prev presumed pna) R pleuritic  cp near previous location then CTa  10/06/16  Hanley Seamen showed same are smaller plus new area on pna > rx IV abx /levaquin x 10 days planned new Lie L side down hurts on R - overall pain only sltly better on abx than at its peak 10/06/16 and no longer hurts to breath deeply  Taking advil 200 up to one daily only / has stronger pain pill but not taking and doesn't know the name rec Finish your antibiotics as planned Take advil up to 3 with meals as needed for pain with breathing or coughing or different positions Please see patient coordinator before you leave today  to schedule CT with contrast in one month and ov same day      11/15/2016  f/u ov/Vinson Tietze re: RML syndrome/ fibromyalgia  Chief Complaint  Patient presents with  . Follow-up    Discuss CT Chest. She only c/o occ cough. No new co's today.   Not limited by breathing from desired activities/ cough is minimal dry    rec F/u prn     09/03/2017  f/u ov/Mclean Moya re:  RML syndrome/ fibromyalgia / transient pleuritic cp/ same location as before/ pt on xarelto Chief Complaint  Patient presents with  . Follow-up    had a flare of cough in the Fall of 2018 and then again feb 2019- cxr was done and she is concerned about "spot".  She states that she has been having some pain in her teeth and PND.   Dyspnea:  Limited my fatigue not cp/sob Cough: no Sleep: no resp issues  Did have some  discomfort R lateral and respond  advil in fall 2018 and then again similar pain pred/abx  Tooth pain above c/w chronic TMJ already eval and has fibromyalgia as well but the pain in the R lateral c/w is classic pleuritic, localized, same as in past and worse lying down s assoc sob and better since rx as  CAP rec No change rx   05/01/2018 acute extended ov/Daiden Coltrane re: ? Flare of rml syndrome vs fibromyalgia  Chief Complaint  Patient presents with  . Acute Visit    woke up right side back pain "a little higher than the pleurisy spot".  She states it hurts to take a deep breath. She has had some cough "with a raspy sound"- prod with very little sputum and she is unsure of color.    chronic/  recurrent pain R cw pain sev times x a week always goes away after   p tylenol while maint on xarelto for afib.   Then new slt more superior R cw  pain am of ov assoc with increased  cough since 04/27/18 min prod s fever or sob . rec Augmentin 875 mg take one pill twice daily  X 10 days - take at breakfast and supper with large glass of water.  It would help reduce the usual side effects (diarrhea and yeast infections) if you ate cultured yogurt at lunch.  For cough > hydromet 1 tsp every 4 hours as needed > never took it (husband took it)  Call if not 100% better in 2 weeks      08/19/2018 acute extended ov/Alyxis Grippi re: acute cough  Chief Complaint  Patient presents with  . Acute Visit    Pt c/o cough since 08/09/2018. Cough is currently prod with green to yellow sputum. She also c/o runny nose and sneezin.  husband was sick then 2/7 pt noted acute onset sore throat cough burning/ raw sensation over ant chest exp when out in cold  Only sob with cough Cough worse in evenings and with exp to  Cold air/   But sleeps ok / worse first thing in am   No obvious other patterns in  day to day or daytime variability or assoc mucus plugs or hemoptysis or cp or chest tightness, subjective wheeze or overt sinus or hb  symptoms.   Sleeping  without nocturnal    exacerbation  of respiratory  c/o's or need for noct saba. Also denies any obvious fluctuation of symptoms with weather or environmental changes or other aggravating or alleviating factors except as outlined above   No unusual exposure hx or h/o childhood pna/ asthma or knowledge of premature birth.  Current Allergies, Complete Past Medical History, Past Surgical History, Family History, and Social History were reviewed in [employer] record.  ROS  The following are not active complaints unless bolded Hoarseness, sore throat, dysphagia, dental problems, itching, sneezing,  nasal congestion or discharge of excess mucus or purulent secretions, ear ache,   fever, chills, sweats, unintended wt loss or wt gain, classically pleuritic or exertional cp,  orthopnea pnd or arm/hand swelling  or leg swelling, presyncope, palpitations, abdominal pain, anorexia, nausea, vomiting, diarrhea  or change in bowel habits or change in bladder habits, change in stools or change in urine, dysuria, hematuria,  rash, arthralgias, visual complaints, headache, numbness, weakness or ataxia or problems with walking or coordination,  change in mood or  memory.        Current Meds  Medication Sig  . acetaminophen (TYLENOL 8 HOUR ARTHRITIS PAIN) 650 MG CR tablet Take 650 mg by mouth every 8 (eight) hours as needed for pain.   . Calcium 500 MG CHEW Chew 500 mg by mouth every other day.   . Cetirizine HCl (ZYRTEC ALLERGY) 10 MG CAPS Take 10 mg by mouth daily as needed (allergies).   . cholecalciferol (VITAMIN D) 1000 UNITS tablet Take 1,000 Units by mouth daily.    . flecainide (TAMBOCOR) 100 MG tablet Take 1 tablet (100 mg total) by mouth 2 (two) times daily.  Marland Kitchen HYDROcodone-homatropine (HYDROMET) 5-1.5 MG/5ML syrup Take 5 mLs by mouth every 6 (six) hours as needed for cough.  . loratadine (CLARITIN) 10 MG tablet Take 10 mg by mouth daily as needed for allergies.   . metoprolol succinate (TOPROL-XL) 25 MG 24 hr tablet Take 1 tablet (25 mg total) by mouth daily.  . multivitamin (THERAGRAN) per tablet Take 1 tablet by mouth every other day.   . propranolol (INDERAL) 10 MG tablet Take 1 tablet (10 mg total) by mouth as needed. Take 1 tablet (10mg ) by mouth every hour x2 for heart palpitations.  Alveda Reasons 20 MG TABS tablet TAKE 1 TABLET BY MOUTH EVERY DAY  .                    Objective:   Physical Exam    Somber amb wf nad   Vital signs reviewed - Note on arrival 02 sats  97% on RA    08/19/2018       183 05/01/2018     183  09/03/2017         179  11/15/2016       179  10/12/2016       176  03/24/14           188       mod TE   HEENT: nl dentition,   and oropharynx. Nl external ear canals without cough reflex -moderate  bilateral non-specific turbinate edema  s purulent secretions  NECK :  without JVD/Nodes/TM/ nl carotid upstrokes bilaterally   LUNGS: no acc muscle use,  Nl contour chest which is clear to A and P bilaterally without cough on insp or exp maneuvers   CV:  RRR  no s3 or murmur or increase in P2, and no edema   ABD:  soft and nontender with nl inspiratory excursion in the supine position. No bruits or organomegaly appreciated, bowel sounds nl  MS:  Nl gait/ ext warm without deformities, calf tenderness, cyanosis or clubbing No obvious joint restrictions   SKIN: warm and dry without lesions    NEURO:  alert, approp, nl sensorium with  no motor or cerebellar deficits apparent.          Assessment & Plan:

## 2018-08-20 ENCOUNTER — Encounter: Payer: Self-pay | Admitting: Internal Medicine

## 2018-08-20 DIAGNOSIS — J069 Acute upper respiratory infection, unspecified: Secondary | ICD-10-CM | POA: Insufficient documentation

## 2018-08-20 NOTE — Assessment & Plan Note (Signed)
Continues with severe coughing fits 11 days since onset which is still w/in expected range for uri but the purulent secretions are suggestive of superimposed acute sinusitis perhaps with evolving sinobronchial syndrome   rec Augmentin 875 mg take one pill twice daily  X 10 days  If not improving > pred x 6 d Suppress cyclical coughing with hydromet/ gerd diet reviewed F/u if not all better in 2 weeks    I had an extended discussion with the patient reviewing all relevant studies completed to date and  lasting 15 to 20 minutes of a 25 minute acute office  visit    Each maintenance medication was reviewed in detail including most importantly the difference between maintenance and prns and under what circumstances the prns are to be triggered using an action plan format that is not reflected in the computer generated alphabetically organized AVS.     Please see AVS for specific instructions unique to this visit that I personally wrote and verbalized to the the pt in detail and then reviewed with pt  by my nurse highlighting any  changes in therapy recommended at today's visit to their plan of care.

## 2018-09-12 ENCOUNTER — Other Ambulatory Visit: Payer: Self-pay

## 2018-09-12 ENCOUNTER — Ambulatory Visit (HOSPITAL_COMMUNITY)
Admission: RE | Admit: 2018-09-12 | Discharge: 2018-09-12 | Disposition: A | Discharge status: Home / Self Care | Payer: Medicare Other | Source: Ambulatory Visit | Attending: Nurse Practitioner | Admitting: Nurse Practitioner

## 2018-09-12 ENCOUNTER — Encounter (HOSPITAL_COMMUNITY): Payer: Self-pay | Admitting: Physician Assistant

## 2018-09-12 VITALS — BP 122/74 | HR 66 | Ht 65.5 in | Wt 183.8 lb

## 2018-09-12 DIAGNOSIS — I341 Nonrheumatic mitral (valve) prolapse: Secondary | ICD-10-CM | POA: Diagnosis not present

## 2018-09-12 DIAGNOSIS — G4700 Insomnia, unspecified: Secondary | ICD-10-CM | POA: Insufficient documentation

## 2018-09-12 DIAGNOSIS — Z7901 Long term (current) use of anticoagulants: Secondary | ICD-10-CM | POA: Insufficient documentation

## 2018-09-12 DIAGNOSIS — E669 Obesity, unspecified: Secondary | ICD-10-CM | POA: Insufficient documentation

## 2018-09-12 DIAGNOSIS — I4892 Unspecified atrial flutter: Secondary | ICD-10-CM | POA: Insufficient documentation

## 2018-09-12 DIAGNOSIS — Z888 Allergy status to other drugs, medicaments and biological substances status: Secondary | ICD-10-CM | POA: Diagnosis not present

## 2018-09-12 DIAGNOSIS — Z882 Allergy status to sulfonamides status: Secondary | ICD-10-CM | POA: Diagnosis not present

## 2018-09-12 DIAGNOSIS — M797 Fibromyalgia: Secondary | ICD-10-CM | POA: Diagnosis not present

## 2018-09-12 DIAGNOSIS — Z79899 Other long term (current) drug therapy: Secondary | ICD-10-CM | POA: Diagnosis not present

## 2018-09-12 DIAGNOSIS — Z885 Allergy status to narcotic agent status: Secondary | ICD-10-CM | POA: Diagnosis not present

## 2018-09-12 DIAGNOSIS — I480 Paroxysmal atrial fibrillation: Secondary | ICD-10-CM

## 2018-09-12 DIAGNOSIS — Z6830 Body mass index (BMI) 30.0-30.9, adult: Secondary | ICD-10-CM | POA: Insufficient documentation

## 2018-09-12 NOTE — Progress Notes (Signed)
Primary Care Physician: Aletha Halim., PA-C Primary Electrophysiologist: Dr Caryl Comes Referring Physician: Dr Gaylan Gerold is a 68 y.o. female with a history of paroxysmal atrial fibrillation, atrial flutter, and MVP who presents for consultation in the [facility].  The patient's flecainide was recently increased and since that time she has felt very well. She reports her energy level and exercise tolerance have greatly improved.  Today, she denies symptoms of palpitations, chest pain, shortness of breath, orthopnea, PND, lower extremity edema, dizziness, presyncope, syncope, snoring, daytime somnolence, bleeding, or neurologic sequela. The patient is tolerating medications without difficulties and is otherwise without complaint today.    Atrial Fibrillation Risk Factors:  she does not have symptoms or diagnosis of sleep apnea. she does not have a history of rheumatic fever. she does not have a history of alcohol use. The patient does not have a history of early familial atrial fibrillation or other arrhythmias.  she has a BMI of Body mass index is 30.12 kg/m.Marland Kitchen Filed Weights   09/12/18 1022  Weight: 83.4 kg    Family History  Problem Relation Age of Onset  . Breast cancer Mother   . Breast cancer Sister      Atrial Fibrillation Management history:  Previous antiarrhythmic drugs: flecainide Previous cardioversions: none Previous ablations: none CHADS2VASC score: 2 (age, female) Anticoagulation history: Xarelto   Past Medical History:  Diagnosis Date  . Atrial flutter ([facility])   . Costochondritis   . Enthesopathy of hip region   . Fatigue   . Fibromyalgia   . Helicobacter pylori infection   . Insomnia   . MVP (mitral valve prolapse)    OF ANTERIOR MITRAL VALVE LEAFLET  . Obesity   . Paroxysmal atrial fibrillation ([facility])   . Pharyngitis   . Pneumonia   . Sinusitis, chronic    Past Surgical History:  Procedure Laterality  Date  . APPENDECTOMY    . CARDIAC CATHETERIZATION  2010   Normal  . STRESS ECHO TEST  05/31/2010   NO ISCHEMIA  . TONSILLECTOMY    . TRANSTHORACIC ECHOCARDIOGRAM  05/16/2010   EF 55-60%    Current Outpatient Medications  Medication Sig Dispense Refill  . acetaminophen (TYLENOL 8 HOUR ARTHRITIS PAIN) 650 MG CR tablet Take 650 mg by mouth every 8 (eight) hours as needed for pain.     . Calcium 500 MG CHEW Chew 500 mg by mouth every other day.     . Cetirizine HCl (ZYRTEC ALLERGY) 10 MG CAPS Take 10 mg by mouth daily as needed (allergies).     . cholecalciferol (VITAMIN D) 1000 UNITS tablet Take 1,000 Units by mouth daily.      . flecainide (TAMBOCOR) 100 MG tablet Take 1 tablet (100 mg total) by mouth 2 (two) times daily. 180 tablet 3  . loratadine (CLARITIN) 10 MG tablet Take 10 mg by mouth daily as needed for allergies.    . metoprolol succinate (TOPROL-XL) 25 MG 24 hr tablet Take 1 tablet (25 mg total) by mouth daily. 30 tablet 2  . multivitamin (THERAGRAN) per tablet Take 1 tablet by mouth every other day.     . propranolol (INDERAL) 10 MG tablet Take 1 tablet (10 mg total) by mouth as needed. Take 1 tablet (10mg ) by mouth every hour x2 for heart palpitations. 30 tablet 6  . XARELTO 20 MG TABS tablet TAKE 1 TABLET BY MOUTH EVERY DAY 90 tablet 2  . HYDROcodone-homatropine (HYDROMET) 5-1.5 MG/5ML  syrup Take 5 mLs by mouth every 6 (six) hours as needed for cough. (Patient not taking: Reported on 09/12/2018) 240 mL 0   No current facility-administered medications for this encounter.     Allergies  Allergen Reactions  . Codeine Nausea And Vomiting and Nausea Only  . Sulfur Dioxide Nausea Only  . Sulfites Nausea And Vomiting  . Codeine Phosphate Nausea And Vomiting  . Other     Prednisone cream caused blisters   . Sulfa Antibiotics Nausea And Vomiting    Social History   Socioeconomic History  . Marital status: Married    Spouse name: Not on file  . Number of children: 2  .  Years of education: Not on file  . Highest education level: Not on file  Occupational History  . Occupation: Retired Ecologist  . Financial resource strain: Not on file  . Food insecurity:    Worry: Not on file    Inability: Not on file  . Transportation needs:    Medical: Not on file    Non-medical: Not on file  Tobacco Use  . Smoking status: Never Smoker  . Smokeless tobacco: Never Used  Substance and Sexual Activity  . Alcohol use: No  . Drug use: No  . Sexual activity: Not on file  Lifestyle  . Physical activity:    Days per week: Not on file    Minutes per session: Not on file  . Stress: Not on file  Relationships  . Social connections:    Talks on phone: Not on file    Gets together: Not on file    Attends religious service: Not on file    Active member of club or organization: Not on file    Attends meetings of clubs or organizations: Not on file    Relationship status: Not on file  . Intimate partner violence:    Fear of current or ex partner: Not on file    Emotionally abused: Not on file    Physically abused: Not on file    Forced sexual activity: Not on file  Other Topics Concern  . Not on file  Social History Narrative  . Not on file     ROS- All systems are reviewed and negative except as per the HPI above.  Physical Exam: Vitals:   09/12/18 1022  BP: 122/74  Pulse: 66  Weight: 83.4 kg  Height: 5' 5.5" (1.664 m)    GEN- The patient is well appearing, alert and oriented x 3 today.   Head- normocephalic, atraumatic Eyes-  Sclera clear, conjunctiva pink Ears- hearing intact Oropharynx- clear Neck- supple  Lungs- Clear to ausculation bilaterally, normal work of breathing Heart- Regular rate and rhythm, no murmurs, rubs or gallops  GI- soft, NT, ND, + BS Extremities- no clubbing, cyanosis, or edema MS- no significant deformity or atrophy Skin- no rash or lesion Psych- euthymic mood, full affect Neuro- strength and sensation are  intact  Wt Readings from Last 3 Encounters:  09/12/18 83.4 kg  08/19/18 83 kg  06/13/18 82.6 kg    EKG today demonstrates SR HR 66, PAC, PR 170, QRS 72, QTc 421  Echo 07/19/17 demonstrated  - Left ventricle: The cavity size was normal. Wall thickness was   normal. Systolic function was normal. The estimated ejection   fraction was in the range of 60% to 65%. Wall motion was normal;   there were no regional wall motion abnormalities. Features are   consistent with a  pseudonormal left ventricular filling pattern,   with concomitant abnormal relaxation and increased filling   pressure (grade 2 diastolic dysfunction). - Tricuspid valve: There was moderate regurgitation. - Pulmonary arteries: PA peak pressure: 31 mm Hg (S).  Epic records are reviewed at length today  Assessment and Plan:  1. Paroxysmal atrial fibrillation/atrial flutter The patient has paroxysmal atrial fibrillation and atrial flutter. Patient reports symptomatic improvent on higher dose of flecainde. Patient does not want to pursue ablation at this time. Continue flecainide 100 mg BID Continue Toprol 25 mg daily Continue Xarelto 20 mg daily for now.  This patients CHA2DS2-VASc Score and unadjusted Ischemic Stroke Rate (% per year) is equal to 2.2 % stroke rate/year from a score of 2  Above score calculated as 1 point each if present [CHF, HTN, DM, Vascular=MI/PAD/Aortic Plaque, Age if 65-74, or Female] Above score calculated as 2 points each if present [Age > 75, or Stroke/TIA/TE]   2. Obesity Body mass index is 30.12 kg/m. Lifestyle modification was discussed at length including regular exercise and weight reduction.  Follow up with Dr Caryl Comes as scheduled.   [facility] [address] [place], [place] [address] [phone] 09/12/2018 1:05 PM

## 2019-02-27 NOTE — Progress Notes (Signed)
Cardiology Office Note Date:  02/28/2019  Patient ID:  Sheri Sims, Sheri Sims Jun 30, 1951, MRN [id number] PCP:  Aletha Halim., PA-C  Eklectrophysiologist:  Dr. Caryl Comes    Chief Complaint:   over due visit  History of Present Illness: Sheri Sims is a 68 y.o. female with history of fibromyalgia, obesity and paroxysmal Afib/flutter.  She comes in today to be seen for Dr. Caryl Comes.  Last seen by him Noc 2019.  She saw Dr. Rayann Heman Dec 2019, at that time he notes she is quite symptomatic with her AFib/flutter as well as PACs, though symptoms much improved with Flecainide and planned to continue medical management.  He mentions CHADs2Vasc was 2, and by newer guidelines perhaps could come off a/c, this deferred to Dr. Caryl Comes.  She had f/u with the AFib clinic in march 2020, continued to feel very well with the Flecainide and no changes were made, not wanting to pursue ablation given success with AAD.  EKG with stable intervals.   She is doing very well.  She has not felt like she has had AFib since being on the Flecainide.  She will infrequently get a slight sensation in her heart beat like she may go out of rhythm, though doesn't and passes quickly.  She does not formally exercise, though keeps busy in/;around her home, and cares for her 4 grandchildren several days per week that keeps her moving.   No CP, dizzy spells, near syncope or syncope.  She has some degree of baseline DOE that is minimal at most and follows with Dr. Melvyn Novas w/hx of recurrent pneumonia/URIs, "pleurisy".  No unusual SOB/DOE.   She uses the metoprolol daily as prescribed, has the propanolol for PRN use for palpitations taht she has never felt the need to use She is doing OK with her Xarelto   AFib Hx Diagnosed jan 2019 (symptoms date back years) AAD Flecainide 06/2018 referred to Dr. Rayann Heman, at that time she was doing well with Flecainide and no plans to pursue ablation   Past Medical History:  Diagnosis Date  . Atrial  flutter ([place])   . Costochondritis   . Enthesopathy of hip region   . Fatigue   . Fibromyalgia   . Helicobacter pylori infection   . Insomnia   . MVP (mitral valve prolapse)    OF ANTERIOR MITRAL VALVE LEAFLET  . Obesity   . Paroxysmal atrial fibrillation (HCC)   . Pharyngitis   . Pneumonia   . Sinusitis, chronic     Past Surgical History:  Procedure Laterality Date  . APPENDECTOMY    . CARDIAC CATHETERIZATION  2010   Normal  . STRESS ECHO TEST  05/31/2010   NO ISCHEMIA  . TONSILLECTOMY    . TRANSTHORACIC ECHOCARDIOGRAM  05/16/2010   EF 55-60%    Current Outpatient Medications  Medication Sig Dispense Refill  . acetaminophen (TYLENOL 8 HOUR ARTHRITIS PAIN) 650 MG CR tablet Take 650 mg by mouth every 8 (eight) hours as needed for pain.     . Calcium 500 MG CHEW Chew 500 mg by mouth every other day.     . Cetirizine HCl (ZYRTEC ALLERGY) 10 MG CAPS Take 10 mg by mouth daily as needed (allergies).     . cholecalciferol (VITAMIN D) 1000 UNITS tablet Take 1,000 Units by mouth daily.      . flecainide (TAMBOCOR) 100 MG tablet Take 1 tablet (100 mg total) by mouth 2 (two) times daily. 180 tablet 3  . HYDROcodone-homatropine (HYDROMET) 5-1.5  MG/5ML syrup Take 5 mLs by mouth every 6 (six) hours as needed for cough. 240 mL 0  . loratadine (CLARITIN) 10 MG tablet Take 10 mg by mouth daily as needed for allergies.    . metoprolol succinate (TOPROL-XL) 25 MG 24 hr tablet Take 1 tablet (25 mg total) by mouth daily. 30 tablet 2  . multivitamin (THERAGRAN) per tablet Take 1 tablet by mouth every other day.     . propranolol (INDERAL) 10 MG tablet Take 1 tablet (10 mg total) by mouth as needed. Take 1 tablet (10mg ) by mouth every hour x2 for heart palpitations. 30 tablet 6  . XARELTO 20 MG TABS tablet TAKE 1 TABLET BY MOUTH EVERY DAY 90 tablet 2   No current facility-administered medications for this visit.     Allergies:   Codeine, Sulfur dioxide, Sulfites, Codeine phosphate, Other, and  Sulfa antibiotics   Social History:  The patient  reports that she has never smoked. She has never used smokeless tobacco. She reports that she does not drink alcohol or use drugs.   Family History:  The patient's family history includes Breast cancer in her mother and sister.  ROS:  Please see the history of present illness. All other systems are reviewed and otherwise negative.   PHYSICAL EXAM:  VS:  BP 134/82   Pulse 65   Ht 5' 5.5" (1.664 m)   Wt 188 lb (85.3 kg)   BMI 30.81 kg/m  BMI: Body mass index is 30.81 kg/m. Well nourished, well developed, in no acute distress  HEENT: normocephalic, atraumatic  Neck: no JVD, carotid bruits or masses Cardiac:  RRR; no significant murmurs, no rubs, or gallops Lungs:  CTA b/l, no wheezing, rhonchi or rales  Abd: soft, nontender MS: no deformity or atrophy Ext:  no edema  Skin: warm and dry, no rash Neuro:  No gross deficits appreciated Psych: euthymic mood, full affect    EKG:  Done today and reviewed by myself shows SR 65bpm, PR 152ms, QRS 67ms, Qtc 470ms   07/1717: TTE Study Conclusions - Left ventricle: The cavity size was normal. Wall thickness was   normal. Systolic function was normal. The estimated ejection   fraction was in the range of 60% to 65%. Wall motion was normal;   there were no regional wall motion abnormalities. Features are   consistent with a pseudonormal left ventricular filling pattern,   with concomitant abnormal relaxation and increased filling   pressure (grade 2 diastolic dysfunction). - Tricuspid valve: There was moderate regurgitation. - Pulmonary arteries: PA peak pressure: 31 mm Hg (S).     Recent Labs: 05/27/2018: BUN 15; Creatinine, Ser 0.72; Hemoglobin 14.2; Platelets 209; Potassium 4.4; Sodium 142  No results found for requested labs within last 8760 hours.   CrCl cannot be calculated (Patient's most recent lab result is older than the maximum 21 days allowed.).   Wt Readings from Last  3 Encounters:  02/28/19 188 lb (85.3 kg)  09/12/18 183 lb 12.8 oz (83.4 kg)  08/19/18 183 lb (83 kg)     Other studies reviewed: Additional studies/records reviewed today include: summarized above  ASSESSMENT AND PLAN:  1. Paroxysmal Afib     CHA2DS2Vasc is 2 (age/female), on Xarelto appropriately dosed by last labs     On flecainide/metoprolol     Stable intervals     No symptoms of AFib since on Flecainide   She is due to see her PMD in the next couple weeks, she thinks she  will have annual labs done, though uncertain.  She should have BMET/CBC to update for her xatrelto, if they do not do labs she will let us know and will have them done with [place]    Disposition: F/u with [place] in 6 months, sooner if needed.    Current medicines are reviewed at length with the patient today.  The patient did not have any concerns regarding medicines.  Venetia Night, PA-C 02/28/2019 9:26 AM     [address] [place] [address] [place] [address] [phone] (office)  [phone] (fax)

## 2019-02-28 ENCOUNTER — Encounter: Payer: Self-pay | Admitting: Physician Assistant

## 2019-02-28 ENCOUNTER — Other Ambulatory Visit: Payer: Self-pay

## 2019-02-28 ENCOUNTER — Ambulatory Visit (INDEPENDENT_AMBULATORY_CARE_PROVIDER_SITE_OTHER): Payer: Medicare Other | Admitting: Physician Assistant

## 2019-02-28 VITALS — BP 134/82 | HR 65 | Ht 65.5 in | Wt 188.0 lb

## 2019-02-28 DIAGNOSIS — Z79899 Other long term (current) drug therapy: Secondary | ICD-10-CM | POA: Diagnosis not present

## 2019-02-28 DIAGNOSIS — I480 Paroxysmal atrial fibrillation: Secondary | ICD-10-CM | POA: Diagnosis not present

## 2019-02-28 MED ORDER — PROPRANOLOL HCL 10 MG PO TABS: 10.0000 mg | ORAL_TABLET | ORAL | 6 refills | Status: DC | PRN

## 2019-02-28 NOTE — Patient Instructions (Signed)
Medication Instructions:  Your physician recommends that you continue on your current medications as directed. Please refer to the Current Medication list given to you today.  If you need a refill on your cardiac medications before your next appointment, please call your pharmacy.   Lab work: NONE ORDERED  TODAY   If you have labs (blood work) drawn today and your tests are completely normal, you will receive your results only by: Marland Kitchen MyChart Message (if you have MyChart) OR . A paper copy in the mail If you have any lab test that is abnormal or we need to change your treatment, we will call you to review the results.  Testing/Procedures: NONE ORDERED  TODAY  Follow-Up: At [facility], you and your health needs are our priority.  As part of our continuing mission to provide you with exceptional heart care, we have created designated Provider Care Teams.  These Care Teams include your primary Cardiologist (physician) and Advanced Practice Providers (APPs -  Physician Assistants and Nurse Practitioners) who all work together to provide you with the care you need, when you need it. You will need a follow up appointment in 6 months.  Please call our office 2 months in advance to schedule this appointment.  You may see  Dr. Caryl Comes. or one of the following Advanced Practice Providers on your designated Care Team: Tommye Standard, PA-C  Any Other Special Instructions Will Be Listed Below (If Applicable).

## 2019-03-26 ENCOUNTER — Other Ambulatory Visit: Payer: Self-pay | Admitting: Internal Medicine

## 2019-03-26 ENCOUNTER — Other Ambulatory Visit: Payer: Self-pay | Admitting: *Deleted

## 2019-03-26 DIAGNOSIS — Z79899 Other long term (current) drug therapy: Secondary | ICD-10-CM

## 2019-03-26 DIAGNOSIS — R002 Palpitations: Secondary | ICD-10-CM

## 2019-03-26 DIAGNOSIS — I480 Paroxysmal atrial fibrillation: Secondary | ICD-10-CM

## 2019-03-26 MED ORDER — FLECAINIDE ACETATE 100 MG PO TABS: 100.0000 mg | ORAL_TABLET | Freq: Two times a day (BID) | ORAL | 3 refills | Status: DC

## 2019-03-26 MED ORDER — RIVAROXABAN 20 MG PO TABS: 20.0000 mg | ORAL_TABLET | Freq: Every day | ORAL | 1 refills | Status: DC

## 2019-03-26 NOTE — Telephone Encounter (Signed)
Prescription refill request for Xarelto received.   Last office visit: Sheri Sims (02-28-2019) Weight: 85.3 kg  Age: 68 y.o. Scr: 0.72 (05-27-2018) CrCl: 98 ml/min  Prescription refill sent

## 2019-08-15 ENCOUNTER — Ambulatory Visit: Payer: Medicare PPO | Attending: Internal Medicine

## 2019-08-15 DIAGNOSIS — Z23 Encounter for immunization: Secondary | ICD-10-CM | POA: Insufficient documentation

## 2019-08-15 NOTE — Progress Notes (Signed)
   Covid-19 Vaccination Clinic  Name:  Sheri Sims    MRN: [id number] DOB: 04-04-1951  08/15/2019  [name] was observed post Covid-19 immunization for 15 minutes without incidence. She was provided with Vaccine Information Sheet and instruction to access the V-Safe system.   [name] was instructed to call 911 with any severe reactions post vaccine: Marland Kitchen Difficulty breathing  . Swelling of your face and throat  . A fast heartbeat  . A bad rash all over your body  . Dizziness and weakness    Immunizations Administered    Name Date Dose VIS Date Route   Pfizer COVID-19 Vaccine 08/15/2019  5:15 PM 0.3 mL 06/13/2019 Intramuscular   Manufacturer: [place]   Lot: [id number]   [employer]: [id number]

## 2019-09-08 ENCOUNTER — Ambulatory Visit: Payer: Medicare PPO | Attending: Internal Medicine

## 2019-09-08 DIAGNOSIS — Z23 Encounter for immunization: Secondary | ICD-10-CM | POA: Insufficient documentation

## 2019-09-08 NOTE — Progress Notes (Signed)
   Covid-19 Vaccination Clinic  Name:  Sheri Sims    MRN: [id number] DOB: 20-Dec-1950  09/08/2019  [name] was observed post Covid-19 immunization for 15 minutes without incident. She was provided with Vaccine Information Sheet and instruction to access the V-Safe system.   [name] was instructed to call 911 with any severe reactions post vaccine: Marland Kitchen Difficulty breathing  . Swelling of face and throat  . A fast heartbeat  . A bad rash all over body  . Dizziness and weakness   Immunizations Administered    Name Date Dose VIS Date Route   Pfizer COVID-19 Vaccine 09/08/2019  1:24 PM 0.3 mL 06/13/2019 Intramuscular   Manufacturer: [place]   Lot: [id number]   [place]: [id number]

## 2019-09-22 ENCOUNTER — Other Ambulatory Visit: Payer: Self-pay | Admitting: Internal Medicine

## 2019-09-22 DIAGNOSIS — I480 Paroxysmal atrial fibrillation: Secondary | ICD-10-CM | POA: Insufficient documentation

## 2019-09-22 DIAGNOSIS — I4891 Unspecified atrial fibrillation: Secondary | ICD-10-CM | POA: Insufficient documentation

## 2019-09-22 NOTE — Telephone Encounter (Addendum)
Xarelto 20mg  refill request received. Pt is 69 years old, weight- 85.3kg, Crea-0.72 on 05/27/2018-NEEDS UPDATED LABS, last seen by Tommye Standard on 02/28/2019 and has an appt for Dr. Caryl Comes on 09/24/2019, Diagnosis-Afib, CrCl using last labs is 96.46ml/min; Dose is appropriate based on dosing criteria.   Pt will need updated labs to ensure correct doseage. Pt has an appt scheduled with Dr. Caryl Comes on 09/24/2019-note placed on appt to obtain labs-CBC and BMET. Will send a supply for the pt and once labs are obtained will reevaluate.    Found some updated labs in [place] from [place]: Age-70yrs old, Crea-0.71 on 03/18/2019, Wt-85.3kg, CrCl-97.60ml/min. Will send in Rx.

## 2019-09-24 ENCOUNTER — Other Ambulatory Visit: Payer: Self-pay

## 2019-09-24 ENCOUNTER — Other Ambulatory Visit: Payer: Self-pay | Admitting: Internal Medicine

## 2019-09-24 ENCOUNTER — Ambulatory Visit: Payer: Medicare PPO | Admitting: Internal Medicine

## 2019-09-24 ENCOUNTER — Telehealth: Payer: Self-pay | Admitting: Internal Medicine

## 2019-09-24 ENCOUNTER — Encounter: Payer: Self-pay | Admitting: Internal Medicine

## 2019-09-24 DIAGNOSIS — I480 Paroxysmal atrial fibrillation: Secondary | ICD-10-CM | POA: Diagnosis not present

## 2019-09-24 MED ORDER — FLECAINIDE ACETATE 150 MG PO TABS: ORAL_TABLET | ORAL | 0 refills | Status: DC

## 2019-09-24 MED ORDER — RIVAROXABAN 20 MG PO TABS: ORAL_TABLET | ORAL | 0 refills | Status: DC

## 2019-09-24 NOTE — Progress Notes (Signed)
Patient Care Team: Aletha Halim., PA-C as PCP - General (Family Medicine)   HPI  Sheri Sims is a 69 y.o. female Seen in followup for palpitations and atrial fibrillation confirmed by her Alive[employer] monitor. She also has blocked PACs resulting in functional bradycardia ( Saw AS-NP 10/19)  Started on Flecainide    She also has other symptomatic events which on monitoring ( and ECG today) shows quite variable PP intervals.   to  DATE TEST EF   1/19 Echo   60-65 % TR mod  1/19 MYOVIEW  70 % No Ischemia           Date Cr K Hgb  1/19 0.6 5.2 14.3         DATE PR interval QRSduration Dose Flecainide  4/19  130 66 0  11//19 144 72 50  3/21 176 90 100   She did great on the first few days on the flecainide.,  Feeling normal for the first days years.  She then had a terrible couple of days with shortness of breath and dizziness.  AliveCor monitor strips were reviewed (see below.). No dizziness following restoration of sinus rhythm  Records and Results Reviewed telemetry strips  Past Medical History:  Diagnosis Date  . Atrial flutter ([place])   . Costochondritis   . Enthesopathy of hip region   . Fatigue   . Fibromyalgia   . Helicobacter pylori infection   . Insomnia   . MVP (mitral valve prolapse)    OF ANTERIOR MITRAL VALVE LEAFLET  . Obesity   . Paroxysmal atrial fibrillation (HCC)   . Pharyngitis   . Pneumonia   . Sinusitis, chronic     Past Surgical History:  Procedure Laterality Date  . APPENDECTOMY    . CARDIAC CATHETERIZATION  2010   Normal  . STRESS ECHO TEST  05/31/2010   NO ISCHEMIA  . TONSILLECTOMY    . TRANSTHORACIC ECHOCARDIOGRAM  05/16/2010   EF 55-60%    Current Meds  Medication Sig  . acetaminophen (TYLENOL 8 HOUR ARTHRITIS PAIN) 650 MG CR tablet Take 650 mg by mouth every 8 (eight) hours as needed for pain.   . Calcium Carbonate (CALCIUM 600 PO) Take 1 tablet by mouth daily.  . Cetirizine HCl (ZYRTEC ALLERGY) 10 MG CAPS Take 10  mg by mouth daily as needed (allergies).   . cholecalciferol (VITAMIN D) 1000 UNITS tablet Take 1,000 Units by mouth daily.    . flecainide (TAMBOCOR) 100 MG tablet Take 1 tablet (100 mg total) by mouth 2 (two) times daily.  Marland Kitchen HYDROcodone-homatropine (HYDROMET) 5-1.5 MG/5ML syrup Take 5 mLs by mouth every 6 (six) hours as needed for cough.  . loratadine (CLARITIN) 10 MG tablet Take 10 mg by mouth daily as needed for allergies.  . metoprolol succinate (TOPROL-XL) 25 MG 24 hr tablet Take 1 tablet (25 mg total) by mouth daily.  . multivitamin (THERAGRAN) per tablet Take 1 tablet by mouth every other day.   . propranolol (INDERAL) 10 MG tablet Take 1 tablet (10 mg total) by mouth as needed. Take 1 tablet (10mg ) by mouth every hour x2 for heart palpitations.  . rivaroxaban (XARELTO) 20 MG TABS tablet TAKE 1 TABLET(20 MG) BY MOUTH DAILY    Allergies  Allergen Reactions  . Codeine Nausea And Vomiting and Nausea Only  . Sulfur Dioxide Nausea Only  . Sulfites Nausea And Vomiting  . Codeine Phosphate Nausea And Vomiting  . Other  Prednisone cream caused blisters   . Sulfa Antibiotics Nausea And Vomiting      Review of Systems negative except from HPI and PMH  Physical Exam BP 134/74   Pulse (!) 59   Ht 5' 0.5" (1.537 m)   Wt 189 lb (85.7 kg)   SpO2 96%   BMI 36.30 kg/m  Well developed and nourished in no acute distress HENT normal Neck supple with JVP-  Flat  Clear Regular rate and rhythm, no murmurs or gallops Abd-soft with active BS No Clubbing cyanosis edema Skin-warm and dry A & Oriented  Grossly normal sensory and motor function  ECG  Sinus @ 59 18/09/42  Assessment and  Plan  Atrial fibrillation/flutter-paroxysmal  Palpitations associated with PACs and variable PP intervals  Hyperkalemia  QRS widening (relative) on flecainide   The patient is having scant palpitations.  She is very pleased with her course on flecainide.  However, her QRS/PR intervals have  widened on the 100 mg dose compared to baseline.  Hence, we will reduce her to 75 twice daily.  She will need another ECG in about 3 weeks.  We will check a metabolic profile given her relatively high potassium.  Per the issue raised and Dr. Nikki Dom note regarding anticoagulation with a CHA2DS2-VASc score of 2, we have discussed this.  It is my interpretation of the data that there is variable import of the different CHADS-VASc points and that age is the most potent single-point predictor.  With shared decision making, we have elected to continue her on anticoagulation.  She will look into the issue of Eliquis from a cost perspective given comparative data suggesting that it may be more effective and safer   Current medicines are reviewed at length with the patient today .  The patient does not have concerns regarding medicines.

## 2019-09-24 NOTE — Addendum Note (Signed)
Addended by: Derrel Nip B on: 09/24/2019 10:50 AM   Modules accepted: Orders

## 2019-09-24 NOTE — Patient Instructions (Signed)
Medication Instructions: Your physician has recommended you make the following change in your medication:   **Begin Flecainide 150mg .  Take 1/2 tablet by mouth twice daily  *If you need a refill on your cardiac medications before your next appointment, please call your pharmacy*   Lab Work: You will have labs drawn today: BMET and CBC  If you have labs (blood work) drawn today and your tests are completely normal, you will receive your results only by: Marland Kitchen MyChart Message (if you have MyChart) OR . A paper copy in the mail If you have any lab test that is abnormal or we need to change your treatment, we will call you to review the results.   Testing/Procedures: None ordered.    Follow-Up: At [facility], you and your health needs are our priority.  As part of our continuing mission to provide you with exceptional heart care, we have created designated Provider Care Teams.  These Care Teams include your primary Cardiologist (physician) and Advanced Practice Providers (APPs -  Physician Assistants and Nurse Practitioners) who all work together to provide you with the care you need, when you need it.  We recommend signing up for the patient portal called "MyChart".  Sign up information is provided on this After Visit Summary.  MyChart is used to connect with patients for Virtual Visits (Telemedicine).  Patients are able to view lab/test results, encounter notes, upcoming appointments, etc.  Non-urgent messages can be sent to your provider as well.   To learn more about what you can do with MyChart, go to [email or web address].    Your next appointment:  10/15/2019 at 11am for EKG

## 2019-09-24 NOTE — Telephone Encounter (Signed)
Patient called to say she wants to switch to Eliquis.  She checked with her insurance and it's ok.

## 2019-09-25 LAB — BASIC METABOLIC PANEL
BUN/Creatinine Ratio: 20 (ref 12–28)
BUN: 14 mg/dL (ref 8–27)
CO2: 27 mmol/L (ref 20–29)
Calcium: 10 mg/dL (ref 8.7–10.3)
Chloride: 105 mmol/L (ref 96–106)
Creatinine, Ser: 0.69 mg/dL (ref 0.57–1.00)
GFR calc Af Amer: 101 mL/min/{1.73_m2} (ref >59)
GFR calc non Af Amer: 88 mL/min/{1.73_m2} (ref >59)
Glucose: 86 mg/dL (ref 65–99)
Potassium: 5.4 mmol/L — ABNORMAL HIGH (ref 3.5–5.2)
Sodium: 145 mmol/L — ABNORMAL HIGH (ref 134–144)

## 2019-09-25 LAB — CBC
Hematocrit: 46.3 % (ref 34.0–46.6)
Hemoglobin: 15.2 g/dL (ref 11.1–15.9)
MCH: 32.1 pg (ref 26.6–33.0)
MCHC: 32.8 g/dL (ref 31.5–35.7)
MCV: 98 fL — ABNORMAL HIGH (ref 79–97)
Platelets: 242 10*3/uL (ref 150–450)
RBC: 4.73 x10E6/uL (ref 3.77–5.28)
RDW: 11.9 % (ref 11.7–15.4)
WBC: 5.3 10*3/uL (ref 3.4–10.8)

## 2019-09-26 ENCOUNTER — Other Ambulatory Visit: Payer: Medicare PPO | Admitting: *Deleted

## 2019-09-26 ENCOUNTER — Telehealth: Payer: Self-pay

## 2019-09-26 ENCOUNTER — Other Ambulatory Visit: Payer: Self-pay

## 2019-09-26 DIAGNOSIS — I480 Paroxysmal atrial fibrillation: Secondary | ICD-10-CM

## 2019-09-26 NOTE — Telephone Encounter (Signed)
-----   Message from Deboraha Sprang, MD sent at 09/25/2019  9:29 PM EDT ----- Please Inform Patient  Labs are normal x elevated Potassium Can we redraw it   I dont see a cause for its elevation   Thanks

## 2019-09-26 NOTE — Telephone Encounter (Signed)
Attempted to call patient. LMTCB 09/26/2019   

## 2019-09-26 NOTE — Telephone Encounter (Signed)
Call to patient to review labs.    Pt verbalized understanding.    Advised pt to call for any further questions or concerns.  Order placed for repeat BMET.

## 2019-09-27 LAB — BASIC METABOLIC PANEL
BUN/Creatinine Ratio: 24 (ref 12–28)
BUN: 16 mg/dL (ref 8–27)
CO2: 27 mmol/L (ref 20–29)
Calcium: 9.4 mg/dL (ref 8.7–10.3)
Chloride: 105 mmol/L (ref 96–106)
Creatinine, Ser: 0.66 mg/dL (ref 0.57–1.00)
GFR calc Af Amer: 103 mL/min/{1.73_m2} (ref >59)
GFR calc non Af Amer: 89 mL/min/{1.73_m2} (ref >59)
Glucose: 72 mg/dL (ref 65–99)
Potassium: 4.6 mmol/L (ref 3.5–5.2)
Sodium: 143 mmol/L (ref 134–144)

## 2019-09-29 NOTE — Telephone Encounter (Signed)
Great  5 bid

## 2019-09-30 ENCOUNTER — Telehealth: Payer: Self-pay | Admitting: Internal Medicine

## 2019-09-30 MED ORDER — APIXABAN 5 MG PO TABS: 5.0000 mg | ORAL_TABLET | Freq: Two times a day (BID) | ORAL | 3 refills | Status: DC

## 2019-09-30 NOTE — Telephone Encounter (Signed)
Attempted phone call to pt to inform Eliquis has been prescribed and sent to pharmacy.  Left voicemail message to contact RN at [phone].

## 2019-09-30 NOTE — Telephone Encounter (Signed)
Spoke with pt and advised Eliquis 5mg  1 tablet by mouth twice daily has been sent to pharmcy on file.  Pt verbalizes understanding and has no questions at this time.

## 2019-09-30 NOTE — Telephone Encounter (Signed)
Patient is returning Sheri Sims's call. Please advise.

## 2019-09-30 NOTE — Telephone Encounter (Signed)
Spoke with pt and advised Eliquis 5 mg, 1 tablet by mouth twice daily has been sent to pharmacy on file.  Pt verbalizes understanding and has no questions at this time.

## 2019-10-10 ENCOUNTER — Other Ambulatory Visit: Payer: Self-pay

## 2019-10-10 ENCOUNTER — Emergency Department (HOSPITAL_BASED_OUTPATIENT_CLINIC_OR_DEPARTMENT_OTHER): Payer: Medicare PPO

## 2019-10-10 ENCOUNTER — Encounter (HOSPITAL_BASED_OUTPATIENT_CLINIC_OR_DEPARTMENT_OTHER): Payer: Self-pay

## 2019-10-10 ENCOUNTER — Emergency Department (HOSPITAL_BASED_OUTPATIENT_CLINIC_OR_DEPARTMENT_OTHER)
Admission: EM | Admit: 2019-10-10 | Discharge: 2019-10-10 | Disposition: A | Discharge status: Home / Self Care | Payer: Medicare PPO | Attending: Emergency Medicine | Admitting: Emergency Medicine

## 2019-10-10 DIAGNOSIS — Z7901 Long term (current) use of anticoagulants: Secondary | ICD-10-CM | POA: Diagnosis not present

## 2019-10-10 DIAGNOSIS — R000 Tachycardia, unspecified: Secondary | ICD-10-CM | POA: Diagnosis present

## 2019-10-10 DIAGNOSIS — Z79899 Other long term (current) drug therapy: Secondary | ICD-10-CM | POA: Diagnosis not present

## 2019-10-10 DIAGNOSIS — I480 Paroxysmal atrial fibrillation: Secondary | ICD-10-CM | POA: Diagnosis not present

## 2019-10-10 DIAGNOSIS — M797 Fibromyalgia: Secondary | ICD-10-CM | POA: Diagnosis not present

## 2019-10-10 DIAGNOSIS — I4892 Unspecified atrial flutter: Secondary | ICD-10-CM

## 2019-10-10 LAB — BASIC METABOLIC PANEL
Anion gap: 11 (ref 5–15)
BUN: 12 mg/dL (ref 8–23)
CO2: 22 mmol/L (ref 22–32)
Calcium: 9.4 mg/dL (ref 8.9–10.3)
Chloride: 108 mmol/L (ref 98–111)
Creatinine, Ser: 0.62 mg/dL (ref 0.44–1.00)
GFR calc Af Amer: >60 mL/min (ref >60)
GFR calc non Af Amer: >60 mL/min (ref >60)
Glucose, Bld: 89 mg/dL (ref 70–99)
Potassium: 4.5 mmol/L (ref 3.5–5.1)
Sodium: 141 mmol/L (ref 135–145)

## 2019-10-10 LAB — CBC
HCT: 49.5 % — ABNORMAL HIGH (ref 36.0–46.0)
Hemoglobin: 15.7 g/dL — ABNORMAL HIGH (ref 12.0–15.0)
MCH: 32.9 pg (ref 26.0–34.0)
MCHC: 31.7 g/dL (ref 30.0–36.0)
MCV: 103.8 fL — ABNORMAL HIGH (ref 80.0–100.0)
Platelets: 216 10*3/uL (ref 150–400)
RBC: 4.77 MIL/uL (ref 3.87–5.11)
RDW: 12.7 % (ref 11.5–15.5)
WBC: 5.4 10*3/uL (ref 4.0–10.5)
nRBC: 0 % (ref 0.0–0.2)

## 2019-10-10 LAB — MAGNESIUM: Magnesium: 2.1 mg/dL (ref 1.7–2.4)

## 2019-10-10 LAB — PROTIME-INR
INR: 1.2 (ref 0.8–1.2)
Prothrombin Time: 14.7 seconds (ref 11.4–15.2)

## 2019-10-10 LAB — TROPONIN I (HIGH SENSITIVITY): Troponin I (High Sensitivity): 2 ng/L (ref <18)

## 2019-10-10 MED ORDER — SODIUM CHLORIDE 0.9 % IV BOLUS
500.0000 mL | Freq: Once | INTRAVENOUS | Status: AC
  Administered 2019-10-10: 15:00:00 500 mL via INTRAVENOUS

## 2019-10-10 MED ORDER — METOPROLOL TARTRATE 5 MG/5ML IV SOLN
5.0000 mg | Freq: Once | INTRAVENOUS | Status: AC
  Administered 2019-10-10: 5 mg via INTRAVENOUS
  Filled 2019-10-10: qty 5

## 2019-10-10 MED ORDER — DILTIAZEM HCL 25 MG/5ML IV SOLN
20.0000 mg | Freq: Once | INTRAVENOUS | Status: AC
  Administered 2019-10-10: 20 mg via INTRAVENOUS
  Filled 2019-10-10: qty 5

## 2019-10-10 MED ORDER — SODIUM CHLORIDE 0.9 % IV BOLUS
500.0000 mL | Freq: Once | INTRAVENOUS | Status: AC
  Administered 2019-10-10: 500 mL via INTRAVENOUS

## 2019-10-10 MED ORDER — FLECAINIDE ACETATE 100 MG PO TABS: 100.0000 mg | ORAL_TABLET | Freq: Two times a day (BID) | ORAL | 0 refills | Status: DC

## 2019-10-10 NOTE — ED Notes (Signed)
Assisted to bedside commode

## 2019-10-10 NOTE — Discharge Instructions (Signed)
You were seen in the emergency department today with elevated heart rate and atrial flutter.  After consultation with your cardiologist we are increasing your flecainide and have called this prescription into your pharmacy.  Please keep your appointment with them next week and return to the emergency department any new or suddenly worsening symptoms.

## 2019-10-10 NOTE — ED Notes (Signed)
Ambulated pt in hallway, with all monitors working, maintained 94-97% SpO2, hr ranged from 63 to 81 at the highest point.  Pt. Stated she felt good walking, but admitted that she would be tired when she got done and back in the room,  AFIB on monitor noted, sent another EKG as well, if needed.

## 2019-10-10 NOTE — ED Provider Notes (Signed)
[facility] EMERGENCY DEPARTMENT Provider Note   CSN: [id number] Arrival date & time: 10/10/19  1219     History Chief Complaint  Patient presents with  . Tachycardia    Sheri Sims is a 69 y.o. female.  Pt presents to the ED today with rapid HR.  Pt has a hx of afib/aflutter since she was 57.  She is followed by Dr. Caryl Comes.  Pt said she saw him at the end of March and her Xarelto was changed to Eliquis and her Flecainide was reduced because her QRS was widening.  Pt said she was taking 200 mg bid prior to that visit on 3/24, but Dr. Olin Pia notes said she was taking 100 bid.  Her dose was reduced to 75 mg bid.  So she went from taking 400 mg per day to 150 per day.  Pt said she had 1 episode of tachycardia about a week ago, but it went away with rest.  Today, she woke up and her HR was over 100 (normally in the 60s and 70s).  She normally takes Toprol XL at night, but took an extra dose this morning.  This did not help.  She rested and it still did not get better.  She then took her nl flecainide dose which also did not help.  HR still above 100, so she came in.  No sob or cp.        Past Medical History:  Diagnosis Date  . Atrial flutter ([place])   . Costochondritis   . Enthesopathy of hip region   . Fatigue   . Fibromyalgia   . Helicobacter pylori infection   . Insomnia   . MVP (mitral valve prolapse)    OF ANTERIOR MITRAL VALVE LEAFLET  . Obesity   . Paroxysmal atrial fibrillation ([facility])   . Pharyngitis   . Pneumonia   . Sinusitis, chronic     Patient Active Problem List   Diagnosis Date Noted  . A-fib ([place]) 09/22/2019  . URI (upper respiratory infection) 08/20/2018  . Pain in right knee 01/09/2018  . Wheezing 11/05/2017  . Mitral valve prolapse 08/02/2015  . Arm pain 07/21/2015  . Foot pain 07/21/2015  . Awareness of heartbeats 07/21/2015  . Osteopenia 07/21/2015  . Symptoms involving urinary system 07/21/2015  . Insomnia, persistent 07/21/2015  .  Abnormal chest x-ray 02/02/2015  . Arthritis 02/02/2015  . Calcaneal spur 02/02/2015  . Cardiac conduction disorder 02/02/2015  . Acid reflux 02/02/2015  . Fatigue 02/02/2015  . Heel pain 02/02/2015  . HLD (hyperlipidemia) 02/02/2015  . Idiopathic angio-edema-urticaria 02/02/2015  . Elevated fasting blood sugar 02/02/2015  . Encounter for general adult medical examination without abnormal findings 02/02/2015  . Disorder of hematopoietic structure 02/02/2015  . Back ache 02/02/2015  . Pleurisy 02/02/2015  . Posterior vitreous detachment 02/02/2015  . Temporomandibular joint disorder 02/02/2015  . Bilateral arm pain 02/02/2015  . Pulmonary infiltrates 02/13/2014  . Fibromyalgia 11/03/2013  . Heart palpitations 11/03/2013  . Acute respiratory failure with hypoxia ([place]) 11/03/2013  . Chest pain, pleuritic 11/03/2013  . Pneumonia 11/03/2013  . Myalgia and myositis 07/11/2013  . Osteoarthritis, hand 07/11/2013  . Anxiety 03/28/2013  . Positive antinuclear antibody 03/28/2013    Past Surgical History:  Procedure Laterality Date  . APPENDECTOMY    . CARDIAC CATHETERIZATION  2010   Normal  . STRESS ECHO TEST  05/31/2010   NO ISCHEMIA  . TONSILLECTOMY    . TRANSTHORACIC ECHOCARDIOGRAM  05/16/2010  EF 55-60%     OB History   No obstetric history on file.     Family History  Problem Relation Age of Onset  . Breast cancer Mother   . Breast cancer Sister     Social History   Tobacco Use  . Smoking status: Never Smoker  . Smokeless tobacco: Never Used  Substance Use Topics  . Alcohol use: No  . Drug use: No    Home Medications Prior to Admission medications   Medication Sig Start Date End Date Taking? Authorizing Provider  acetaminophen (TYLENOL 8 HOUR ARTHRITIS PAIN) 650 MG CR tablet Take 650 mg by mouth every 8 (eight) hours as needed for pain.  05/04/15   [provider]  apixaban (ELIQUIS) 5 MG TABS tablet Take 1 tablet (5 mg total) by mouth 2 (two)  times daily. 09/30/19   Deboraha Sprang, MD  Calcium Carbonate (CALCIUM 600 PO) Take 1 tablet by mouth daily. 06/03/19   [provider]  Cetirizine HCl (ZYRTEC ALLERGY) 10 MG CAPS Take 10 mg by mouth daily as needed (allergies).  05/04/15   [provider]  cholecalciferol (VITAMIN D) 1000 UNITS tablet Take 1,000 Units by mouth daily.      [provider]  flecainide (TAMBOCOR) 100 MG tablet Take 1 tablet (100 mg total) by mouth 2 (two) times daily. 10/10/19   Isla Pence, MD  HYDROcodone-homatropine (HYDROMET) 5-1.5 MG/5ML syrup Take 5 mLs by mouth every 6 (six) hours as needed for cough. 08/19/18   Tanda Rockers, MD  loratadine (CLARITIN) 10 MG tablet Take 10 mg by mouth daily as needed for allergies.    [provider]  metoprolol succinate (TOPROL-XL) 25 MG 24 hr tablet Take 1 tablet (25 mg total) by mouth daily. 05/19/11   Burtis Junes, NP  multivitamin [facility]) per tablet Take 1 tablet by mouth every other day.     [provider]  propranolol (INDERAL) 10 MG tablet Take 1 tablet (10 mg total) by mouth as needed. Take 1 tablet (10mg ) by mouth every hour x2 for heart palpitations. 02/28/19   Baldwin Jamaica, PA-C    Allergies    Codeine, Sulfur dioxide, Sulfites, Codeine phosphate, Other, and Sulfa antibiotics  Review of Systems   Review of Systems  Cardiovascular: Positive for palpitations.  All other systems reviewed and are negative.   Physical Exam Updated Vital Signs BP 92/68   Pulse (!) 31   Temp 98.3 F (36.8 C) (Oral)   Resp 20   Ht 5\' 5"  (1.651 m)   Wt 82.6 kg   SpO2 94% Comment: rm air  BMI 30.29 kg/m   Physical Exam Vitals and nursing note reviewed.  Constitutional:      Appearance: Normal appearance.  HENT:     Head: Normocephalic and atraumatic.     Right Ear: External ear normal.     Left Ear: External ear normal.     Nose: Nose normal.     Mouth/Throat:     Mouth: Mucous membranes are moist.      Pharynx: Oropharynx is clear.  Eyes:     Extraocular Movements: Extraocular movements intact.     Conjunctiva/sclera: Conjunctivae normal.     Pupils: Pupils are equal, round, and reactive to light.  Cardiovascular:     Rate and Rhythm: Tachycardia present. Rhythm irregular.     Pulses: Normal pulses.     Heart sounds: Normal heart sounds.  Pulmonary:     Effort: Pulmonary  effort is normal.     Breath sounds: Normal breath sounds.  Abdominal:     General: Abdomen is flat. Bowel sounds are normal.     Palpations: Abdomen is soft.  Musculoskeletal:        General: Normal range of motion.     Cervical back: Normal range of motion and neck supple.  Skin:    General: Skin is warm.     Capillary Refill: Capillary refill takes less than 2 seconds.  Neurological:     General: No focal deficit present.     Mental Status: She is alert and oriented to person, place, and time.  Psychiatric:        Mood and Affect: Mood normal.        Behavior: Behavior normal.        Thought Content: Thought content normal.        Judgment: Judgment normal.     ED Results / Procedures / Treatments   Labs (all labs ordered are listed, but only abnormal results are displayed) Labs Reviewed  CBC - Abnormal; Notable for the following components:      Result Value   Hemoglobin 15.7 (*)    HCT 49.5 (*)    MCV 103.8 (*)    All other components within normal limits  BASIC METABOLIC PANEL  MAGNESIUM  PROTIME-INR  TROPONIN I (HIGH SENSITIVITY)  TROPONIN I (HIGH SENSITIVITY)    EKG EKG Interpretation  Date/Time:  Friday October 10 2019 15:18:00 EDT Ventricular Rate:  62 PR Interval:    QRS Duration: 83 QT Interval:  411 QTC Calculation: 418 R Axis:   50 Text Interpretation: Sinus rhythm Atrial premature complex Low voltage, precordial leads Pt has converted to sinus rhythm Confirmed by Isla Pence [phone]) on 10/10/2019 3:21:44 PM   Radiology DG Chest 2 View  Result Date: 10/10/2019 CLINICAL  DATA:  Atrial flutter EXAM: CHEST - 2 VIEW COMPARISON:  May 01, 2018 FINDINGS: There is scarring in the right lower lung region. Lungs elsewhere are clear. Heart size and pulmonary vascularity are normal. No adenopathy. No bone lesions. IMPRESSION: Scarring right lower lung region. Lungs elsewhere clear. Stable cardiac silhouette. Electronically Signed   By: Lowella Grip III M.D.   On: 10/10/2019 13:15    Procedures Procedures (including critical care time)  Medications Ordered in ED Medications  metoprolol tartrate (LOPRESSOR) injection 5 mg (5 mg Intravenous Given 10/10/19 1337)  diltiazem (CARDIZEM) injection 20 mg (20 mg Intravenous Given 10/10/19 1423)  sodium chloride 0.9 % bolus 500 mL (500 mLs Intravenous New Bag/Given 10/10/19 1442)  sodium chloride 0.9 % bolus 500 mL (500 mLs Intravenous New Bag/Given 10/10/19 1525)    ED Course  I have reviewed the triage vital signs and the nursing notes.  Pertinent labs & imaging results that were available during my care of the patient were reviewed by me and considered in my medical decision making (see chart for details).    MDM Rules/Calculators/A&P                     5 mg of lopressor IV did nothing to hr.  Pt d/w Dr. Margaretann Loveless who recommended 20 mg cardizem IV.  This did help HR.  BP dropped to 91, so she was given 500 cc NS bolus.  While here, pt did convert to sinus rhythm.  HR was dropping to the 30s and 40s.  I am going to sign her out to Dr. Laverta Baltimore to watch for a  little while.  If it comes back up, she can go.  I am going to have her increase her flecainide to 100 mg bid.  Pt has an appt with Dr. Caryl Comes on 4/14.    Final Clinical Impression(s) / ED Diagnoses Final diagnoses:  Atrial flutter with rapid ventricular response ([place])    Rx / DC Orders ED Discharge Orders         Ordered    flecainide (TAMBOCOR) 100 MG tablet  2 times daily    Note to Pharmacy: **Patient requests 90 days supply**   10/10/19 1518             Isla Pence, MD 10/10/19 1526

## 2019-10-10 NOTE — ED Notes (Signed)
Pt to xr 

## 2019-10-10 NOTE — ED Triage Notes (Signed)
Pt c/o elevated HR, weakness, SOB, "chest discomfort" started this am-reports hx of afib-NAD-steady gait

## 2019-10-10 NOTE — ED Provider Notes (Signed)
Blood pressure 92/68, pulse (!) 31, temperature 98.3 F (36.8 C), temperature source Oral, resp. rate 20, height 5\' 5"  (1.651 m), weight 82.6 kg, SpO2 94 %.  Assuming care from Dr. Gilford Raid.  In short, Sheri Sims is a 69 y.o. female with a chief complaint of Tachycardia .  Refer to the original H&P for additional details.  The current plan of care is to f/u after obs period. Patient with sinus bradycardia after conversion from a-flutter with RVR.   EKG Interpretation  Date/Time:  Friday October 10 2019 15:18:00 EDT Ventricular Rate:  62 PR Interval:    QRS Duration: 83 QT Interval:  411 QTC Calculation: 418 R Axis:   50 Text Interpretation: Sinus rhythm Atrial premature complex Low voltage, precordial leads Pt has converted to sinus rhythm Confirmed by Isla Pence [phone]) on 10/10/2019 3:21:44 PM       4:29 PM Reassessed patient and she is feeling well.  She is ambulated without difficulty.  Her heart rate is now in the 60s to 70s.  Discussed the change in her flecainide dose.  She has cardiology follow-up next week.  Answered questions and discussed ED return precautions.  Patient is comfortable with the plan at discharge.   Margette Fast, MD 10/10/19 [phone]

## 2019-10-14 ENCOUNTER — Telehealth: Payer: Self-pay | Admitting: Internal Medicine

## 2019-10-14 NOTE — Telephone Encounter (Signed)
New Message  Pt was calling to receive her results  Please call back

## 2019-10-15 ENCOUNTER — Other Ambulatory Visit: Payer: Self-pay

## 2019-10-15 ENCOUNTER — Ambulatory Visit (INDEPENDENT_AMBULATORY_CARE_PROVIDER_SITE_OTHER): Payer: Medicare PPO | Admitting: *Deleted

## 2019-10-15 VITALS — BP 126/64 | Ht 65.5 in | Wt 186.0 lb

## 2019-10-15 DIAGNOSIS — Z79899 Other long term (current) drug therapy: Secondary | ICD-10-CM | POA: Diagnosis not present

## 2019-10-15 DIAGNOSIS — I480 Paroxysmal atrial fibrillation: Secondary | ICD-10-CM | POA: Diagnosis not present

## 2019-10-15 NOTE — Progress Notes (Signed)
1.  Reason for visit: EKG post flecainide increase on 4/9. Pt was to have EKG follow up after increase.  2.  Name of MD requesting visit:  Caryl Comes  3. H&P:  See epic  4.  ROS related to problem:  See epic  5.  Assessment and plan per MD:   EKG performed, showing sinus bradycardia, HR 54.  PR 168ms, QRS 65ms, QT/QTc 434/477ms  No orders received.  Pt to continue current Flecainide dosing. Send to Dr. Caryl Comes for review/advisement.  [facility] nurse to call with any recommendation.

## 2019-10-16 NOTE — Telephone Encounter (Signed)
Spoke with pt who states she went to ED last week and was found to be in A-Flutter.  Pt states her Flecainide dosage was increased back to 100mg  bid.  Pt asking what her is her next step?  Pt had Nurse visit and EKG 10/15/2019.  Pt advised will discuss next steps with Dr Caryl Comes.  Pt verbalizes understanding and agrees with current plan.

## 2019-10-17 MED ORDER — FLECAINIDE ACETATE 150 MG PO TABS: 75.0000 mg | ORAL_TABLET | Freq: Two times a day (BID) | ORAL | 3 refills | Status: DC

## 2019-10-17 NOTE — Telephone Encounter (Signed)
Flecainide 150mg  #90 with 3RF sent to pharmacy.  Pt to take 1/2 tablet twice daily.  Spoke with pt and advised  New Flecainide Rx sent to pharmacy.  Pt states she is back in AFIB today and does not want to go back on the lower dose of Flecainide.  Appointment scheduled with Dr Rayann Heman for 10/22/2019 at 1145am to discuss ablation.  Pt advised if she develops CP, SOB, dizziness will need to go to ED for further evaluation.  Pt verbalizes understanding and agrees with current plan.

## 2019-10-17 NOTE — Addendum Note (Signed)
Addended by: Thora Lance on: 10/17/2019 05:15 PM   Modules accepted: Orders

## 2019-10-17 NOTE — Telephone Encounter (Signed)
Called spoke to patient. Her alivecor monitor recording 11/19 is most consistent with Afib, although the subsequent tracings and the others sent 3/19 showed sinus w PACs  ECG from 4/21 >>aflutter with 2:1 conduction  Notes refer also to atrial flutter  She did not tolerate Flec 100 bid 2/2 excessive PR/QRS prolongation so will  1) decrease flecainide back to 75  Bid 2) next options include  A)  Doing nothing since the last arrhythmia of note was 18 mnths ago  B) consider ablation again -- and then two approaches   Since Atrial flutter has been the more problematic we could continue the flec and ablate her atrial flutter ( I can do that) or we can have a discussion wth Dr Greggory Brandy again about ablation of both the atrial flutter and her afib Thanks SK

## 2019-10-21 ENCOUNTER — Telehealth: Payer: Self-pay

## 2019-10-22 ENCOUNTER — Telehealth: Payer: Self-pay

## 2019-10-22 ENCOUNTER — Telehealth (INDEPENDENT_AMBULATORY_CARE_PROVIDER_SITE_OTHER): Payer: Medicare PPO | Admitting: Internal Medicine

## 2019-10-22 ENCOUNTER — Encounter: Payer: Self-pay | Admitting: Internal Medicine

## 2019-10-22 ENCOUNTER — Other Ambulatory Visit: Payer: Self-pay

## 2019-10-22 VITALS — BP 124/76 | HR 58 | Ht 65.5 in | Wt 185.0 lb

## 2019-10-22 DIAGNOSIS — I491 Atrial premature depolarization: Secondary | ICD-10-CM

## 2019-10-22 DIAGNOSIS — I341 Nonrheumatic mitral (valve) prolapse: Secondary | ICD-10-CM

## 2019-10-22 DIAGNOSIS — I4892 Unspecified atrial flutter: Secondary | ICD-10-CM

## 2019-10-22 DIAGNOSIS — I480 Paroxysmal atrial fibrillation: Secondary | ICD-10-CM

## 2019-10-22 DIAGNOSIS — I483 Typical atrial flutter: Secondary | ICD-10-CM

## 2019-10-22 DIAGNOSIS — I071 Rheumatic tricuspid insufficiency: Secondary | ICD-10-CM | POA: Diagnosis not present

## 2019-10-22 DIAGNOSIS — I4891 Unspecified atrial fibrillation: Secondary | ICD-10-CM

## 2019-10-22 DIAGNOSIS — D6869 Other thrombophilia: Secondary | ICD-10-CM

## 2019-10-22 NOTE — Telephone Encounter (Signed)
-----   Message from Thompson Grayer, MD sent at 10/22/2019 12:01 PM EDT ----- Afib ablation  C/I/A  Cardiac CT  Also needs a 2D echo (last echo was 2019) to evaluate afib and tricuspid regurgation

## 2019-10-22 NOTE — H&P (View-Only) (Signed)
Electrophysiology TeleHealth Note   Due to national recommendations of social distancing due to COVID 19, an audio/video telehealth visit is felt to be most appropriate for this patient at this time.  See MyChart message from today for the patient's consent to telehealth for [facility].  Date:  10/22/2019   ID:  Sheri Sims, DOB January 18, 1951, MRN [id number]  Location: patient's home  Provider location:  [place]  Evaluation Performed: Follow-up visit  PCP:  Aletha Halim., PA-C   Electrophysiologist:  Dr Rayann Heman  Chief Complaint:  palpitations  History of Present Illness:    Sheri Sims is a 69 y.o. female who presents via telehealth conferencing today.  Since last being seen in our clinic, the patient reports doing reasonably well.   She is referred back by Dr Caryl Comes for consideration of afib ablation.  She is on flecainide.  She reports increasing frequency and duration of afib despite this medicine.  She has palpitations and fatigue with her afib. Today, she denies symptoms of palpitations, chest pain, shortness of breath,  lower extremity edema, dizziness, presyncope, or syncope.  The patient is otherwise without complaint today.   Past Medical History:  Diagnosis Date   Atrial flutter ([place])    Costochondritis    Enthesopathy of hip region    Fatigue    Fibromyalgia    Helicobacter pylori infection    Insomnia    MVP (mitral valve prolapse)    OF ANTERIOR MITRAL VALVE LEAFLET   Obesity    Paroxysmal atrial fibrillation ([facility])    Pharyngitis    Pneumonia    Sinusitis, chronic     Past Surgical History:  Procedure Laterality Date   APPENDECTOMY     CARDIAC CATHETERIZATION  2010   Normal   STRESS ECHO TEST  05/31/2010   NO ISCHEMIA   TONSILLECTOMY     TRANSTHORACIC ECHOCARDIOGRAM  05/16/2010   EF 55-60%    Current Outpatient Medications  Medication Sig Dispense Refill   acetaminophen (TYLENOL 8 HOUR ARTHRITIS PAIN) 650 MG  CR tablet Take 650 mg by mouth every 8 (eight) hours as needed for pain.      apixaban (ELIQUIS) 5 MG TABS tablet Take 1 tablet (5 mg total) by mouth 2 (two) times daily. 180 tablet 3   Calcium Carbonate (CALCIUM 600 PO) Take 1 tablet by mouth daily.     Cetirizine HCl (ZYRTEC ALLERGY) 10 MG CAPS Take 10 mg by mouth daily as needed (allergies).      cholecalciferol (VITAMIN D) 1000 UNITS tablet Take 1,000 Units by mouth daily.       flecainide (TAMBOCOR) 150 MG tablet Take 0.5 tablets (75 mg total) by mouth 2 (two) times daily. 90 tablet 3   HYDROcodone-homatropine (HYDROMET) 5-1.5 MG/5ML syrup Take 5 mLs by mouth every 6 (six) hours as needed for cough. 240 mL 0   loratadine (CLARITIN) 10 MG tablet Take 10 mg by mouth daily as needed for allergies.     metoprolol succinate (TOPROL-XL) 25 MG 24 hr tablet Take 1 tablet (25 mg total) by mouth daily. 30 tablet 2   multivitamin (THERAGRAN) per tablet Take 1 tablet by mouth every other day.      propranolol (INDERAL) 10 MG tablet Take 1 tablet (10 mg total) by mouth as needed. Take 1 tablet (10mg ) by mouth every hour x2 for heart palpitations. 30 tablet 6   No current facility-administered medications for this visit.    Allergies:   Codeine,  Sulfur dioxide, Sulfites, Codeine phosphate, Other, and Sulfa antibiotics   Social History:  The patient  reports that she has never smoked. She has never used smokeless tobacco. She reports that she does not drink alcohol or use drugs.   ROS:  Please see the history of present illness.   All other systems are personally reviewed and negative.    Exam:    Vital Signs:  BP 124/76    Pulse (!) 58    Ht 5' 5.5" (1.664 m)    Wt 185 lb (83.9 kg)    BMI 30.32 kg/m   Well sounding and appearing, alert and conversant, regular work of breathing,  good skin color Eyes- anicteric, neuro- grossly intact, skin- no apparent rash or lesions or cyanosis, mouth- oral mucosa is pink  Labs/Other Tests and Data  Reviewed:    Recent Labs: 10/10/2019: BUN 12; Creatinine, Ser 0.62; Hemoglobin 15.7; Magnesium 2.1; Platelets 216; Potassium 4.5; Sodium 141   Wt Readings from Last 3 Encounters:  10/22/19 185 lb (83.9 kg)  10/15/19 186 lb (84.4 kg)  10/10/19 182 lb (82.6 kg)    Dr Aquilla Hacker notes reviewed, ekgs reviewed, prior echo reviewed,  My note from 2019 was also reviewed  ASSESSMENT & PLAN:    1.  Paroxysmal atrial fibrillation/ atrial flutter/ PACs The patient has symptomatic, recurrent atrial fibrillation and atrial flutter. she has failed medical therapy with flecainide.  Sinus bradycardia limits medical options. Chads2vasc score is 2.  she is anticoagulated with eliquis . Therapeutic strategies for afib/ atrial flutter including medicine and ablation were discussed in detail with the patient today. Risk, benefits, and alternatives to EP study and radiofrequency ablation for afib were also discussed in detail today. These risks include but are not limited to stroke, bleeding, vascular damage, tamponade, perforation, damage to the esophagus, lungs, and other structures, pulmonary vein stenosis, worsening renal function, and death. The patient understands these risk and wishes to proceed.  We will therefore proceed with catheter ablation at the next available time.  Carto, ICE, anesthesia are requested for the procedure.  Will also obtain cardiac CT prior to the procedure to exclude LAA thrombus and further evaluate atrial anatomy.  2. Moderate TR Repeat echo (last echo 2019)   Patient Risk:  after full review of this patients clinical status, I feel that they are at moderate risk at this time.  Today, I have spent 20 minutes with the patient with telehealth technology discussing arrhythmia management .    Army Fossa, MD  10/22/2019 11:56 AM     [facility] [address] [place] [address] [phone] (office) [phone] (fax)

## 2019-10-22 NOTE — Telephone Encounter (Signed)
Outreach made to Pt.  Pt scheduled for afib ablation on 11/06/2019 at 7:30 am  Pt scheduled for ECHO on 11/03/19 at 7:30 am  Pt recently had labs-no additional lab work needed.  covid test scheduled.  Sending instruction letters via [employer].

## 2019-10-22 NOTE — Progress Notes (Signed)
Electrophysiology TeleHealth Note   Due to national recommendations of social distancing due to COVID 19, an audio/video telehealth visit is felt to be most appropriate for this patient at this time.  See MyChart message from today for the patient's consent to telehealth for [facility].  Date:  10/22/2019   ID:  Sheri Sims, DOB [birth year], MRN [id number]  Location: patient's home  Provider location:  [place]  Evaluation Performed: Follow-up visit  PCP:  Aletha Halim., PA-C   Electrophysiologist:  Dr Rayann Heman  Chief Complaint:  palpitations  History of Present Illness:    Sheri Sims is a 69 y.o. female who presents via telehealth conferencing today.  Since last being seen in our clinic, the patient reports doing reasonably well.   She is referred back by Dr Caryl Comes for consideration of afib ablation.  She is on flecainide.  She reports increasing frequency and duration of afib despite this medicine.  She has palpitations and fatigue with her afib. Today, she denies symptoms of palpitations, chest pain, shortness of breath,  lower extremity edema, dizziness, presyncope, or syncope.  The patient is otherwise without complaint today.   Past Medical History:  Diagnosis Date  . Atrial flutter ([place])   . Costochondritis   . Enthesopathy of hip region   . Fatigue   . Fibromyalgia   . Helicobacter pylori infection   . Insomnia   . MVP (mitral valve prolapse)    OF ANTERIOR MITRAL VALVE LEAFLET  . Obesity   . Paroxysmal atrial fibrillation ([facility])   . Pharyngitis   . Pneumonia   . Sinusitis, chronic     Past Surgical History:  Procedure Laterality Date  . APPENDECTOMY    . CARDIAC CATHETERIZATION  2010   Normal  . STRESS ECHO TEST  05/31/2010   NO ISCHEMIA  . TONSILLECTOMY    . TRANSTHORACIC ECHOCARDIOGRAM  05/16/2010   EF 55-60%    Current Outpatient Medications  Medication Sig Dispense Refill  . acetaminophen (TYLENOL 8 HOUR ARTHRITIS PAIN) 650 MG  CR tablet Take 650 mg by mouth every 8 (eight) hours as needed for pain.     Marland Kitchen apixaban (ELIQUIS) 5 MG TABS tablet Take 1 tablet (5 mg total) by mouth 2 (two) times daily. 180 tablet 3  . Calcium Carbonate (CALCIUM 600 PO) Take 1 tablet by mouth daily.    . Cetirizine HCl (ZYRTEC ALLERGY) 10 MG CAPS Take 10 mg by mouth daily as needed (allergies).     . cholecalciferol (VITAMIN D) 1000 UNITS tablet Take 1,000 Units by mouth daily.      . flecainide (TAMBOCOR) 150 MG tablet Take 0.5 tablets (75 mg total) by mouth 2 (two) times daily. 90 tablet 3  . HYDROcodone-homatropine (HYDROMET) 5-1.5 MG/5ML syrup Take 5 mLs by mouth every 6 (six) hours as needed for cough. 240 mL 0  . loratadine (CLARITIN) 10 MG tablet Take 10 mg by mouth daily as needed for allergies.    . metoprolol succinate (TOPROL-XL) 25 MG 24 hr tablet Take 1 tablet (25 mg total) by mouth daily. 30 tablet 2  . multivitamin (THERAGRAN) per tablet Take 1 tablet by mouth every other day.     . propranolol (INDERAL) 10 MG tablet Take 1 tablet (10 mg total) by mouth as needed. Take 1 tablet (10mg ) by mouth every hour x2 for heart palpitations. 30 tablet 6   No current facility-administered medications for this visit.    Allergies:   Codeine,  Sulfur dioxide, Sulfites, Codeine phosphate, Other, and Sulfa antibiotics   Social History:  The patient  reports that she has never smoked. She has never used smokeless tobacco. She reports that she does not drink alcohol or use drugs.   ROS:  Please see the history of present illness.   All other systems are personally reviewed and negative.    Exam:    Vital Signs:  BP 124/76   Pulse (!) 58   Ht 5' 5.5" (1.664 m)   Wt 185 lb (83.9 kg)   BMI 30.32 kg/m   Well sounding and appearing, alert and conversant, regular work of breathing,  good skin color Eyes- anicteric, neuro- grossly intact, skin- no apparent rash or lesions or cyanosis, mouth- oral mucosa is pink  Labs/Other Tests and Data  Reviewed:    Recent Labs: 10/10/2019: BUN 12; Creatinine, Ser 0.62; Hemoglobin 15.7; Magnesium 2.1; Platelets 216; Potassium 4.5; Sodium 141   Wt Readings from Last 3 Encounters:  10/22/19 185 lb (83.9 kg)  10/15/19 186 lb (84.4 kg)  10/10/19 182 lb (82.6 kg)    Dr Aquilla Hacker notes reviewed, ekgs reviewed, prior echo reviewed,  My note from 2019 was also reviewed  ASSESSMENT & PLAN:    1.  Paroxysmal atrial fibrillation/ atrial flutter/ PACs The patient has symptomatic, recurrent atrial fibrillation and atrial flutter. she has failed medical therapy with flecainide.  Sinus bradycardia limits medical options. Chads2vasc score is 2.  she is anticoagulated with eliquis . Therapeutic strategies for afib/ atrial flutter including medicine and ablation were discussed in detail with the patient today. Risk, benefits, and alternatives to EP study and radiofrequency ablation for afib were also discussed in detail today. These risks include but are not limited to stroke, bleeding, vascular damage, tamponade, perforation, damage to the esophagus, lungs, and other structures, pulmonary vein stenosis, worsening renal function, and death. The patient understands these risk and wishes to proceed.  We will therefore proceed with catheter ablation at the next available time.  Carto, ICE, anesthesia are requested for the procedure.  Will also obtain cardiac CT prior to the procedure to exclude LAA thrombus and further evaluate atrial anatomy.  2. Moderate TR Repeat echo (last echo 2019)   Patient Risk:  after full review of this patients clinical status, I feel that they are at moderate risk at this time.  Today, I have spent 20 minutes with the patient with telehealth technology discussing arrhythmia management .    Army Fossa, MD  10/22/2019 11:56 AM     [facility] [address] [place] [address] [phone] (office) [phone] (fax)

## 2019-10-31 ENCOUNTER — Telehealth (HOSPITAL_COMMUNITY): Payer: Self-pay | Admitting: Emergency Medicine

## 2019-10-31 NOTE — Telephone Encounter (Signed)
Reaching out to patient to offer assistance regarding upcoming cardiac imaging study; pt verbalizes understanding of appt date/time, parking situation and where to check in, pre-test NPO status and medications ordered, and verified current allergies; name and call back number provided for further questions should they arise Emillee Talsma RN Navigator Cardiac Imaging [facility] [phone] office [phone] cell 

## 2019-11-03 ENCOUNTER — Ambulatory Visit (HOSPITAL_COMMUNITY)
Admission: RE | Admit: 2019-11-03 | Discharge: 2019-11-03 | Disposition: A | Discharge status: Home / Self Care | Payer: Medicare PPO | Source: Ambulatory Visit | Attending: Internal Medicine | Admitting: Internal Medicine

## 2019-11-03 ENCOUNTER — Other Ambulatory Visit: Payer: Self-pay

## 2019-11-03 ENCOUNTER — Ambulatory Visit (HOSPITAL_BASED_OUTPATIENT_CLINIC_OR_DEPARTMENT_OTHER): Payer: Medicare PPO

## 2019-11-03 DIAGNOSIS — I480 Paroxysmal atrial fibrillation: Secondary | ICD-10-CM

## 2019-11-03 DIAGNOSIS — I4891 Unspecified atrial fibrillation: Secondary | ICD-10-CM | POA: Insufficient documentation

## 2019-11-03 DIAGNOSIS — I341 Nonrheumatic mitral (valve) prolapse: Secondary | ICD-10-CM | POA: Diagnosis not present

## 2019-11-03 MED ORDER — IOHEXOL 350 MG/ML SOLN
80.0000 mL | Freq: Once | INTRAVENOUS | Status: AC | PRN
  Administered 2019-11-03: 14:00:00 80 mL via INTRAVENOUS

## 2019-11-04 ENCOUNTER — Other Ambulatory Visit (HOSPITAL_COMMUNITY)
Admission: RE | Admit: 2019-11-04 | Discharge: 2019-11-04 | Disposition: A | Discharge status: Home / Self Care | Payer: Medicare PPO | Source: Ambulatory Visit | Attending: Internal Medicine | Admitting: Internal Medicine

## 2019-11-04 DIAGNOSIS — Z20822 Contact with and (suspected) exposure to covid-19: Secondary | ICD-10-CM | POA: Diagnosis not present

## 2019-11-04 DIAGNOSIS — Z01812 Encounter for preprocedural laboratory examination: Secondary | ICD-10-CM | POA: Diagnosis present

## 2019-11-04 LAB — SARS CORONAVIRUS 2 (TAT 6-24 HRS): SARS Coronavirus 2: NEGATIVE

## 2019-11-05 ENCOUNTER — Telehealth: Payer: Self-pay | Admitting: Internal Medicine

## 2019-11-05 ENCOUNTER — Encounter (HOSPITAL_COMMUNITY): Payer: Self-pay | Admitting: Internal Medicine

## 2019-11-05 NOTE — Telephone Encounter (Signed)
° °  Pt said she have a procedure tomorrow and she still doesn't know what she needs to do. She doesn't know what time to be there ot what to do prior procedure  Please advise

## 2019-11-05 NOTE — Telephone Encounter (Signed)
Left detailed message advising Pt to read her instruction letter-also copied and pasted instruction letter into mychart thread for Pt to review.  Advised to call office if any further questions.

## 2019-11-05 NOTE — Anesthesia Preprocedure Evaluation (Addendum)
Anesthesia Evaluation  Patient identified by MRN, date of birth, ID band Patient awake    Reviewed: Allergy & Precautions, NPO status , Patient's Chart, lab work & pertinent test results  History of Anesthesia Complications Negative for: history of anesthetic complications  Airway Mallampati: II  TM Distance: >3 FB Neck ROM: Full    Dental  (+) Chipped, Dental Advisory Given   Pulmonary neg pulmonary ROS,  11/04/2019 SARS coronavirus NEG   breath sounds clear to auscultation       Cardiovascular (-) hypertension+ dysrhythmias Atrial Fibrillation  Rhythm:Regular Rate:Normal  11/03/2019 ECHO: EF 60-65%, valves OK   Neuro/Psych Anxiety negative neurological ROS     GI/Hepatic Neg liver ROS, GERD  Controlled,  Endo/Other  obese  Renal/GU negative Renal ROS     Musculoskeletal  (+) Arthritis , Fibromyalgia -  Abdominal (+) + obese,   Peds  Hematology eliquis   Anesthesia Other Findings   Reproductive/Obstetrics                            Anesthesia Physical Anesthesia Plan  ASA: III  Anesthesia Plan: General   Post-op Pain Management:    Induction: Intravenous  PONV Risk Score and Plan: 3 and Ondansetron, Dexamethasone and Treatment may vary due to age or medical condition  Airway Management Planned: Oral ETT  Additional Equipment:   Intra-op Plan:   Post-operative Plan: Extubation in OR  Informed Consent: I have reviewed the patients History and Physical, chart, labs and discussed the procedure including the risks, benefits and alternatives for the proposed anesthesia with the patient or authorized representative who has indicated his/her understanding and acceptance.     Dental advisory given  Plan Discussed with: CRNA and Surgeon  Anesthesia Plan Comments:        Anesthesia Quick Evaluation

## 2019-11-06 ENCOUNTER — Ambulatory Visit (HOSPITAL_COMMUNITY): Payer: Medicare PPO | Admitting: Certified Registered Nurse Anesthetist

## 2019-11-06 ENCOUNTER — Ambulatory Visit (HOSPITAL_COMMUNITY)
Admission: RE | Admit: 2019-11-06 | Discharge: 2019-11-06 | Disposition: A | Discharge status: Home / Self Care | Payer: Medicare PPO | Attending: Internal Medicine | Admitting: Internal Medicine

## 2019-11-06 ENCOUNTER — Other Ambulatory Visit: Payer: Self-pay

## 2019-11-06 ENCOUNTER — Other Ambulatory Visit: Payer: Self-pay | Admitting: Internal Medicine

## 2019-11-06 ENCOUNTER — Ambulatory Visit (HOSPITAL_COMMUNITY)
Admission: RE | Disposition: A | Discharge status: Home / Self Care | Payer: Medicare PPO | Source: Home / Self Care | Attending: Internal Medicine

## 2019-11-06 DIAGNOSIS — E669 Obesity, unspecified: Secondary | ICD-10-CM | POA: Diagnosis not present

## 2019-11-06 DIAGNOSIS — G4700 Insomnia, unspecified: Secondary | ICD-10-CM | POA: Insufficient documentation

## 2019-11-06 DIAGNOSIS — I483 Typical atrial flutter: Secondary | ICD-10-CM | POA: Insufficient documentation

## 2019-11-06 DIAGNOSIS — Z6830 Body mass index (BMI) 30.0-30.9, adult: Secondary | ICD-10-CM | POA: Insufficient documentation

## 2019-11-06 DIAGNOSIS — Z79899 Other long term (current) drug therapy: Secondary | ICD-10-CM | POA: Diagnosis not present

## 2019-11-06 DIAGNOSIS — Z7901 Long term (current) use of anticoagulants: Secondary | ICD-10-CM | POA: Insufficient documentation

## 2019-11-06 DIAGNOSIS — Z882 Allergy status to sulfonamides status: Secondary | ICD-10-CM | POA: Diagnosis not present

## 2019-11-06 DIAGNOSIS — I341 Nonrheumatic mitral (valve) prolapse: Secondary | ICD-10-CM | POA: Insufficient documentation

## 2019-11-06 DIAGNOSIS — I480 Paroxysmal atrial fibrillation: Secondary | ICD-10-CM | POA: Insufficient documentation

## 2019-11-06 DIAGNOSIS — M797 Fibromyalgia: Secondary | ICD-10-CM | POA: Diagnosis not present

## 2019-11-06 DIAGNOSIS — Z885 Allergy status to narcotic agent status: Secondary | ICD-10-CM | POA: Diagnosis not present

## 2019-11-06 HISTORY — PX: ATRIAL FIBRILLATION ABLATION: EP1191

## 2019-11-06 LAB — POCT ACTIVATED CLOTTING TIME: Activated Clotting Time: 268 seconds

## 2019-11-06 SURGERY — ATRIAL FIBRILLATION ABLATION
Anesthesia: General

## 2019-11-06 MED ORDER — APIXABAN 5 MG PO TABS
5.0000 mg | ORAL_TABLET | ORAL | Status: AC
  Administered 2019-11-06: 5 mg via ORAL
  Filled 2019-11-06: qty 1

## 2019-11-06 MED ORDER — HEPARIN SODIUM (PORCINE) 1000 UNIT/ML IJ SOLN
INTRAMUSCULAR | Status: DC | PRN
  Administered 2019-11-06: 4000 [IU] via INTRAVENOUS

## 2019-11-06 MED ORDER — ISOPROTERENOL HCL 0.2 MG/ML IJ SOLN
INTRAVENOUS | Status: DC | PRN
  Administered 2019-11-06: 20 ug/min via INTRAVENOUS

## 2019-11-06 MED ORDER — SODIUM CHLORIDE 0.9% FLUSH: 3.0000 mL | INTRAVENOUS | Status: DC | PRN

## 2019-11-06 MED ORDER — ISOPROTERENOL HCL 0.2 MG/ML IJ SOLN
INTRAMUSCULAR | Status: AC
  Filled 2019-11-06: qty 5

## 2019-11-06 MED ORDER — ROCURONIUM BROMIDE 10 MG/ML (PF) SYRINGE
PREFILLED_SYRINGE | INTRAVENOUS | Status: DC | PRN
  Administered 2019-11-06: 60 mg via INTRAVENOUS

## 2019-11-06 MED ORDER — SODIUM CHLORIDE 0.9 % IV SOLN: 250.0000 mL | INTRAVENOUS | Status: DC | PRN

## 2019-11-06 MED ORDER — ONDANSETRON HCL 4 MG/2ML IJ SOLN
INTRAMUSCULAR | Status: DC | PRN
  Administered 2019-11-06: 4 mg via INTRAVENOUS

## 2019-11-06 MED ORDER — SODIUM CHLORIDE 0.9 % IV SOLN: INTRAVENOUS | Status: DC

## 2019-11-06 MED ORDER — HEPARIN SODIUM (PORCINE) 1000 UNIT/ML IJ SOLN
INTRAMUSCULAR | Status: DC | PRN
  Administered 2019-11-06: 14000 [IU] via INTRAVENOUS
  Administered 2019-11-06: 1000 [IU] via INTRAVENOUS

## 2019-11-06 MED ORDER — DEXAMETHASONE SODIUM PHOSPHATE 10 MG/ML IJ SOLN
INTRAMUSCULAR | Status: DC | PRN
  Administered 2019-11-06: 10 mg via INTRAVENOUS

## 2019-11-06 MED ORDER — HEPARIN SODIUM (PORCINE) 1000 UNIT/ML IJ SOLN
INTRAMUSCULAR | Status: AC
  Filled 2019-11-06: qty 2

## 2019-11-06 MED ORDER — PROPOFOL 10 MG/ML IV BOLUS
INTRAVENOUS | Status: DC | PRN
  Administered 2019-11-06: 90 mg via INTRAVENOUS

## 2019-11-06 MED ORDER — FENTANYL CITRATE (PF) 100 MCG/2ML IJ SOLN
INTRAMUSCULAR | Status: DC | PRN
  Administered 2019-11-06: 100 ug via INTRAVENOUS

## 2019-11-06 MED ORDER — PHENYLEPHRINE HCL-NACL 10-0.9 MG/250ML-% IV SOLN
INTRAVENOUS | Status: DC | PRN
  Administered 2019-11-06: 25 ug/min via INTRAVENOUS

## 2019-11-06 MED ORDER — ONDANSETRON HCL 4 MG/2ML IJ SOLN: 4.0000 mg | Freq: Four times a day (QID) | INTRAMUSCULAR | Status: DC | PRN

## 2019-11-06 MED ORDER — SODIUM CHLORIDE 0.9% FLUSH: 3.0000 mL | Freq: Two times a day (BID) | INTRAVENOUS | Status: DC

## 2019-11-06 MED ORDER — PROTAMINE SULFATE 10 MG/ML IV SOLN
INTRAVENOUS | Status: DC | PRN
Start: 2019-11-06 — End: 2019-11-06
  Administered 2019-11-06: 10 mg via INTRAVENOUS
  Administered 2019-11-06: 30 mg via INTRAVENOUS

## 2019-11-06 MED ORDER — PANTOPRAZOLE SODIUM 40 MG PO TBEC
40.0000 mg | DELAYED_RELEASE_TABLET | Freq: Every day | ORAL | 0 refills | Status: DC
Start: 2019-11-06 — End: 2019-11-06

## 2019-11-06 MED ORDER — ACETAMINOPHEN 325 MG PO TABS
650.0000 mg | ORAL_TABLET | ORAL | Status: DC | PRN
  Filled 2019-11-06: qty 2

## 2019-11-06 MED ORDER — LIDOCAINE 2% (20 MG/ML) 5 ML SYRINGE
INTRAMUSCULAR | Status: DC | PRN
  Administered 2019-11-06: 20 mg via INTRAVENOUS

## 2019-11-06 MED ORDER — HEPARIN (PORCINE) IN NACL 1000-0.9 UT/500ML-% IV SOLN
INTRAVENOUS | Status: AC
  Filled 2019-11-06: qty 500

## 2019-11-06 MED ORDER — SUGAMMADEX SODIUM 200 MG/2ML IV SOLN
INTRAVENOUS | Status: DC | PRN
Start: 2019-11-06 — End: 2019-11-06
  Administered 2019-11-06: 200 mg via INTRAVENOUS

## 2019-11-06 SURGICAL SUPPLY — 19 items
BLANKET WARM UNDERBOD FULL ACC (MISCELLANEOUS) ×2 IMPLANT
CATH 8FR REPROCESSED SOUNDSTAR (CATHETERS) ×2 IMPLANT
CATH MAPPNG PENTARAY F 2-6-2MM (CATHETERS) ×1 IMPLANT
CATH SMTCH THERMOCOOL SF DF (CATHETERS) ×2 IMPLANT
CATH WEBSTER BI DIR CS D-F CRV (CATHETERS) ×2 IMPLANT
COVER SWIFTLINK CONNECTOR (BAG) ×2 IMPLANT
DEVICE CLOSURE PERCLS PRGLD 6F (VASCULAR PRODUCTS) ×3 IMPLANT
NEEDLE BAYLIS TRANSSEPTAL 71CM (NEEDLE) ×2 IMPLANT
PACK EP LATEX FREE (CUSTOM PROCEDURE TRAY) ×2
PACK EP LF (CUSTOM PROCEDURE TRAY) ×1 IMPLANT
PAD PRO RADIOLUCENT 2001M-C (PAD) ×2 IMPLANT
PATCH CARTO3 (PAD) ×2 IMPLANT
PENTARAY F 2-6-2MM (CATHETERS) ×2
PERCLOSE PROGLIDE 6F (VASCULAR PRODUCTS) ×6
SHEATH PINNACLE 7F 10CM (SHEATH) ×4 IMPLANT
SHEATH PINNACLE 9F 10CM (SHEATH) ×2 IMPLANT
SHEATH PROBE COVER 6X72 (BAG) ×2 IMPLANT
SHEATH SWARTZ TS SL2 63CM 8.5F (SHEATH) ×2 IMPLANT
TUBING SMART ABLATE COOLFLOW (TUBING) ×2 IMPLANT

## 2019-11-06 NOTE — Transfer of Care (Signed)
Immediate Anesthesia Transfer of Care Note  Patient: Sheri Sims  Procedure(s) Performed: ATRIAL FIBRILLATION ABLATION (N/A )  Patient Location: Cath Lab  Anesthesia Type:General  Level of Consciousness: awake, alert  and oriented  Airway & Oxygen Therapy: Patient Spontanous Breathing and Patient connected to nasal cannula oxygen  Post-op Assessment: Report given to RN and Post -op Vital signs reviewed and stable  Post vital signs: Reviewed and stable  Last Vitals:  Vitals Value Taken Time  BP 138/88 11/06/19 1030  Temp    Pulse 66 11/06/19 1030  Resp 19 11/06/19 1030  SpO2 97 % 11/06/19 1030  Vitals shown include unvalidated device data.  Last Pain:  Vitals:   11/06/19 0610  TempSrc:   PainSc: 0-No pain      Patients Stated Pain Goal: 3 ([id number])  Complications: No apparent anesthesia complications

## 2019-11-06 NOTE — Discharge Instructions (Signed)
Post procedure care instructions No driving for 4 days. No lifting over 5 lbs for 1 week. No vigorous or sexual activity for 1 week. You may return to work/your usual activities on 11/13/19. Keep procedure site clean & dry. If you notice increased pain, swelling, bleeding or pus, call/return!  You may shower, but no soaking baths/hot tubs/pools for 1 week.     Cardiac Ablation, Care After  This sheet gives you information about how to care for yourself after your procedure. Your health care provider may also give you more specific instructions. If you have problems or questions, contact your health care provider. What can I expect after the procedure? After the procedure, it is common to have:  Bruising around your puncture site.  Tenderness around your puncture site.  Skipped heartbeats.  Tiredness (fatigue).  Follow these instructions at home: Puncture site care   Follow instructions from your health care provider about how to take care of your puncture site. Make sure you: ? If present, leave stitches (sutures), skin glue, or adhesive strips in place. These skin closures may need to stay in place for up to 2 weeks. If adhesive strip edges start to loosen and curl up, you may trim the loose edges. Do not remove adhesive strips completely unless your health care provider tells you to do that.  Check your puncture site every day for signs of infection. Check for: ? Redness, swelling, or pain. ? Fluid or blood. If your puncture site starts to bleed, lie down on your back, apply firm pressure to the area, and contact your health care provider. ? Warmth. ? Pus or a bad smell. Driving  Do not drive for at least 4 days after your procedure or however long your health care provider recommends. (Do not resume driving if you have previously been instructed not to drive for other health reasons.)  Do not drive or use heavy machinery while taking prescription pain medicine. Activity  Avoid  activities that take a lot of effort for at least 7 days after your procedure.  Do not lift anything that is heavier than 5 lb (4.5 kg) for one week.   No sexual activity for 1 week.   Return to your normal activities as told by your health care provider. Ask your health care provider what activities are safe for you. General instructions  Take over-the-counter and prescription medicines only as told by your health care provider.  Do not use any products that contain nicotine or tobacco, such as cigarettes and e-cigarettes. If you need help quitting, ask your health care provider.  You may shower after 24 hours, but Do not take baths, swim, or use a hot tub for 1 week.   Do not drink alcohol for 24 hours after your procedure.  Keep all follow-up visits as told by your health care provider. This is important. Contact a health care provider if:  You have redness, mild swelling, or pain around your puncture site.  You have fluid or blood coming from your puncture site that stops after applying firm pressure to the area.  Your puncture site feels warm to the touch.  You have pus or a bad smell coming from your puncture site.  You have a fever.  You have chest pain or discomfort that spreads to your neck, jaw, or arm.  You are sweating a lot.  You feel nauseous.  You have a fast or irregular heartbeat.  You have shortness of breath.  You are dizzy  or light-headed and feel the need to lie down.  You have pain or numbness in the arm or leg closest to your puncture site. Get help right away if:  Your puncture site suddenly swells.  Your puncture site is bleeding and the bleeding does not stop after applying firm pressure to the area. These symptoms may represent a serious problem that is an emergency. Do not wait to see if the symptoms will go away. Get medical help right away. Call your local emergency services (911 in the [place] Do not drive yourself to the  hospital. Summary  After the procedure, it is normal to have bruising and tenderness at the puncture site in your groin, neck, or forearm.  Check your puncture site every day for signs of infection.  Get help right away if your puncture site is bleeding and the bleeding does not stop after applying firm pressure to the area. This is a medical emergency. This information is not intended to replace advice given to you by your health care provider. Make sure you discuss any questions you have with your health care provider.    You have an appointment set up with the [facility].  Multiple studies have shown that being followed by a dedicated atrial fibrillation clinic in addition to the standard care you receive from your other physicians improves health. We believe that enrollment in the atrial fibrillation clinic will allow [place] to better care for you.   The phone number to the [facility] is [phone]. The clinic is staffed Monday through Friday from 8:30am to 5pm.  Parking Directions: The clinic is located in the Heart and Vascular Building connected to [facility]. 1)From [address] turn on to [address] and go to the 3rd entrance  (Heart and Vascular entrance) on the right. 2)Look to the right for Heart &Vascular Parking Garage. 3)A code for the entrance is required for May is 5008.   4)Take the elevators to the 1st floor. Registration is in the room with the glass walls at the end of the hallway.  If you have any trouble parking or locating the clinic, please don't hesitate to call [phone].

## 2019-11-06 NOTE — Interval H&P Note (Signed)
History and Physical Interval Note:  11/06/2019 7:28 AM  [name]  has presented today for surgery, with the diagnosis of Atrial fibrillation.  The various methods of treatment have been discussed with the patient and family. After consideration of risks, benefits and other options for treatment, the patient has consented to  Procedure(s): ATRIAL FIBRILLATION ABLATION (N/A) as a surgical intervention.  The patient's history has been reviewed, patient examined, no change in status, stable for surgery.  I have reviewed the patient's chart and labs.  Questions were answered to the patient's satisfaction.    Cardiac CT reviewed with her at length.  She will follow-up with Dr Melvyn Novas regarding pulmonary nodules. Reports compliance with eliquis without interruption.   Sheri Sims

## 2019-11-06 NOTE — Progress Notes (Signed)
Assumed care of pt from Shary Decamp, RN. Assessment documented.

## 2019-11-06 NOTE — Anesthesia Postprocedure Evaluation (Signed)
Anesthesia Post Note  Patient: Sheri Sims  Procedure(s) Performed: ATRIAL FIBRILLATION ABLATION (N/A )     Patient location during evaluation: Cath Lab Anesthesia Type: General Level of consciousness: awake and alert, oriented and patient cooperative Pain management: pain level controlled Vital Signs Assessment: post-procedure vital signs reviewed and stable Respiratory status: spontaneous breathing, nonlabored ventilation, respiratory function stable and patient connected to nasal cannula oxygen Cardiovascular status: blood pressure returned to baseline and stable Postop Assessment: no apparent nausea or vomiting Anesthetic complications: no    Last Vitals:  Vitals:   11/06/19 1045 11/06/19 1100  BP: (!) 143/83 140/67  Pulse: 63 (!) 59  Resp: 14 10  Temp:  36.5 C  SpO2: 100% 99%    Last Pain:  Vitals:   11/06/19 1117  TempSrc:   PainSc: 5                  Vivian Neuwirth,E. Clavin Ruhlman

## 2019-11-06 NOTE — Anesthesia Procedure Notes (Signed)
Procedure Name: Intubation Date/Time: 11/06/2019 7:55 AM Performed by: Candis Shine, CRNA Pre-anesthesia Checklist: Patient identified, Emergency Drugs available, Suction available and Patient being monitored Patient Re-evaluated:Patient Re-evaluated prior to induction Oxygen Delivery Method: Circle System Utilized Preoxygenation: Pre-oxygenation with 100% oxygen Induction Type: IV induction Ventilation: Mask ventilation without difficulty and Oral airway inserted - appropriate to patient size Laryngoscope Size: Mac and 3 Grade View: Grade II Tube type: Oral Tube size: 7.0 mm Number of attempts: 1 Airway Equipment and Method: Oral airway and Bougie stylet Placement Confirmation: ETT inserted through vocal cords under direct vision,  positive ETCO2 and breath sounds checked- equal and bilateral Secured at: 22 cm Tube secured with: Tape Dental Injury: Teeth and Oropharynx as per pre-operative assessment  Difficulty Due To: Difficult Airway- due to anterior larynx Comments: DL x 1 with Mac 3, Grade IIb view. Pt intubated atraumatically using bougie stylet.

## 2019-11-07 MED FILL — Heparin Sod (Porcine)-NaCl IV Soln 1000 Unit/500ML-0.9%: INTRAVENOUS | Qty: 1000 | Status: AC

## 2019-11-10 ENCOUNTER — Other Ambulatory Visit: Payer: Self-pay

## 2019-11-10 ENCOUNTER — Telehealth: Payer: Self-pay | Admitting: Internal Medicine

## 2019-11-10 ENCOUNTER — Emergency Department (HOSPITAL_BASED_OUTPATIENT_CLINIC_OR_DEPARTMENT_OTHER): Payer: Medicare PPO

## 2019-11-10 ENCOUNTER — Emergency Department (HOSPITAL_BASED_OUTPATIENT_CLINIC_OR_DEPARTMENT_OTHER)
Admission: EM | Admit: 2019-11-10 | Discharge: 2019-11-10 | Disposition: A | Discharge status: Home / Self Care | Payer: Medicare PPO | Attending: Emergency Medicine | Admitting: Emergency Medicine

## 2019-11-10 ENCOUNTER — Encounter (HOSPITAL_BASED_OUTPATIENT_CLINIC_OR_DEPARTMENT_OTHER): Payer: Self-pay

## 2019-11-10 DIAGNOSIS — Z79899 Other long term (current) drug therapy: Secondary | ICD-10-CM | POA: Diagnosis not present

## 2019-11-10 DIAGNOSIS — R7989 Other specified abnormal findings of blood chemistry: Secondary | ICD-10-CM | POA: Insufficient documentation

## 2019-11-10 DIAGNOSIS — R0602 Shortness of breath: Secondary | ICD-10-CM | POA: Insufficient documentation

## 2019-11-10 DIAGNOSIS — R079 Chest pain, unspecified: Secondary | ICD-10-CM | POA: Diagnosis not present

## 2019-11-10 DIAGNOSIS — I4891 Unspecified atrial fibrillation: Secondary | ICD-10-CM | POA: Diagnosis not present

## 2019-11-10 DIAGNOSIS — R778 Other specified abnormalities of plasma proteins: Secondary | ICD-10-CM

## 2019-11-10 DIAGNOSIS — R000 Tachycardia, unspecified: Secondary | ICD-10-CM | POA: Diagnosis present

## 2019-11-10 DIAGNOSIS — Z7901 Long term (current) use of anticoagulants: Secondary | ICD-10-CM | POA: Diagnosis not present

## 2019-11-10 LAB — BASIC METABOLIC PANEL
Anion gap: 9 (ref 5–15)
BUN: 15 mg/dL (ref 8–23)
CO2: 26 mmol/L (ref 22–32)
Calcium: 9.1 mg/dL (ref 8.9–10.3)
Chloride: 103 mmol/L (ref 98–111)
Creatinine, Ser: 0.68 mg/dL (ref 0.44–1.00)
GFR calc Af Amer: >60 mL/min (ref >60)
GFR calc non Af Amer: >60 mL/min (ref >60)
Glucose, Bld: 147 mg/dL — ABNORMAL HIGH (ref 70–99)
Potassium: 4.1 mmol/L (ref 3.5–5.1)
Sodium: 138 mmol/L (ref 135–145)

## 2019-11-10 LAB — CBC
HCT: 46.7 % — ABNORMAL HIGH (ref 36.0–46.0)
Hemoglobin: 15.6 g/dL — ABNORMAL HIGH (ref 12.0–15.0)
MCH: 32.9 pg (ref 26.0–34.0)
MCHC: 33.4 g/dL (ref 30.0–36.0)
MCV: 98.5 fL (ref 80.0–100.0)
Platelets: 221 10*3/uL (ref 150–400)
RBC: 4.74 MIL/uL (ref 3.87–5.11)
RDW: 12.8 % (ref 11.5–15.5)
WBC: 5.7 10*3/uL (ref 4.0–10.5)
nRBC: 0 % (ref 0.0–0.2)

## 2019-11-10 LAB — TSH: TSH: 1.657 u[IU]/mL (ref 0.350–4.500)

## 2019-11-10 LAB — TROPONIN I (HIGH SENSITIVITY)
Troponin I (High Sensitivity): 152 ng/L (ref <18)
Troponin I (High Sensitivity): 142 ng/L (ref <18)

## 2019-11-10 LAB — MAGNESIUM: Magnesium: 2.2 mg/dL (ref 1.7–2.4)

## 2019-11-10 MED ORDER — METOPROLOL SUCCINATE ER 25 MG PO TB24
25.0000 mg | ORAL_TABLET | Freq: Once | ORAL | Status: DC
  Filled 2019-11-10: qty 1

## 2019-11-10 MED ORDER — SODIUM CHLORIDE 0.9% FLUSH
3.0000 mL | Freq: Once | INTRAVENOUS | Status: DC
  Filled 2019-11-10: qty 3

## 2019-11-10 NOTE — ED Notes (Signed)
Pt went to BR when brought back to room.

## 2019-11-10 NOTE — Discharge Instructions (Signed)
Please call the cardiology office tomorrow morning to ensure you have a follow-up appointment tomorrow.  If you have worsening chest pain or difficulty in breathing, recommend going to ER for reevaluation.

## 2019-11-10 NOTE — Telephone Encounter (Signed)
I spoke to the patient who called because she had an ablation last Thursday.    For the last 2 days, she has experienced SOB, Tachycardia and Chest discomfort, radiating down her left arm.  I recommended that she go to the ED for further evaluation, although she may head to the Urgent care near her home.  She verbalized understanding.

## 2019-11-10 NOTE — ED Provider Notes (Signed)
[facility] EMERGENCY DEPARTMENT Provider Note   CSN: [id number] Arrival date & time: 11/10/19  1642     History Chief Complaint  Patient presents with  . Tachycardia  . Chest Pain    Sheri Sims is a 69 y.o. female.  Presents to ER with chief complaint of having episodes of chest discomfort and shortness of breath.  Patient states she she had some ablation she initially was doing well however the past day or so she has been having intermittent episodes of feeling somewhat lightheaded, short of breath and discomfort across her chest.  Episodes are somewhat random, seem to be worse with exertion.  Says that she frequently had episodes like this prior to the ablation.  Currently denies any shortness of breath or chest pain.  Patient reports compliance with anticoagulation, has been taking metoprolol as prescribed, per cardiology recommendations stopped her flecainide post ablation.   Additional history obtained via chart review, ablation for A. fib on 5/6 with Dr. Rayann Heman.  HPI     Past Medical History:  Diagnosis Date  . Atrial flutter ([place])   . Costochondritis   . Enthesopathy of hip region   . Fatigue   . Fibromyalgia   . Helicobacter pylori infection   . Insomnia   . MVP (mitral valve prolapse)    OF ANTERIOR MITRAL VALVE LEAFLET  . Obesity   . Paroxysmal atrial fibrillation ([facility])   . Pharyngitis   . Pneumonia   . Sinusitis, chronic     Patient Active Problem List   Diagnosis Date Noted  . A-fib ([place]) 09/22/2019  . URI (upper respiratory infection) 08/20/2018  . Pain in right knee 01/09/2018  . Wheezing 11/05/2017  . Mitral valve prolapse 08/02/2015  . Arm pain 07/21/2015  . Foot pain 07/21/2015  . Awareness of heartbeats 07/21/2015  . Osteopenia 07/21/2015  . Symptoms involving urinary system 07/21/2015  . Insomnia, persistent 07/21/2015  . Abnormal chest x-ray 02/02/2015  . Arthritis 02/02/2015  . Calcaneal spur 02/02/2015  . Cardiac  conduction disorder 02/02/2015  . Acid reflux 02/02/2015  . Fatigue 02/02/2015  . Heel pain 02/02/2015  . HLD (hyperlipidemia) 02/02/2015  . Idiopathic angio-edema-urticaria 02/02/2015  . Elevated fasting blood sugar 02/02/2015  . Encounter for general adult medical examination without abnormal findings 02/02/2015  . Disorder of hematopoietic structure 02/02/2015  . Back ache 02/02/2015  . Pleurisy 02/02/2015  . Posterior vitreous detachment 02/02/2015  . Temporomandibular joint disorder 02/02/2015  . Bilateral arm pain 02/02/2015  . Pulmonary infiltrates 02/13/2014  . Fibromyalgia 11/03/2013  . Heart palpitations 11/03/2013  . Acute respiratory failure with hypoxia ([place]) 11/03/2013  . Chest pain, pleuritic 11/03/2013  . Pneumonia 11/03/2013  . Myalgia and myositis 07/11/2013  . Osteoarthritis, hand 07/11/2013  . Anxiety 03/28/2013  . Positive antinuclear antibody 03/28/2013    Past Surgical History:  Procedure Laterality Date  . APPENDECTOMY    . ATRIAL FIBRILLATION ABLATION N/A 11/06/2019   Procedure: ATRIAL FIBRILLATION ABLATION;  Surgeon: Thompson Grayer, MD;  Location: [facility];  Service: Cardiovascular;  Laterality: N/A;  . CARDIAC CATHETERIZATION  2010   Normal  . STRESS ECHO TEST  05/31/2010   NO ISCHEMIA  . TONSILLECTOMY    . TRANSTHORACIC ECHOCARDIOGRAM  05/16/2010   EF 55-60%     OB History   No obstetric history on file.     Family History  Problem Relation Age of Onset  . Breast cancer Mother   . Breast cancer Sister  Social History   Tobacco Use  . Smoking status: Never Smoker  . Smokeless tobacco: Never Used  Substance Use Topics  . Alcohol use: No  . Drug use: No    Home Medications Prior to Admission medications   Medication Sig Start Date End Date Taking? Authorizing Provider  acetaminophen (TYLENOL 8 HOUR ARTHRITIS PAIN) 650 MG CR tablet Take 650 mg by mouth every 8 (eight) hours as needed for pain.  05/04/15   [provider]  apixaban (ELIQUIS) 5 MG TABS tablet Take 1 tablet (5 mg total) by mouth 2 (two) times daily. 09/30/19   Deboraha Sprang, MD  Calcium Carbonate (CALCIUM 600 PO) Take 1 tablet by mouth 3 (three) times a week.  06/03/19   [provider]  calcium carbonate (TUMS - DOSED IN MG ELEMENTAL CALCIUM) 500 MG chewable tablet Chew 1 tablet by mouth 2 (two) times daily as needed for indigestion or heartburn.    [provider]  Cetirizine HCl (ZYRTEC ALLERGY) 10 MG CAPS Take 5 mg by mouth at bedtime as needed (allergies).  05/04/15   [provider]  cholecalciferol (VITAMIN D) 1000 UNITS tablet Take 1,000 Units by mouth every other day.     [provider]  HYDROcodone-homatropine (HYCODAN) 5-1.5 MG/5ML syrup Take 5 mLs by mouth every 6 (six) hours as needed for cough.    [provider]  loratadine (CLARITIN) 10 MG tablet Take 10 mg by mouth daily as needed for allergies.    [provider]  metoprolol succinate (TOPROL-XL) 25 MG 24 hr tablet Take 1 tablet (25 mg total) by mouth daily. 05/19/11   Burtis Junes, NP  multivitamin [facility]) per tablet Take 1 tablet by mouth every other day.     [provider]  pantoprazole (PROTONIX) 40 MG tablet TAKE 1 TABLET(40 MG) BY MOUTH DAILY 11/06/19   Allred, Jeneen Rinks, MD  Polyethyl Glycol-Propyl Glycol (SYSTANE ULTRA) 0.4-0.3 % SOLN Place 1 drop into both eyes in the morning and at bedtime.    [provider]  sodium chloride (OCEAN) 0.65 % SOLN nasal spray Place 1 spray into both nostrils as needed for congestion.    [provider]  vitamin B-12 (CYANOCOBALAMIN) 500 MCG tablet Take 500 mcg by mouth every other day.    [provider]    Allergies    Codeine, Sulfur dioxide, Sulfites, Codeine phosphate, Other, and Sulfa antibiotics  Review of Systems   Review of Systems  Constitutional: Negative for chills and fever.  HENT: Negative for ear pain and sore throat.    Eyes: Negative for pain and visual disturbance.  Respiratory: Positive for shortness of breath. Negative for cough.   Cardiovascular: Positive for chest pain. Negative for palpitations.  Gastrointestinal: Negative for abdominal pain and vomiting.  Genitourinary: Negative for dysuria and hematuria.  Musculoskeletal: Negative for arthralgias and back pain.  Skin: Negative for color change and rash.  Neurological: Negative for seizures and syncope.  All other systems reviewed and are negative.   Physical Exam Updated Vital Signs BP 123/71   Pulse 81   Temp 98.1 F (36.7 C) (Oral)   Resp 17   SpO2 96%   Physical Exam Vitals and nursing note reviewed.  Constitutional:      General: She is not in acute distress.    Appearance: She is well-developed.  HENT:     Head: Normocephalic and atraumatic.  Eyes:     Conjunctiva/sclera: Conjunctivae normal.  Cardiovascular:  Rate and Rhythm: Normal rate and regular rhythm.     Heart sounds: No murmur.  Pulmonary:     Effort: Pulmonary effort is normal. No respiratory distress.     Breath sounds: Normal breath sounds.  Abdominal:     Palpations: Abdomen is soft.     Tenderness: There is no abdominal tenderness.  Musculoskeletal:     Cervical back: Neck supple.     Right lower leg: No edema.     Left lower leg: No edema.  Skin:    General: Skin is warm and dry.     Capillary Refill: Capillary refill takes less than 2 seconds.  Neurological:     General: No focal deficit present.     Mental Status: She is alert and oriented to person, place, and time.     ED Results / Procedures / Treatments   Labs (all labs ordered are listed, but only abnormal results are displayed) Labs Reviewed  BASIC METABOLIC PANEL - Abnormal; Notable for the following components:      Result Value   Glucose, Bld 147 (*)    All other components within normal limits  CBC - Abnormal; Notable for the following components:   Hemoglobin 15.6 (*)    HCT  46.7 (*)    All other components within normal limits  TROPONIN I (HIGH SENSITIVITY) - Abnormal; Notable for the following components:   Troponin I (High Sensitivity) 152 (*)    All other components within normal limits  TROPONIN I (HIGH SENSITIVITY) - Abnormal; Notable for the following components:   Troponin I (High Sensitivity) 142 (*)    All other components within normal limits  MAGNESIUM  TSH    EKG None  Radiology DG Chest 2 View  Result Date: 11/10/2019 CLINICAL DATA:  Chest pain and shortness of breath, history of cardiac ablation 1 week ago EXAM: CHEST - 2 VIEW COMPARISON:  10/10/2019 FINDINGS: Cardiac shadow is stable. Mild aortic calcifications are noted. The lungs are well aerated bilaterally without focal infiltrate or sizable effusion. Minimal scarring in the right middle lobe is again seen and stable. No pneumothorax is noted. No sizable effusion is seen. No bony abnormality is noted. IMPRESSION: Stable scarring in the right base. No other focal abnormality is noted. Electronically Signed   By: Inez Catalina M.D.   On: 11/10/2019 18:18    Procedures Procedures (including critical care time)  Medications Ordered in ED Medications  sodium chloride flush (NS) 0.9 % injection 3 mL (3 mLs Intravenous Not Given 11/10/19 1742)  metoprolol succinate (TOPROL-XL) 24 hr tablet 25 mg (0 mg Oral Hold 11/10/19 1921)    ED Course  I have reviewed the triage vital signs and the nursing notes.  Pertinent labs & imaging results that were available during my care of the patient were reviewed by me and considered in my medical decision making (see chart for details).  Clinical Course as of Nov 10 2339  Mon Nov 10, 2019  1905 D/w Meda Coffee, rec extra dose of metop, repeat trop, if downtrending, symptoms controlled, dc with close EP f/u in out pt   [RD]  2137 Talked to cards - they will route message to scheduler to ensure pt has f/u appt tomorrow with EP   [RD]    Clinical Course User  Index [RD] Lucrezia Starch, MD   MDM Rules/Calculators/A&P                      69 year old lady  recent ablation for A. fib on 5/6 presenting with episodes of chest discomfort/shortness of breath.  On exam, patient noted to be well-appearing with stable vital signs. Compliant with anticoagulation, doubt PE. CXR neg for pna. EKG showed patient is in sinus rhythm but has frequent PACs.  No ischemic changes.  Initial troponin was elevated.  Reviewed case in detail with Dr. Meda Coffee on-call for cardiology.  She states that the troponin elevation today is within expected range of recent ablation.  She felt the patient's symptoms were controlled and troponin downtrending, then recommended discharge with follow-up in EP office tomorrow.  Patient ambulated throughout the department to the bathroom, hallway without difficulty, no ongoing symptoms.  Repeat troponin was downtrending.  Patient and husband aware of need to get follow-up appointment tomorrow with EP, return to ER for any recurrent or worsening symptoms.  After the discussed management above, the patient was determined to be safe for discharge.  The patient was in agreement with this plan and all questions regarding their care were answered.  ED return precautions were discussed and the patient will return to the ED with any significant worsening of condition.  Final Clinical Impression(s) / ED Diagnoses Final diagnoses:  Chest pain, unspecified type  Elevated troponin    Rx / DC Orders ED Discharge Orders    None       Lucrezia Starch, MD 11/10/19 2342

## 2019-11-10 NOTE — ED Notes (Signed)
[facility] notified (Tammy) - consult to inpatient cardiology

## 2019-11-10 NOTE — Telephone Encounter (Signed)
New message  Pt c/o Shortness Of Breath: STAT if SOB developed within the last 24 hours or pt is noticeably SOB on the phone  1. Are you currently SOB (can you hear that pt is SOB on the phone)? Yes  2. How long have you been experiencing SOB? Today  3. Are you SOB when sitting or when up moving around? Up and moving around  4. Are you currently experiencing any other symptoms? Lightheaded, SOB, HR 102, Chest discomfort, pain in left arm.

## 2019-11-10 NOTE — ED Notes (Signed)
Ambulated pt in hall; HR 88-100; sts she felt a little lightheaded; upon lying back down, pt felt slightly SHOB (no distress) and "funny in my chest."

## 2019-11-10 NOTE — ED Triage Notes (Signed)
Pt states she had an ablation last week-states she did at home EKG that read SVT-states she is having "chest discomfort" and SOB x 2 hours-NAD-to triage in w/c

## 2019-11-11 ENCOUNTER — Telehealth: Payer: Self-pay

## 2019-11-11 ENCOUNTER — Telehealth: Payer: Self-pay | Admitting: Internal Medicine

## 2019-11-11 ENCOUNTER — Ambulatory Visit (HOSPITAL_COMMUNITY): Payer: Medicare PPO | Admitting: Nurse Practitioner

## 2019-11-11 NOTE — Telephone Encounter (Signed)
New Message     Pt c/o Shortness Of Breath: STAT if SOB developed within the last 24 hours or pt is noticeably SOB on the phone  1. Are you currently SOB (can you hear that pt is SOB on the phone)? No   2. How long have you been experiencing SOB? Pt says she has had spells in the past but Saturday and Sunday she noticed the irregular heart beat but went to the hospital Monday for her symptoms worsening   3. Are you SOB when sitting or when up moving around? Moving around   4. Are you currently experiencing any other symptoms? Pt says when she gets up her heart is irregular. She says it beats harder when she gets up to move around. She feels SOB when this happens and she feels weak Pt is wanting an appt sooner then Friday    Please call

## 2019-11-11 NOTE — Telephone Encounter (Signed)
Pt scheduled with afib clinic

## 2019-11-11 NOTE — Telephone Encounter (Signed)
Spoke to patient 5/11

## 2019-11-11 NOTE — Telephone Encounter (Signed)
Patient called to clinic stating she was feeling much better now and does not feel appt is needed today. She is in NSR in the 80-90s.  She has follow up on Friday and will continue to monitor her BP/HR until then.

## 2019-11-11 NOTE — Telephone Encounter (Signed)
I spoke to the patient and informed her that it was recommended that she go to the [facility] today 5/11 at 2 pm.  She verbalized understanding.

## 2019-11-14 ENCOUNTER — Ambulatory Visit: Payer: Medicare PPO | Admitting: Physician Assistant

## 2019-11-14 ENCOUNTER — Other Ambulatory Visit: Payer: Self-pay

## 2019-11-14 ENCOUNTER — Ambulatory Visit (HOSPITAL_COMMUNITY)
Admission: RE | Admit: 2019-11-14 | Discharge: 2019-11-14 | Disposition: A | Discharge status: Home / Self Care | Payer: Medicare PPO | Source: Ambulatory Visit | Attending: Physician Assistant | Admitting: Physician Assistant

## 2019-11-14 VITALS — BP 126/70 | HR 91 | Ht 65.0 in | Wt 182.0 lb

## 2019-11-14 DIAGNOSIS — I483 Typical atrial flutter: Secondary | ICD-10-CM | POA: Diagnosis not present

## 2019-11-14 DIAGNOSIS — I480 Paroxysmal atrial fibrillation: Secondary | ICD-10-CM | POA: Insufficient documentation

## 2019-11-14 DIAGNOSIS — Z79899 Other long term (current) drug therapy: Secondary | ICD-10-CM | POA: Insufficient documentation

## 2019-11-14 DIAGNOSIS — Z6830 Body mass index (BMI) 30.0-30.9, adult: Secondary | ICD-10-CM | POA: Diagnosis not present

## 2019-11-14 DIAGNOSIS — M797 Fibromyalgia: Secondary | ICD-10-CM | POA: Insufficient documentation

## 2019-11-14 DIAGNOSIS — E669 Obesity, unspecified: Secondary | ICD-10-CM | POA: Diagnosis not present

## 2019-11-14 DIAGNOSIS — Z7901 Long term (current) use of anticoagulants: Secondary | ICD-10-CM | POA: Diagnosis not present

## 2019-11-14 MED ORDER — METOPROLOL SUCCINATE ER 25 MG PO TB24: ORAL_TABLET | ORAL | 2 refills | Status: DC

## 2019-11-14 NOTE — Progress Notes (Signed)
Primary Care Physician: Aletha Halim., PA-C Primary Electrophysiologist: Dr Caryl Comes Referring Physician: Dr Gaylan Gerold is a 69 y.o. female with a history of paroxysmal atrial fibrillation, atrial flutter, and MVP who presents for follow up in the [facility]. Patient unfortunately had more episodes of atrial fibrillation and atrial flutter despite the higher dose of flecainide. She underwent afib and flutter ablation with Dr Rayann Heman on 11/10/19. She did visit the ED on 11/10/19 with CP and an elevated troponin which were felt to be 2/2 her ablation. She has seen several brief episodes of afib on her Kardia device. She is in SR today. Over the last two days she has felt better with less palpitations and more energy. She denies CP, swallowing, or groin issues.   Today, she denies symptoms of chest pain, shortness of breath, orthopnea, PND, lower extremity edema, dizziness, presyncope, syncope, snoring, daytime somnolence, bleeding, or neurologic sequela. The patient is tolerating medications without difficulties and is otherwise without complaint today.    Atrial Fibrillation Risk Factors:  she does not have symptoms or diagnosis of sleep apnea. she does not have a history of rheumatic fever. she does not have a history of alcohol use. The patient does not have a history of early familial atrial fibrillation or other arrhythmias.  she has a BMI of Body mass index is 30.29 kg/m.Marland Kitchen Filed Weights   11/14/19 1050  Weight: 82.6 kg    Family History  Problem Relation Age of Onset  . Breast cancer Mother   . Breast cancer Sister      Atrial Fibrillation Management history:  Previous antiarrhythmic drugs: flecainide Previous cardioversions: none Previous ablations: 11/10/19 CHADS2VASC score: 2 (age, female) Anticoagulation history: Xarelto   Past Medical History:  Diagnosis Date  . Atrial flutter ([place])   . Costochondritis   . Enthesopathy  of hip region   . Fatigue   . Fibromyalgia   . Helicobacter pylori infection   . Insomnia   . MVP (mitral valve prolapse)    OF ANTERIOR MITRAL VALVE LEAFLET  . Obesity   . Paroxysmal atrial fibrillation ([facility])   . Pharyngitis   . Pneumonia   . Sinusitis, chronic    Past Surgical History:  Procedure Laterality Date  . APPENDECTOMY    . ATRIAL FIBRILLATION ABLATION N/A 11/06/2019   Procedure: ATRIAL FIBRILLATION ABLATION;  Surgeon: Thompson Grayer, MD;  Location: [facility];  Service: Cardiovascular;  Laterality: N/A;  . CARDIAC CATHETERIZATION  2010   Normal  . STRESS ECHO TEST  05/31/2010   NO ISCHEMIA  . TONSILLECTOMY    . TRANSTHORACIC ECHOCARDIOGRAM  05/16/2010   EF 55-60%    Current Outpatient Medications  Medication Sig Dispense Refill  . acetaminophen (TYLENOL 8 HOUR ARTHRITIS PAIN) 650 MG CR tablet Take 650 mg by mouth every 8 (eight) hours as needed for pain.     Marland Kitchen apixaban (ELIQUIS) 5 MG TABS tablet Take 1 tablet (5 mg total) by mouth 2 (two) times daily. 180 tablet 3  . Calcium Carbonate (CALCIUM 600 PO) Take 1 tablet by mouth 3 (three) times a week.     . Cetirizine HCl (ZYRTEC ALLERGY) 10 MG CAPS Take 5 mg by mouth at bedtime as needed (allergies).     . cholecalciferol (VITAMIN D) 1000 UNITS tablet Take 1,000 Units by mouth every other day.     Marland Kitchen HYDROcodone-homatropine (HYCODAN) 5-1.5 MG/5ML syrup Take 5 mLs by mouth every 6 (six)  hours as needed for cough.    . loratadine (CLARITIN) 10 MG tablet Take 10 mg by mouth daily as needed for allergies.    . multivitamin (THERAGRAN) per tablet Take 1 tablet by mouth every other day.     . pantoprazole (PROTONIX) 40 MG tablet TAKE 1 TABLET(40 MG) BY MOUTH DAILY 90 tablet 3  . Polyethyl Glycol-Propyl Glycol (SYSTANE ULTRA) 0.4-0.3 % SOLN Place 1 drop into both eyes in the morning and at bedtime.    . sodium chloride (OCEAN) 0.65 % SOLN nasal spray Place 1 spray into both nostrils as needed for congestion.    . vitamin  B-12 (CYANOCOBALAMIN) 500 MCG tablet Take 500 mcg by mouth every other day.    . calcium carbonate (TUMS - DOSED IN MG ELEMENTAL CALCIUM) 500 MG chewable tablet Chew 1 tablet by mouth as needed for indigestion or heartburn.     . metoprolol succinate (TOPROL-XL) 25 MG 24 hr tablet Take 1.5 tablets by mouth once a day 30 tablet 2   No current facility-administered medications for this encounter.    Allergies  Allergen Reactions  . Codeine Nausea And Vomiting and Nausea Only  . Sulfur Dioxide Nausea Only  . Sulfites Nausea And Vomiting  . Codeine Phosphate Nausea And Vomiting  . Other     Prednisone cream caused blisters   . Sulfa Antibiotics Nausea And Vomiting    Social History   Socioeconomic History  . Marital status: Married    Spouse name: Not on file  . Number of children: 2  . Years of education: Not on file  . Highest education level: Not on file  Occupational History  . Occupation: Retired Tourist information centre manager  Tobacco Use  . Smoking status: Never Smoker  . Smokeless tobacco: Never Used  Substance and Sexual Activity  . Alcohol use: No  . Drug use: No  . Sexual activity: Not on file  Other Topics Concern  . Not on file  Social History Narrative   Lives in [place] with spouse.   Social Determinants of Health   Financial Resource Strain:   . Difficulty of Paying Living Expenses:   Food Insecurity:   . Worried About Charity fundraiser in the Last Year:   . Arboriculturist in the Last Year:   Transportation Needs:   . Film/video editor (Medical):   Marland Kitchen Lack of Transportation (Non-Medical):   Physical Activity:   . Days of Exercise per Week:   . Minutes of Exercise per Session:   Stress:   . Feeling of Stress :   Social Connections:   . Frequency of Communication with Friends and Family:   . Frequency of Social Gatherings with Friends and Family:   . Attends Religious Services:   . Active Member of Clubs or Organizations:   . Attends Archivist  Meetings:   Marland Kitchen Marital Status:   Intimate Partner Violence:   . Fear of Current or Ex-Partner:   . Emotionally Abused:   Marland Kitchen Physically Abused:   . Sexually Abused:      ROS- All systems are reviewed and negative except as per the HPI above.  Physical Exam: Vitals:   11/14/19 1050  BP: 126/70  Pulse: 91  Weight: 82.6 kg  Height: 5\' 5"  (1.651 m)    GEN- The patient is well appearing obese female, alert and oriented x 3 today.   HEENT-head normocephalic, atraumatic, sclera clear, conjunctiva pink, hearing intact, trachea midline. Lungs- Clear to ausculation  bilaterally, normal work of breathing Heart- Regular rate and rhythm, occasional ectopic beat, no murmurs, rubs or gallops  GI- soft, NT, ND, + BS Extremities- no clubbing, cyanosis, or edema MS- no significant deformity or atrophy Skin- no rash or lesion Psych- euthymic mood, full affect Neuro- strength and sensation are intact   Wt Readings from Last 3 Encounters:  11/14/19 82.6 kg  11/06/19 83 kg  10/22/19 83.9 kg    EKG today demonstrates SR HR 91, frequent PACs, PR 136, QRS 68, QTc 445  Echo 07/19/17 demonstrated  - Left ventricle: The cavity size was normal. Wall thickness was   normal. Systolic function was normal. The estimated ejection   fraction was in the range of 60% to 65%. Wall motion was normal;   there were no regional wall motion abnormalities. Features are   consistent with a pseudonormal left ventricular filling pattern,   with concomitant abnormal relaxation and increased filling   pressure (grade 2 diastolic dysfunction). - Tricuspid valve: There was moderate regurgitation. - Pulmonary arteries: PA peak pressure: 31 mm Hg (S).  Epic records are reviewed at length today  Assessment and Plan:  1. Paroxysmal atrial fibrillation/typical atrial flutter S/p afib and atrial flutter ablation 11/06/19 with Dr Rayann Heman. Patient reassured that some breakthrough episodes of afib are normal in the first 3  months post ablation.  Will increase Toprol to 37.5 mg daily. Continue Eliquis 5 mg BID with no missed doses for at least 3 months post ablation.  Kardia mobile for home monitoring.   This patients CHA2DS2-VASc Score and unadjusted Ischemic Stroke Rate (% per year) is equal to 2.2 % stroke rate/year from a score of 2  Above score calculated as 1 point each if present [CHF, HTN, DM, Vascular=MI/PAD/Aortic Plaque, Age if 65-74, or Female] Above score calculated as 2 points each if present [Age > 75, or Stroke/TIA/TE]  2. Obesity Body mass index is 30.29 kg/m. Lifestyle modification was discussed and encouraged including regular physical activity and weight reduction.   Follow up in the AF clinic as scheduled.    [facility] [address] [place], [place] [address] [phone] 11/14/2019 1:13 PM

## 2019-12-02 ENCOUNTER — Telehealth: Payer: Self-pay | Admitting: Internal Medicine

## 2019-12-02 DIAGNOSIS — R918 Other nonspecific abnormal finding of lung field: Secondary | ICD-10-CM

## 2019-12-02 NOTE — Telephone Encounter (Signed)
Patient is returning phone call. Patient phone number is [phone].

## 2019-12-02 NOTE — Telephone Encounter (Signed)
LMTCB x 1 

## 2019-12-02 NOTE — Telephone Encounter (Signed)
Spoke with the pt  She states had cardiac CT 11/03/19 and new lung nodules were found  She wants Dr Melvyn Novas to review the CT (in [place]) and advise his recs on what she should do  Please advise, thanks!

## 2019-12-02 NOTE — Telephone Encounter (Signed)
Called pt and advised message from the provider. Pt understood and verbalized understanding. Nothing further is needed.   CT ordered.  

## 2019-12-02 NOTE — Telephone Encounter (Signed)
Most likely a common and  totally benign condition in  A never smoker who had a similar condition on R side  - rec f/u with me in 3-6 months with CT chest same day (if she wants see me at 3 m that's fine though it's also ok to do the 6 month interval as rec by radiology) to wrap this up but nothing needed in meantime   Dx = pulmonary infiltrates

## 2019-12-03 NOTE — Progress Notes (Signed)
Primary Care Physician: Aletha Halim., PA-C Primary Electrophysiologist: Dr Caryl Comes Referring Physician: Dr Gaylan Gerold is a 69 y.o. female with a history of paroxysmal atrial fibrillation, atrial flutter, and MVP who presents for follow up in the [facility]. Patient unfortunately had more episodes of atrial fibrillation and atrial flutter despite the higher dose of flecainide. She underwent afib and flutter ablation with Dr Rayann Heman on 11/10/19. She did visit the ED on 11/10/19 with CP and an elevated troponin which were felt to be 2/2 her ablation. She has seen several brief episodes of afib on her Kardia device.   On follow up today, patient reports doing well since her last visit. Her palpitations have become less frequent and she denies any afib in the last week. She denies CP, swallowing, or groin issues.   Today, she denies symptoms of chest pain, shortness of breath, orthopnea, PND, lower extremity edema, dizziness, presyncope, syncope, snoring, daytime somnolence, bleeding, or neurologic sequela. The patient is tolerating medications without difficulties and is otherwise without complaint today.    Atrial Fibrillation Risk Factors:  she does not have symptoms or diagnosis of sleep apnea. she does not have a history of rheumatic fever. she does not have a history of alcohol use. The patient does not have a history of early familial atrial fibrillation or other arrhythmias.  she has a BMI of Body mass index is 30.45 kg/m.Marland Kitchen Filed Weights   12/04/19 0937  Weight: 83 kg    Family History  Problem Relation Age of Onset  . Breast cancer Mother   . Breast cancer Sister      Atrial Fibrillation Management history:  Previous antiarrhythmic drugs: flecainide Previous cardioversions: none Previous ablations: 11/10/19 CHADS2VASC score: 2 (age, female) Anticoagulation history: Xarelto   Past Medical History:  Diagnosis Date  . Atrial  flutter ([place])   . Costochondritis   . Enthesopathy of hip region   . Fatigue   . Fibromyalgia   . Helicobacter pylori infection   . Insomnia   . MVP (mitral valve prolapse)    OF ANTERIOR MITRAL VALVE LEAFLET  . Obesity   . Paroxysmal atrial fibrillation ([facility])   . Pharyngitis   . Pneumonia   . Sinusitis, chronic    Past Surgical History:  Procedure Laterality Date  . APPENDECTOMY    . ATRIAL FIBRILLATION ABLATION N/A 11/06/2019   Procedure: ATRIAL FIBRILLATION ABLATION;  Surgeon: [place] Grayer, MD;  Location: [place] [facility];  Service: Cardiovascular;  Laterality: N/A;  . CARDIAC CATHETERIZATION  2010   Normal  . STRESS ECHO TEST  05/31/2010   NO ISCHEMIA  . TONSILLECTOMY    . TRANSTHORACIC ECHOCARDIOGRAM  05/16/2010   EF 55-60%    Current Outpatient Medications  Medication Sig Dispense Refill  . acetaminophen (TYLENOL 8 HOUR ARTHRITIS PAIN) 650 MG CR tablet Take 650 mg by mouth every 8 (eight) hours as needed for pain.     Marland Kitchen apixaban (ELIQUIS) 5 MG TABS tablet Take 1 tablet (5 mg total) by mouth 2 (two) times daily. 180 tablet 3  . Calcium Carbonate (CALCIUM 600 PO) Take 1 tablet by mouth 3 (three) times a week.     . calcium carbonate (TUMS - DOSED IN MG ELEMENTAL CALCIUM) 500 MG chewable tablet Chew 1 tablet by mouth as needed for indigestion or heartburn.     . Cetirizine HCl (ZYRTEC ALLERGY) 10 MG CAPS Take 5 mg by mouth at bedtime as needed (  allergies).     . cholecalciferol (VITAMIN D) 1000 UNITS tablet Take 1,000 Units by mouth every other day.     Marland Kitchen HYDROcodone-homatropine (HYCODAN) 5-1.5 MG/5ML syrup Take 5 mLs by mouth every 6 (six) hours as needed for cough.    . loratadine (CLARITIN) 10 MG tablet Take 10 mg by mouth daily as needed for allergies.    . metoprolol succinate (TOPROL-XL) 25 MG 24 hr tablet Take 1.5 tablets by mouth once a day 30 tablet 2  . multivitamin (THERAGRAN) per tablet Take 1 tablet by mouth every other day.     . pantoprazole (PROTONIX) 40  MG tablet TAKE 1 TABLET(40 MG) BY MOUTH DAILY 90 tablet 3  . Polyethyl Glycol-Propyl Glycol (SYSTANE ULTRA) 0.4-0.3 % SOLN Place 1 drop into both eyes in the morning and at bedtime.    . sodium chloride (OCEAN) 0.65 % SOLN nasal spray Place 1 spray into both nostrils as needed for congestion.    . vitamin B-12 (CYANOCOBALAMIN) 500 MCG tablet Take 500 mcg by mouth every other day.     No current facility-administered medications for this encounter.    Allergies  Allergen Reactions  . Codeine Nausea And Vomiting and Nausea Only  . Sulfur Dioxide Nausea Only  . Sulfites Nausea And Vomiting  . Codeine Phosphate Nausea And Vomiting  . Other     Prednisone cream caused blisters   . Sulfa Antibiotics Nausea And Vomiting    Social History   Socioeconomic History  . Marital status: Married    Spouse name: Not on file  . Number of children: 2  . Years of education: Not on file  . Highest education level: Not on file  Occupational History  . Occupation: Retired Tourist information centre manager  Tobacco Use  . Smoking status: Never Smoker  . Smokeless tobacco: Never Used  Substance and Sexual Activity  . Alcohol use: No  . Drug use: No  . Sexual activity: Not on file  Other Topics Concern  . Not on file  Social History Narrative   Lives in [place] with spouse.   Social Determinants of Health   Financial Resource Strain:   . Difficulty of Paying Living Expenses:   Food Insecurity:   . Worried About Charity fundraiser in the Last Year:   . Arboriculturist in the Last Year:   Transportation Needs:   . Film/video editor (Medical):   Marland Kitchen Lack of Transportation (Non-Medical):   Physical Activity:   . Days of Exercise per Week:   . Minutes of Exercise per Session:   Stress:   . Feeling of Stress :   Social Connections:   . Frequency of Communication with Friends and Family:   . Frequency of Social Gatherings with Friends and Family:   . Attends Religious Services:   . Active Member of Clubs or  Organizations:   . Attends Archivist Meetings:   Marland Kitchen Marital Status:   Intimate Partner Violence:   . Fear of Current or Ex-Partner:   . Emotionally Abused:   Marland Kitchen Physically Abused:   . Sexually Abused:      ROS- All systems are reviewed and negative except as per the HPI above.  Physical Exam: Vitals:   12/04/19 0937  BP: 108/72  Pulse: 79  Weight: 83 kg  Height: 5\' 5"  (1.651 m)    GEN- The patient is well appearing obese female, alert and oriented x 3 today.   HEENT-head normocephalic, atraumatic, sclera clear, conjunctiva  pink, hearing intact, trachea midline. Lungs- Clear to ausculation bilaterally, normal work of breathing Heart- Regular rate and rhythm, no murmurs, rubs or gallops  GI- soft, NT, ND, + BS Extremities- no clubbing, cyanosis, or edema MS- no significant deformity or atrophy Skin- no rash or lesion Psych- euthymic mood, full affect Neuro- strength and sensation are intact   Wt Readings from Last 3 Encounters:  12/04/19 83 kg  11/14/19 82.6 kg  11/06/19 83 kg    EKG today demonstrates SR HR 79, PAC, PR 136, QRS 64, QTc 408  Echo 07/19/17 demonstrated  - Left ventricle: The cavity size was normal. Wall thickness was   normal. Systolic function was normal. The estimated ejection   fraction was in the range of 60% to 65%. Wall motion was normal;   there were no regional wall motion abnormalities. Features are   consistent with a pseudonormal left ventricular filling pattern,   with concomitant abnormal relaxation and increased filling   pressure (grade 2 diastolic dysfunction). - Tricuspid valve: There was moderate regurgitation. - Pulmonary arteries: PA peak pressure: 31 mm Hg (S).  Epic records are reviewed at length today  Assessment and Plan:  1. Paroxysmal atrial fibrillation/typical atrial flutter S/p afib and atrial flutter ablation 11/06/19 with Dr Rayann Heman. Patient appears to be maintaining SR. Continue Toprol 37.5 mg  daily. Continue Eliquis 5 mg BID with no missed doses for at least 3 months post ablation.  Kardia mobile for home monitoring.   This patients CHA2DS2-VASc Score and unadjusted Ischemic Stroke Rate (% per year) is equal to 2.2 % stroke rate/year from a score of 2  Above score calculated as 1 point each if present [CHF, HTN, DM, Vascular=MI/PAD/Aortic Plaque, Age if 65-74, or Female] Above score calculated as 2 points each if present [Age > 75, or Stroke/TIA/TE]  2. Obesity Body mass index is 30.45 kg/m. Lifestyle modification was discussed and encouraged including regular physical activity and weight reduction.   Follow up with Dr Rayann Heman as scheduled.    [facility] [address] [place], [place] [address] [phone] 12/04/2019 10:01 AM

## 2019-12-04 ENCOUNTER — Encounter (HOSPITAL_COMMUNITY): Payer: Self-pay | Admitting: Physician Assistant

## 2019-12-04 ENCOUNTER — Ambulatory Visit (HOSPITAL_COMMUNITY)
Admission: RE | Admit: 2019-12-04 | Discharge: 2019-12-04 | Disposition: A | Discharge status: Home / Self Care | Payer: Medicare PPO | Source: Ambulatory Visit | Attending: Physician Assistant | Admitting: Physician Assistant

## 2019-12-04 ENCOUNTER — Ambulatory Visit (HOSPITAL_COMMUNITY): Payer: Medicare PPO | Admitting: Nurse Practitioner

## 2019-12-04 ENCOUNTER — Other Ambulatory Visit: Payer: Self-pay

## 2019-12-04 VITALS — BP 108/72 | HR 79 | Ht 65.0 in | Wt 183.0 lb

## 2019-12-04 DIAGNOSIS — Z7901 Long term (current) use of anticoagulants: Secondary | ICD-10-CM | POA: Diagnosis not present

## 2019-12-04 DIAGNOSIS — Z888 Allergy status to other drugs, medicaments and biological substances status: Secondary | ICD-10-CM | POA: Insufficient documentation

## 2019-12-04 DIAGNOSIS — Z6830 Body mass index (BMI) 30.0-30.9, adult: Secondary | ICD-10-CM | POA: Insufficient documentation

## 2019-12-04 DIAGNOSIS — Z79899 Other long term (current) drug therapy: Secondary | ICD-10-CM | POA: Insufficient documentation

## 2019-12-04 DIAGNOSIS — Z882 Allergy status to sulfonamides status: Secondary | ICD-10-CM | POA: Insufficient documentation

## 2019-12-04 DIAGNOSIS — I341 Nonrheumatic mitral (valve) prolapse: Secondary | ICD-10-CM | POA: Insufficient documentation

## 2019-12-04 DIAGNOSIS — I480 Paroxysmal atrial fibrillation: Secondary | ICD-10-CM | POA: Diagnosis not present

## 2019-12-04 DIAGNOSIS — Z885 Allergy status to narcotic agent status: Secondary | ICD-10-CM | POA: Diagnosis not present

## 2019-12-04 DIAGNOSIS — I483 Typical atrial flutter: Secondary | ICD-10-CM | POA: Insufficient documentation

## 2019-12-04 DIAGNOSIS — E669 Obesity, unspecified: Secondary | ICD-10-CM | POA: Insufficient documentation

## 2019-12-04 DIAGNOSIS — M797 Fibromyalgia: Secondary | ICD-10-CM | POA: Diagnosis not present

## 2020-02-03 ENCOUNTER — Telehealth: Payer: Self-pay

## 2020-02-03 NOTE — Telephone Encounter (Signed)
Spoke with pt regarding appt on 02/04/20. Pt stated she will check her vitals and upload an EKG. Pt confirmed virtual visit.

## 2020-02-04 ENCOUNTER — Telehealth (INDEPENDENT_AMBULATORY_CARE_PROVIDER_SITE_OTHER): Payer: Medicare PPO | Admitting: Internal Medicine

## 2020-02-04 ENCOUNTER — Telehealth (HOSPITAL_COMMUNITY): Payer: Self-pay | Admitting: Physician Assistant

## 2020-02-04 ENCOUNTER — Encounter: Payer: Self-pay | Admitting: Internal Medicine

## 2020-02-04 ENCOUNTER — Other Ambulatory Visit: Payer: Self-pay

## 2020-02-04 VITALS — BP 111/65 | Ht 65.0 in | Wt 179.0 lb

## 2020-02-04 DIAGNOSIS — D6869 Other thrombophilia: Secondary | ICD-10-CM

## 2020-02-04 DIAGNOSIS — I480 Paroxysmal atrial fibrillation: Secondary | ICD-10-CM | POA: Diagnosis not present

## 2020-02-04 DIAGNOSIS — R002 Palpitations: Secondary | ICD-10-CM | POA: Diagnosis not present

## 2020-02-04 NOTE — Progress Notes (Signed)
Electrophysiology TeleHealth Note   Due to national recommendations of social distancing due to COVID 19, an audio/video telehealth visit is felt to be most appropriate for this patient at this time.  See MyChart message from today for the patient's consent to telehealth for [name].  Date:  02/04/2020   ID:  Sheri Sims, DOB Aug 05, 1950, MRN [id number]  Location: patient's home  Provider location:  [place]  Evaluation Performed: Follow-up visit  PCP:  Aletha Halim., PA-C   Electrophysiologist:  Dr Rayann Heman  Chief Complaint:  palpitations  History of Present Illness:    Sheri Sims is a 69 y.o. female who presents via telehealth conferencing today.  Since her ablation, the patient reports doing very well.  She denies procedure related complications.  She has had some palpitations but does not think that she has had afib.  Her fibromyalgia has recently worsened and this has increased her palpitations. Today, she denies symptoms of chest pain, shortness of breath,  lower extremity edema, dizziness, presyncope, or syncope.  The patient is otherwise without complaint today.   Past Medical History:  Diagnosis Date  . Atrial flutter ([place])   . Costochondritis   . Enthesopathy of hip region   . Fatigue   . Fibromyalgia   . Helicobacter pylori infection   . Insomnia   . MVP (mitral valve prolapse)    OF ANTERIOR MITRAL VALVE LEAFLET  . Obesity   . Paroxysmal atrial fibrillation ([facility])   . Pharyngitis   . Pneumonia   . Sinusitis, chronic     Past Surgical History:  Procedure Laterality Date  . APPENDECTOMY    . ATRIAL FIBRILLATION ABLATION N/A 11/06/2019   Procedure: ATRIAL FIBRILLATION ABLATION;  Surgeon: [place] Grayer, MD;  Location: [place] [facility];  Service: Cardiovascular;  Laterality: N/A;  . CARDIAC CATHETERIZATION  2010   Normal  . STRESS ECHO TEST  05/31/2010   NO ISCHEMIA  . TONSILLECTOMY    . TRANSTHORACIC ECHOCARDIOGRAM  05/16/2010   EF  55-60%    Current Outpatient Medications  Medication Sig Dispense Refill  . acetaminophen (TYLENOL 8 HOUR ARTHRITIS PAIN) 650 MG CR tablet Take 650 mg by mouth every 8 (eight) hours as needed for pain.     Marland Kitchen apixaban (ELIQUIS) 5 MG TABS tablet Take 1 tablet (5 mg total) by mouth 2 (two) times daily. 180 tablet 3  . Calcium Carbonate (CALCIUM 600 PO) Take 1 tablet by mouth 3 (three) times a week.     . calcium carbonate (TUMS - DOSED IN MG ELEMENTAL CALCIUM) 500 MG chewable tablet Chew 1 tablet by mouth as needed for indigestion or heartburn.     . Cetirizine HCl (ZYRTEC ALLERGY) 10 MG CAPS Take 5 mg by mouth at bedtime as needed (allergies).     . cholecalciferol (VITAMIN D) 1000 UNITS tablet Take 1,000 Units by mouth every other day.     Marland Kitchen HYDROcodone-homatropine (HYCODAN) 5-1.5 MG/5ML syrup Take 5 mLs by mouth every 6 (six) hours as needed for cough.    . loratadine (CLARITIN) 10 MG tablet Take 10 mg by mouth daily as needed for allergies.    . metoprolol succinate (TOPROL-XL) 25 MG 24 hr tablet Take 1.5 tablets by mouth once a day 30 tablet 2  . multivitamin (THERAGRAN) per tablet Take 1 tablet by mouth every other day.     . pantoprazole (PROTONIX) 40 MG tablet TAKE 1 TABLET(40 MG) BY MOUTH DAILY 90 tablet 3  .  Polyethyl Glycol-Propyl Glycol (SYSTANE ULTRA) 0.4-0.3 % SOLN Place 1 drop into both eyes in the morning and at bedtime.    . sodium chloride (OCEAN) 0.65 % SOLN nasal spray Place 1 spray into both nostrils as needed for congestion.    . vitamin B-12 (CYANOCOBALAMIN) 500 MCG tablet Take 500 mcg by mouth every other day.     No current facility-administered medications for this visit.    Allergies:   Codeine, Sulfur dioxide, Sulfites, Codeine phosphate, Other, and Sulfa antibiotics   Social History:  The patient  reports that she has never smoked. She has never used smokeless tobacco. She reports that she does not drink alcohol and does not use drugs.   ROS:  Please see the  history of present illness.   All other systems are personally reviewed and negative.    Exam:    Vital Signs:  BP 111/65   Ht 5\' 5"  (1.651 m)   Wt 179 lb (81.2 kg)   BMI 29.79 kg/m   Well sounding and appearing, alert and conversant, regular work of breathing,  good skin color Eyes- anicteric, neuro- grossly intact, skin- no apparent rash or lesions or cyanosis, mouth- oral mucosa is pink  Labs/Other Tests and Data Reviewed:    Recent Labs: 11/10/2019: BUN 15; Creatinine, Ser 0.68; Hemoglobin 15.6; Magnesium 2.2; Platelets 221; Potassium 4.1; Sodium 138; TSH 1.657   Wt Readings from Last 3 Encounters:  02/04/20 179 lb (81.2 kg)  12/04/19 183 lb (83 kg)  11/14/19 182 lb (82.6 kg)    Echo 11/03/19- normal EF, mild LA enlargment, trivial MR, mild TR  ASSESSMENT & PLAN:    1.  PACs/ paroxysmal atrial fibrillation/ atrial flutter She has done very well post ablation off AAD therapy chads2vasc score is 2.  Continue eliquis for now I have advised that she can send kardia mobile strips for me to review of her recent palpitations.   No changes are planned at this time   Risks, benefits and potential toxicities for medications prescribed and/or refilled reviewed with patient today.   Follow-up:  AF clinic in 4 weeks to reassess her palpitations Follow-up with me in 4 months   Patient Risk:  after full review of this patients clinical status, I feel that they are at moderate risk at this time.  Today, I have spent 15 minutes with the patient with telehealth technology discussing arrhythmia management .    Army Fossa, MD  02/04/2020 9:48 AM     [facility] [address]  [place] [address] [phone] (office) [phone] (fax)

## 2020-02-04 NOTE — Telephone Encounter (Signed)
Called and left message for patient to call back.  Per staff message from Dr. Rayann Heman pt needs 4 wk f/u in [facility] with Adline Peals, PA.

## 2020-02-05 NOTE — Telephone Encounter (Signed)
Gracy Bruins from [facility] scheduled AF appt when she called pt about an appt with Dr. Rayann Heman.

## 2020-03-03 ENCOUNTER — Other Ambulatory Visit: Payer: Self-pay

## 2020-03-03 ENCOUNTER — Encounter (HOSPITAL_COMMUNITY): Payer: Self-pay | Admitting: Physician Assistant

## 2020-03-03 ENCOUNTER — Ambulatory Visit (HOSPITAL_COMMUNITY)
Admission: RE | Admit: 2020-03-03 | Discharge: 2020-03-03 | Disposition: A | Discharge status: Home / Self Care | Payer: Medicare PPO | Source: Ambulatory Visit | Attending: Physician Assistant | Admitting: Physician Assistant

## 2020-03-03 VITALS — BP 124/70 | HR 76 | Ht 65.0 in | Wt 179.0 lb

## 2020-03-03 DIAGNOSIS — I480 Paroxysmal atrial fibrillation: Secondary | ICD-10-CM | POA: Insufficient documentation

## 2020-03-03 DIAGNOSIS — Z79899 Other long term (current) drug therapy: Secondary | ICD-10-CM | POA: Insufficient documentation

## 2020-03-03 DIAGNOSIS — M797 Fibromyalgia: Secondary | ICD-10-CM | POA: Insufficient documentation

## 2020-03-03 DIAGNOSIS — Z7901 Long term (current) use of anticoagulants: Secondary | ICD-10-CM | POA: Insufficient documentation

## 2020-03-03 DIAGNOSIS — I4892 Unspecified atrial flutter: Secondary | ICD-10-CM | POA: Insufficient documentation

## 2020-03-03 NOTE — Progress Notes (Signed)
Primary Care Physician: Aletha Halim., PA-C Primary Electrophysiologist: Dr Caryl Comes Referring Physician: Dr Gaylan Gerold is a 69 y.o. female with a history of paroxysmal atrial fibrillation, atrial flutter, and MVP who presents for follow up in the [facility]. Patient unfortunately had more episodes of atrial fibrillation and atrial flutter despite the higher dose of flecainide. She underwent afib and flutter ablation with Dr Rayann Heman on 11/10/19. She did visit the ED on 11/10/19 with CP and an elevated troponin which were felt to be 2/2 her ablation.   On follow up today, patient reports that her palpitations have resolved. Her Kardia device has shown SR with PACs. She denies any bleeding issues on anticoagulation.   Today, she denies symptoms of palpitations, chest pain, shortness of breath, orthopnea, PND, lower extremity edema, dizziness, presyncope, syncope, snoring, daytime somnolence, bleeding, or neurologic sequela. The patient is tolerating medications without difficulties and is otherwise without complaint today.    Atrial Fibrillation Risk Factors:  she does not have symptoms or diagnosis of sleep apnea. she does not have a history of rheumatic fever. she does not have a history of alcohol use. The patient does not have a history of early familial atrial fibrillation or other arrhythmias.  she has a BMI of Body mass index is 29.79 kg/m.Marland Kitchen Filed Weights   03/03/20 1037  Weight: 81.2 kg    Family History  Problem Relation Age of Onset  . Breast cancer Mother   . Breast cancer Sister      Atrial Fibrillation Management history:  Previous antiarrhythmic drugs: flecainide Previous cardioversions: none Previous ablations: 11/10/19 CHADS2VASC score: 2 (age, female) Anticoagulation history: Xarelto, Eliquis   Past Medical History:  Diagnosis Date  . Atrial flutter ([place])   . Costochondritis   . Enthesopathy of hip region   .  Fatigue   . Fibromyalgia   . Helicobacter pylori infection   . Insomnia   . MVP (mitral valve prolapse)    OF ANTERIOR MITRAL VALVE LEAFLET  . Obesity   . Paroxysmal atrial fibrillation ([facility])   . Pharyngitis   . Pneumonia   . Sinusitis, chronic    Past Surgical History:  Procedure Laterality Date  . APPENDECTOMY    . ATRIAL FIBRILLATION ABLATION N/A 11/06/2019   Procedure: ATRIAL FIBRILLATION ABLATION;  Surgeon: [place] Grayer, MD;  Location: [place] [facility];  Service: Cardiovascular;  Laterality: N/A;  . CARDIAC CATHETERIZATION  2010   Normal  . STRESS ECHO TEST  05/31/2010   NO ISCHEMIA  . TONSILLECTOMY    . TRANSTHORACIC ECHOCARDIOGRAM  05/16/2010   EF 55-60%    Current Outpatient Medications  Medication Sig Dispense Refill  . acetaminophen (TYLENOL 8 HOUR ARTHRITIS PAIN) 650 MG CR tablet Take 650 mg by mouth every 8 (eight) hours as needed for pain.     Marland Kitchen apixaban (ELIQUIS) 5 MG TABS tablet Take 1 tablet (5 mg total) by mouth 2 (two) times daily. 180 tablet 3  . Calcium Carbonate (CALCIUM 600 PO) Take 1 tablet by mouth 3 (three) times a week.     . calcium carbonate (TUMS - DOSED IN MG ELEMENTAL CALCIUM) 500 MG chewable tablet Chew 1 tablet by mouth as needed for indigestion or heartburn.     . Cetirizine HCl (ZYRTEC ALLERGY) 10 MG CAPS Take 5 mg by mouth at bedtime as needed (allergies).     . cholecalciferol (VITAMIN D) 1000 UNITS tablet Take 1,000 Units by mouth every  other day.     Marland Kitchen HYDROcodone-homatropine (HYCODAN) 5-1.5 MG/5ML syrup Take 5 mLs by mouth every 6 (six) hours as needed for cough.    . loratadine (CLARITIN) 10 MG tablet Take 10 mg by mouth daily as needed for allergies.    . metoprolol succinate (TOPROL-XL) 25 MG 24 hr tablet Take 1.5 tablets by mouth once a day 30 tablet 2  . multivitamin (THERAGRAN) per tablet Take 1 tablet by mouth every other day.     Vladimir Faster Glycol-Propyl Glycol (SYSTANE ULTRA) 0.4-0.3 % SOLN Place 1 drop into both eyes in the  morning and at bedtime.    . sodium chloride (OCEAN) 0.65 % SOLN nasal spray Place 1 spray into both nostrils as needed for congestion.    Marland Kitchen UNABLE TO FIND viviscal    . vitamin B-12 (CYANOCOBALAMIN) 500 MCG tablet Take 500 mcg by mouth every other day.     No current facility-administered medications for this encounter.    Allergies  Allergen Reactions  . Codeine Nausea And Vomiting and Nausea Only  . Sulfur Dioxide Nausea Only  . Sulfites Nausea And Vomiting  . Codeine Phosphate Nausea And Vomiting  . Other     Prednisone cream caused blisters   . Sulfa Antibiotics Nausea And Vomiting    Social History   Socioeconomic History  . Marital status: Married    Spouse name: Not on file  . Number of children: 2  . Years of education: Not on file  . Highest education level: Not on file  Occupational History  . Occupation: Retired Tourist information centre manager  Tobacco Use  . Smoking status: Never Smoker  . Smokeless tobacco: Never Used  Vaping Use  . Vaping Use: Never used  Substance and Sexual Activity  . Alcohol use: No  . Drug use: No  . Sexual activity: Not on file  Other Topics Concern  . Not on file  Social History Narrative   Lives in [place] with spouse.   Social Determinants of Health   Financial Resource Strain:   . Difficulty of Paying Living Expenses: Not on file  Food Insecurity:   . Worried About Charity fundraiser in the Last Year: Not on file  . Ran Out of Food in the Last Year: Not on file  Transportation Needs:   . Lack of Transportation (Medical): Not on file  . Lack of Transportation (Non-Medical): Not on file  Physical Activity:   . Days of Exercise per Week: Not on file  . Minutes of Exercise per Session: Not on file  Stress:   . Feeling of Stress : Not on file  Social Connections:   . Frequency of Communication with Friends and Family: Not on file  . Frequency of Social Gatherings with Friends and Family: Not on file  . Attends Religious Services: Not on file    . Active Member of Clubs or Organizations: Not on file  . Attends Archivist Meetings: Not on file  . Marital Status: Not on file  Intimate Partner Violence:   . Fear of Current or Ex-Partner: Not on file  . Emotionally Abused: Not on file  . Physically Abused: Not on file  . Sexually Abused: Not on file     ROS- All systems are reviewed and negative except as per the HPI above.  Physical Exam: Vitals:   03/03/20 1037  BP: 124/70  Pulse: 76  Weight: 81.2 kg  Height: 5\' 5"  (1.651 m)    GEN- The patient  is well appearing, alert and oriented x 3 today.   HEENT-head normocephalic, atraumatic, sclera clear, conjunctiva pink, hearing intact, trachea midline. Lungs- Clear to ausculation bilaterally, normal work of breathing Heart- Regular rate and rhythm, occasional ectopic beat, no murmurs, rubs or gallops  GI- soft, NT, ND, + BS Extremities- no clubbing, cyanosis, or edema MS- no significant deformity or atrophy Skin- no rash or lesion Psych- euthymic mood, full affect Neuro- strength and sensation are intact   Wt Readings from Last 3 Encounters:  03/03/20 81.2 kg  02/04/20 81.2 kg  12/04/19 83 kg    EKG today demonstrates SR HR 76, PACs, PR 132, QRS 68, QTc 411  Echo 07/19/17 demonstrated  - Left ventricle: The cavity size was normal. Wall thickness was   normal. Systolic function was normal. The estimated ejection   fraction was in the range of 60% to 65%. Wall motion was normal;   there were no regional wall motion abnormalities. Features are   consistent with a pseudonormal left ventricular filling pattern,   with concomitant abnormal relaxation and increased filling   pressure (grade 2 diastolic dysfunction). - Tricuspid valve: There was moderate regurgitation. - Pulmonary arteries: PA peak pressure: 31 mm Hg (S).  Epic records are reviewed at length today  Assessment and Plan:  1. Paroxysmal atrial fibrillation/typical atrial flutter S/p afib and  atrial flutter ablation 11/06/19 with Dr Rayann Heman. Patient appears to be maintaining SR. Continue Toprol 37.5 mg daily. Continue Eliquis 5 mg BID Kardia mobile for home monitoring.   This patients CHA2DS2-VASc Score and unadjusted Ischemic Stroke Rate (% per year) is equal to 2.2 % stroke rate/year from a score of 2  Above score calculated as 1 point each if present [CHF, HTN, DM, Vascular=MI/PAD/Aortic Plaque, Age if 65-74, or Female] Above score calculated as 2 points each if present [Age > 75, or Stroke/TIA/TE]   Follow up with Dr Rayann Heman as scheduled.    [facility] [address] [place], [place] [address] [phone] 03/03/2020 11:02 AM

## 2020-03-10 ENCOUNTER — Other Ambulatory Visit: Payer: Self-pay

## 2020-03-10 MED ORDER — METOPROLOL SUCCINATE ER 25 MG PO TB24: 37.5000 mg | ORAL_TABLET | Freq: Every day | ORAL | 3 refills | Status: DC

## 2020-03-10 NOTE — Progress Notes (Signed)
Per Dr Caryl Comes OK to refill pt's Metoprolol 25mg  - Take 1.5 tablets daily for a total of 37.5mg .  Pt notified via MyChart message medication has been sent as requested.

## 2020-03-15 ENCOUNTER — Other Ambulatory Visit: Payer: Self-pay

## 2020-03-15 ENCOUNTER — Ambulatory Visit (HOSPITAL_BASED_OUTPATIENT_CLINIC_OR_DEPARTMENT_OTHER)
Admission: RE | Admit: 2020-03-15 | Discharge: 2020-03-15 | Disposition: A | Discharge status: Home / Self Care | Payer: Medicare PPO | Source: Ambulatory Visit | Attending: Internal Medicine | Admitting: Internal Medicine

## 2020-03-15 DIAGNOSIS — R918 Other nonspecific abnormal finding of lung field: Secondary | ICD-10-CM | POA: Insufficient documentation

## 2020-03-19 ENCOUNTER — Encounter: Payer: Self-pay | Admitting: Internal Medicine

## 2020-03-19 ENCOUNTER — Other Ambulatory Visit: Payer: Self-pay

## 2020-03-19 ENCOUNTER — Ambulatory Visit (INDEPENDENT_AMBULATORY_CARE_PROVIDER_SITE_OTHER): Payer: Medicare PPO | Admitting: Internal Medicine

## 2020-03-19 DIAGNOSIS — R918 Other nonspecific abnormal finding of lung field: Secondary | ICD-10-CM

## 2020-03-19 NOTE — Assessment & Plan Note (Signed)
Acute onset symptoms May 2015 ? RML syndrome    - ESR 02/13/2014 = 20 - CT chest  03/31/14 Largely wedge shaped peripheral airspace opacity in the right middle lobe most likely post pneumonic atelectasis.   - CT 07/07/2014 >  01/18/2015 no change  - CT 03/15/2016 1. Essentially stable appearance of an irregular shaped mass-like area in the lateral segment of the right middle lobe, which now demonstrates 2 years of stability. This is strongly favored to represent a benign area, likely chronic mucoid impaction within dilated distal bronchi with surrounding scarring and atelectasis.> no f/u needed unless new symptoms - CTa 10/06/16 1.No pulmonary embolism. 2.Focal consolidation in the right upper lobe most likely pneumonia. 3.Slightly decreased size of a masslike opacity in the right middle lobe compared to CT from 2015. This is favored to represent sequela of prior infection.  - HRCT  10/27/2016  Lesion is well demonstrated on coronal and sagittal reconstructions and when comparing to 03/31/2014, the lesion measures 1.2 x 1.8 x 2.4 cm today compared to 1.5 x 2.2 x 2.6 cm on 03/31/2014.  RUL process has completely resolved c/w CAP  - recurrent R chest wall pain 05/01/2018 > rx empirically with 10  Days augmentin - CT chest 03/15/20  1. Clustered solid pulmonary nodules in a branching configuration in the posterior right lower lobe, new in the short interval since 11/03/2019 CT, probably infectious/inflammatory. Follow-up chest CT recommended in 3-6 months. 2. Previously described clustered solid nodules in the medial left lower lobe are decreased since 11/03/2019 CT, compatible with resolving benign inflammatory nodularity.   This is an extremely common benign condition in the elderly and does not warrant aggressive eval/ rx at this point unless there is a clinical correlation suggesting unaddressed pulmonary infection (purulent sputum, night sweats, unintended wt loss, doe) or evolution of   obvious changes on plain cxr (as opposed to serial CT, which is way over sensitive to make clinical decisions re intervention and treatment in the elderly, who tend to tolerate both dx and treatment poorly) .   Discussed in detail all the  indications, usual  risks and alternatives  relative to the benefits with patient who agrees to proceed with conservative f/u as outlined     >>> rec f/u cxr in 6 months/ call sooner if new symptoms

## 2020-03-19 NOTE — Progress Notes (Signed)
Subjective:   Patient ID: Sheri Sims, female    DOB: [birth year]   MRN: [id number]    Brief patient profile:  69 yowf never smoker bad pna age 69 but did fine thereafter except for seasonal allergies responding to otcs with abrupt R side pleuritic cp/ chills Nov 03 2013 > [facility] > dx RML pna admit x 2 days > complete resolution of R pain, chills and fever resolved but persistent fatigue and infiltrates RML gradually improving  But still present on  cxr 01/30/14 so referred to pulmonary clinic 02/13/2014 by Dr Kathryne Eriksson    History of Present Illness  02/13/2014 1st [facility] office visit/ Naheim Burgen  ? RML syndrome Chief Complaint  Patient presents with  . Pulmonary Consult    Referred per Dr. Kathryne Eriksson for eval of abn cxr. Pt states that she was dxed with PNA 4 months ago.  She c/o SOB with walking up and down stairs and if she bends over alot.   overall pattern was very abrupt onset of classic R lat pleuritic cp and much better by the time she was discharged and slow but steady improvement  in all symptoms since then . Never had hemoptysis or purulent sputum - more fatigue limiting activity than sob.  rec No change rx   03/24/2014 f/u ov/Christohper Dube re: f/u ? RML mass vs RML syndrome  Chief Complaint  Patient presents with  . Followup with CXR    Pt states that she is doing well today and denies any new co's.   Not limited by breathing from desired activities   rec - CT chest  03/31/14 Largely wedge shaped peripheral airspace opacity in the right middle lobe most likely post pneumonic atelectasis.   - CT 07/07/2014 >  01/18/2015 no change  - CT 03/15/2016 1. Essentially stable appearance of an irregular shaped mass-like area in the lateral segment of the right middle lobe, which now demonstrates 2 years of stability. This is strongly favored to represent a benign area, likely chronic mucoid impaction within dilated distal bronchi with surrounding scarring and atelectasis.> no  f/u needed unless new symptoms    10/12/2016  f/u ov/Jeniyah Menor re: re-establish re RML infiltrates  New location plus prev residual changes  Chief Complaint  Patient presents with  . Acute Visit    last seen 03/2014- pt seen last week by [facility] for PNA. Concern by pt for pleurisy recurrance.   100% prev symptoms resolved , then acutely ill on mid march 2018 sore throat / fever / stuffy nose with yellow discharge / then cough esp in am dark yellow > over about 10 days improved  > minimal residual cough never received abx  Then acutely new location (from prev presumed pna) R pleuritic  cp near previous location then CTa  10/06/16  Hanley Seamen showed same are smaller plus new area on pna > rx IV abx /levaquin x 10 days planned new Lie L side down hurts on R - overall pain only sltly better on abx than at its peak 10/06/16 and no longer hurts to breath deeply  Taking advil 200 up to one daily only / has stronger pain pill but not taking and doesn't know the name rec Finish your antibiotics as planned Take advil up to 3 with meals as needed for pain with breathing or coughing or different positions Please see patient coordinator before you leave today  to schedule CT with contrast in one month and ov same day  11/15/2016  f/u ov/Naylah Cork re: RML syndrome/ fibromyalgia  Chief Complaint  Patient presents with  . Follow-up    Discuss CT Chest. She only c/o occ cough. No new co's today.   Not limited by breathing from desired activities/ cough is minimal dry    rec F/u prn     09/03/2017  f/u ov/Tesha Archambeau re:  RML syndrome/ fibromyalgia / transient pleuritic cp/ same location as before/ pt on xarelto Chief Complaint  Patient presents with  . Follow-up    had a flare of cough in the Fall of 2018 and then again feb 2019- cxr was done and she is concerned about "spot".  She states that she has been having some pain in her teeth and PND.   Dyspnea:  Limited my fatigue not cp/sob Cough: no Sleep: no resp issues   Did have some discomfort R lateral and respond  advil in fall 2018 and then again similar pain pred/abx  Tooth pain above c/w chronic TMJ already eval and has fibromyalgia as well but the pain in the R lateral c/w is classic pleuritic, localized, same as in past and worse lying down s assoc sob and better since rx as  CAP rec No change rx   05/01/2018 acute extended ov/Cydni Reddoch re: ? Flare of rml syndrome vs fibromyalgia  Chief Complaint  Patient presents with  . Acute Visit    woke up right side back pain "a little higher than the pleurisy spot".  She states it hurts to take a deep breath. She has had some cough "with a raspy sound"- prod with very little sputum and she is unsure of color.    chronic/  recurrent pain R cw pain sev times x a week always goes away after   p tylenol while maint on xarelto for afib.   Then new slt more superior R cw  pain am of ov assoc with increased  cough since 04/27/18 min prod s fever or sob . rec Augmentin 875 mg take one pill twice daily  X 10 days - take at breakfast and supper with large glass of water.  It would help reduce the usual side effects (diarrhea and yeast infections) if you ate cultured yogurt at lunch.  For cough > hydromet 1 tsp every 4 hours as needed > never took it (husband took it)  Call if not 100% better in 2 weeks      08/19/2018 acute extended ov/Keon Pender re: acute cough ? Flare of RML syndrome  Chief Complaint  Patient presents with  . Acute Visit    Pt c/o cough since 08/09/2018. Cough is currently prod with green to yellow sputum. She also c/o runny nose and sneezin.  husband was sick then 2/7 pt noted acute onset sore throat cough burning/ raw sensation over ant chest exp when out in cold  Only sob with cough Cough worse in evenings and with exp to  Cold air/   But sleeps ok / worse first thing in am rec Augmentin 875 mg take one pill twice daily  X 10 days If not better >   Prednisone 10 mg take  4 each am x 2 days,   2 each am x  2 days,  1 each am x 2 days and stop  GERD (REFLUX)   For cough > hycodan up to a tsp every 4 hours as needed       Virtual Visit via Telephone Note 03/19/2020   I connected with [name]  on 03/19/20 at 10:15 AM EDT by telephone and verified that I am speaking with the correct person using two identifiers. Pt is at home and this call made from my office with no other participants    I discussed the limitations, risks, security and privacy concerns of performing an evaluation and management service by telephone and the availability of in person appointments. I also discussed with the patient that there may be a patient responsible charge related to this service. The patient expressed understanding and agreed to proceed.   History of Present Illness: Better over the last year on gerd rx / still some sinus problems  Dyspnea:  Not limited by breathing from desired activities   Cough: none  Sleeping: able to lie flat bed/ one pillow  SABA use: none 02: none  occ twinges of cp in exact same area as in 2015 - fleeting and no other cp's    No obvious day to day or daytime variability or assoc excess/ purulent sputum or mucus plugs or hemoptysis or cp or chest tightness, subjective wheeze or overt  hb symptoms.    Also denies any obvious fluctuation of symptoms with weather or environmental changes or other aggravating or alleviating factors except as outlined above.   Meds reviewed/ med reconciliation completed     No outpatient medications have been marked as taking for the 03/19/20 encounter (Appointment) with Tanda Rockers, MD.         Observations/Objective: Speaking full sentences/ good voice texture/ no spont      - CT chest 03/15/20  1. Clustered solid pulmonary nodules in a branching configuration in the posterior right lower lobe, new in the short interval since 11/03/2019 CT, probably infectious/inflammatory. Follow-up chest CT recommended in 3-6 months. 2.  Previously described clustered solid nodules in the medial left lower lobe are decreased since 11/03/2019 CT, compatible with resolving benign inflammatory nodularity.    Assessment and Plan: See problem list for active a/p's   Follow Up Instructions: See avs for instructions unique to this ov which includes revised/ updated med list     I discussed the assessment and treatment plan with the patient. The patient was provided an opportunity to ask questions and all were answered. The patient agreed with the plan and demonstrated an understanding of the instructions.   The patient was advised to call back or seek an in-person evaluation if the symptoms worsen or if the condition fails to improve as anticipated.  I provided 25 minutes of non-face-to-face time during this encounter.   Christinia Gully, MD

## 2020-03-19 NOTE — Patient Instructions (Addendum)
No change in recommendations  Please schedule a follow up visit in 6 months but call sooner if needed  - cxr on return

## 2020-03-20 ENCOUNTER — Other Ambulatory Visit: Payer: Self-pay | Admitting: Internal Medicine

## 2020-04-27 ENCOUNTER — Telehealth: Payer: Self-pay | Admitting: Internal Medicine

## 2020-04-27 NOTE — Telephone Encounter (Signed)
Pt c/o medication issue:  1. Name of Medication: apixaban (ELIQUIS) 5 MG TABS tablet  2. How are you currently taking this medication (dosage and times per day)? As directed  3. Are you having a reaction (difficulty breathing--STAT)? yes  4. What is your medication issue? Patient states that she developed a rash months ago. She has been seeing a skin specialist and they ran blood work. 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 Her skin specialist and her started going through her list of medications and concluded that around the time she switched from Xarelto to Eliquis, a few weeks later she started developing these symptoms. She wants to know if Dr. Caryl Comes would be OK with her going back to Xarelto.

## 2020-04-27 NOTE — Telephone Encounter (Signed)
Please see pts emails.  Pt is requesting a refill of Hydromet as her current script has expired.  This was last filled on 08/19/18 by Dr. Melvyn Novas for #240 with 0 refills.  Last seen on 03/19/2020 for pulmonary infiltrates and advised to follow up in 6 months.   Dr. Melvyn Novas, please advise. Thanks.

## 2020-04-28 NOTE — Telephone Encounter (Signed)
Since it is a narcotic we will need to see her to prescribe it,especially since it has been well over a year since last contact.  If she has been seeing Bing Matter recently she may be willing to call it in.  In the meantime, the best otc is delsym 2tsp bid and non mint/menthol hard rock candy to avoid throat clearing from post nasal drainage.

## 2020-04-30 ENCOUNTER — Encounter: Payer: Self-pay | Admitting: Pharmacist

## 2020-04-30 ENCOUNTER — Other Ambulatory Visit: Payer: Self-pay | Admitting: Pharmacist

## 2020-04-30 MED ORDER — RIVAROXABAN 20 MG PO TABS
20.0000 mg | ORAL_TABLET | Freq: Every day | ORAL | 5 refills | Status: DC
Start: 2020-04-30 — End: 2020-07-21

## 2020-05-01 NOTE — Telephone Encounter (Signed)
Of course ok to switch back

## 2020-05-04 NOTE — Telephone Encounter (Signed)
Spoke with pt and advised per Dr Caryl Comes ok to go back to Xarelto 20mg  1 tablet daily by mouth at dinner.  Pt states she received Xarelto refill through MyChart and restarted today.  Pt with no further questions or concerns and thanked Therapist, sports for callback.

## 2020-05-05 DIAGNOSIS — M5136 Other intervertebral disc degeneration, lumbar region: Secondary | ICD-10-CM | POA: Diagnosis not present

## 2020-05-05 DIAGNOSIS — M4805 Spinal stenosis, thoracolumbar region: Secondary | ICD-10-CM | POA: Diagnosis not present

## 2020-05-05 DIAGNOSIS — M23221 Derangement of posterior horn of medial meniscus due to old tear or injury, right knee: Secondary | ICD-10-CM | POA: Diagnosis not present

## 2020-05-10 DIAGNOSIS — I4891 Unspecified atrial fibrillation: Secondary | ICD-10-CM | POA: Diagnosis not present

## 2020-05-10 DIAGNOSIS — R739 Hyperglycemia, unspecified: Secondary | ICD-10-CM | POA: Diagnosis not present

## 2020-05-10 DIAGNOSIS — E785 Hyperlipidemia, unspecified: Secondary | ICD-10-CM | POA: Diagnosis not present

## 2020-05-12 DIAGNOSIS — M4805 Spinal stenosis, thoracolumbar region: Secondary | ICD-10-CM | POA: Diagnosis not present

## 2020-05-12 DIAGNOSIS — M5136 Other intervertebral disc degeneration, lumbar region: Secondary | ICD-10-CM | POA: Diagnosis not present

## 2020-05-12 DIAGNOSIS — M23221 Derangement of posterior horn of medial meniscus due to old tear or injury, right knee: Secondary | ICD-10-CM | POA: Diagnosis not present

## 2020-05-17 DIAGNOSIS — I480 Paroxysmal atrial fibrillation: Secondary | ICD-10-CM | POA: Diagnosis not present

## 2020-05-17 DIAGNOSIS — Z0000 Encounter for general adult medical examination without abnormal findings: Secondary | ICD-10-CM | POA: Diagnosis not present

## 2020-05-17 DIAGNOSIS — Z1231 Encounter for screening mammogram for malignant neoplasm of breast: Secondary | ICD-10-CM | POA: Diagnosis not present

## 2020-05-17 DIAGNOSIS — E785 Hyperlipidemia, unspecified: Secondary | ICD-10-CM | POA: Diagnosis not present

## 2020-05-17 DIAGNOSIS — R635 Abnormal weight gain: Secondary | ICD-10-CM | POA: Diagnosis not present

## 2020-05-21 DIAGNOSIS — M4805 Spinal stenosis, thoracolumbar region: Secondary | ICD-10-CM | POA: Diagnosis not present

## 2020-05-21 DIAGNOSIS — M5136 Other intervertebral disc degeneration, lumbar region: Secondary | ICD-10-CM | POA: Diagnosis not present

## 2020-05-21 DIAGNOSIS — M23221 Derangement of posterior horn of medial meniscus due to old tear or injury, right knee: Secondary | ICD-10-CM | POA: Diagnosis not present

## 2020-06-07 ENCOUNTER — Encounter: Payer: Self-pay | Admitting: Internal Medicine

## 2020-06-07 ENCOUNTER — Ambulatory Visit: Payer: Medicare PPO | Admitting: Internal Medicine

## 2020-06-07 ENCOUNTER — Other Ambulatory Visit: Payer: Self-pay

## 2020-06-07 VITALS — BP 112/66 | HR 75 | Ht 65.0 in | Wt 184.5 lb

## 2020-06-07 DIAGNOSIS — R002 Palpitations: Secondary | ICD-10-CM

## 2020-06-07 DIAGNOSIS — I483 Typical atrial flutter: Secondary | ICD-10-CM | POA: Diagnosis not present

## 2020-06-07 DIAGNOSIS — D6869 Other thrombophilia: Secondary | ICD-10-CM | POA: Diagnosis not present

## 2020-06-07 DIAGNOSIS — I480 Paroxysmal atrial fibrillation: Secondary | ICD-10-CM

## 2020-06-07 NOTE — Progress Notes (Signed)
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  Since last being seen in our clinic, the patient reports doing very well.  Today, she denies symptoms of palpitations, chest pain, shortness of breath,  lower extremity edema, dizziness, presyncope, or syncope.  The patient is otherwise without complaint today.   Past Medical History:  Diagnosis Date  . Atrial flutter ([facility])   . Costochondritis   . Enthesopathy of hip region   . Fatigue   . Fibromyalgia   . Helicobacter pylori infection   . Insomnia   . MVP (mitral valve prolapse)    OF ANTERIOR MITRAL VALVE LEAFLET  . Obesity   . Paroxysmal atrial fibrillation ([facility])   . Pharyngitis   . Pneumonia   . Sinusitis, chronic    Past Surgical History:  Procedure Laterality Date  . APPENDECTOMY    . ATRIAL FIBRILLATION ABLATION N/A 11/06/2019   Procedure: ATRIAL FIBRILLATION ABLATION;  Surgeon: Thompson Grayer, MD;  Location: [facility];  Service: Cardiovascular;  Laterality: N/A;  . CARDIAC CATHETERIZATION  2010   Normal  . STRESS ECHO TEST  05/31/2010   NO ISCHEMIA  . TONSILLECTOMY    . TRANSTHORACIC ECHOCARDIOGRAM  05/16/2010   EF 55-60%    ROS- all systems are reviewed and negatives except as per HPI above  Current Outpatient Medications  Medication Sig Dispense Refill  . acetaminophen (TYLENOL 8 HOUR ARTHRITIS PAIN) 650 MG CR tablet Take 650 mg by mouth every 8 (eight) hours as needed for pain.     . Calcium Carbonate (CALCIUM 600 PO) Take 1 tablet by mouth 3 (three) times a week.     . calcium carbonate (TUMS - DOSED IN MG ELEMENTAL CALCIUM) 500 MG chewable tablet Chew 1 tablet by mouth as needed for indigestion or heartburn.     . Cetirizine HCl (ZYRTEC ALLERGY) 10 MG CAPS Take 5 mg by mouth at bedtime as needed (allergies).     . cholecalciferol (VITAMIN D) 1000 UNITS tablet Take 1,000 Units by mouth every other day.      Marland Kitchen HYDROcodone-homatropine (HYCODAN) 5-1.5 MG/5ML syrup Take 5 mLs by mouth every 6 (six) hours as needed for cough.    . loratadine (CLARITIN) 10 MG tablet Take 10 mg by mouth daily as needed for allergies.    . metoprolol succinate (TOPROL-XL) 25 MG 24 hr tablet Take 1.5 tablets (37.5 mg total) by mouth daily. 135 tablet 3  . multivitamin (THERAGRAN) per tablet Take 1 tablet by mouth every other day.     Vladimir Faster Glycol-Propyl Glycol (SYSTANE ULTRA) 0.4-0.3 % SOLN Place 1 drop into both eyes in the morning and at bedtime.    . rivaroxaban (XARELTO) 20 MG TABS tablet Take 1 tablet (20 mg total) by mouth daily with supper. 30 tablet 5  . sodium chloride (OCEAN) 0.65 % SOLN nasal spray Place 1 spray into both nostrils as needed for congestion.    . vitamin B-12 (CYANOCOBALAMIN) 500 MCG tablet Take 500 mcg by mouth every other day.     No current facility-administered medications for this visit.    Physical Exam: Vitals:   06/07/20 1057  BP: 112/66  Pulse: 75  SpO2: 94%  Weight: 184 lb 8 oz (83.7 kg)  Height: 5\' 5"  (1.651 m)    GEN- The patient is well appearing, alert and oriented x 3 today.   Head- normocephalic,  atraumatic Eyes-  Sclera clear, conjunctiva pink Ears- hearing intact Oropharynx- clear Lungs- Clear to ausculation bilaterally, normal work of breathing Heart- Regular rate and rhythm, no murmurs, rubs or gallops, PMI not laterally displaced GI- soft, NT, ND, + BS Extremities- no clubbing, cyanosis, or edema  Wt Readings from Last 3 Encounters:  06/07/20 184 lb 8 oz (83.7 kg)  03/03/20 179 lb (81.2 kg)  02/04/20 179 lb (81.2 kg)    EKG tracing ordered today is personally reviewed and shows sinus with PACs  Assessment and Plan:  1. Paroxysmal atrial fibrillation/ atrial flutter/ PACs Doing well post ablation off AAD therapy chads2vasc score is 2.  She is on xarelto.  She had a rash to eliquis she thinks.  Doing better with xarelto We discussed that  guidelines would support her stopping anticoagulation.  She is reluctant to change tocday  Risks, benefits and potential toxicities for medications prescribed and/or refilled reviewed with patient today.   Return to see Dr Caryl Comes for annual visit in march.  I will see 03/2021.  Thompson Grayer MD, [facility] 06/07/2020 11:08 AM

## 2020-06-07 NOTE — Patient Instructions (Addendum)
Medication Instructions:  Your physician recommends that you continue on your current medications as directed. Please refer to the Current Medication list given to you today.  *If you need a refill on your cardiac medications before your next appointment, please call your pharmacy*  Lab Work: None ordered.  If you have labs (blood work) drawn today and your tests are completely normal, you will receive your results only by: Marland Kitchen MyChart Message (if you have MyChart) OR . A paper copy in the mail If you have any lab test that is abnormal or we need to change your treatment, we will call you to review the results.  Testing/Procedures: None ordered.  Follow-Up: At [facility], you and your health needs are our priority.  As part of our continuing mission to provide you with exceptional heart care, we have created designated Provider Care Teams.  These Care Teams include your primary Cardiologist (physician) and Advanced Practice Providers (APPs -  Physician Assistants and Nurse Practitioners) who all work together to provide you with the care you need, when you need it.  We recommend signing up for the patient portal called "MyChart".  Sign up information is provided on this After Visit Summary.  MyChart is used to connect with patients for Virtual Visits (Telemedicine).  Patients are able to view lab/test results, encounter notes, upcoming appointments, etc.  Non-urgent messages can be sent to your provider as well.   To learn more about what you can do with MyChart, go to [email or web address].    Your next appointment:   Your physician wants you to follow-up in: 03/14/21 at 10:45 am with Dr. Rayann Heman.    Other Instructions:

## 2020-06-08 DIAGNOSIS — Z1231 Encounter for screening mammogram for malignant neoplasm of breast: Secondary | ICD-10-CM | POA: Diagnosis not present

## 2020-06-23 ENCOUNTER — Telehealth: Payer: Self-pay | Admitting: Internal Medicine

## 2020-06-23 DIAGNOSIS — R922 Inconclusive mammogram: Secondary | ICD-10-CM | POA: Diagnosis not present

## 2020-06-23 DIAGNOSIS — R928 Other abnormal and inconclusive findings on diagnostic imaging of breast: Secondary | ICD-10-CM | POA: Diagnosis not present

## 2020-06-23 NOTE — Telephone Encounter (Signed)
Patient with diagnosis of afib on Xarelto for anticoagulation.    Procedure: Needle core breast Biopsy    Date of procedure: 07/08/20  CHA2DS2-VASc Score = 2  This indicates a 2.2% annual risk of stroke. The patient's score is based upon: CHF History: No HTN History: No Diabetes History: No Stroke History: No Vascular Disease History: No Age Score: 1 Gender Score: 1    CrCl 143mL/min Platelet count 221K  Per office protocol, patient can hold Xarelto for 3 days prior to procedure as requested since pt is at low cardiovascular risk off of anticoagulation.

## 2020-06-23 NOTE — Telephone Encounter (Signed)
   Primary Cardiologist: Virl Axe, MD  Chart reviewed as part of pre-operative protocol coverage. Given past medical history and time since last visit, based on ACC/AHA guidelines, Sheri Sims would be at acceptable risk for the planned procedure without further cardiovascular testing.   Patient with diagnosis of afib on Xarelto for anticoagulation.    Procedure: Needle core breast Biopsy  Date of procedure: 07/08/20  CHA2DS2-VASc Score = 2  This indicates a 2.2% annual risk of stroke. The patient's score is based upon: CHF History: No HTN History: No Diabetes History: No Stroke History: No Vascular Disease History: No Age Score: 1 Gender Score: 1  CrCl 170mL/min Platelet count 221K  Per office protocol, patient can hold Xarelto for 3 days prior to procedure as requested since pt is at low cardiovascular risk off of anticoagulation.  I will route this recommendation to the requesting party via Epic fax function and remove from pre-op pool.  Please call with questions.  [name]. [name] NP-C    06/23/2020, 1:10 PM [address] Office [phone] Fax [phone]

## 2020-06-23 NOTE — Telephone Encounter (Signed)
° °  [facility] Pre-operative Risk Assessment    HEARTCARE STAFF: - Please ensure there is not already an duplicate clearance open for this procedure. - Under Visit Info/Reason for Call, type in Other and utilize the format Clearance MM/DD/YY or Clearance TBD. Do not use dashes or single digits. - If request is for dental extraction, please clarify the # of teeth to be extracted.  Request for surgical clearance:  1. What type of surgery is being performed?  Needle core breast Biopsy    2. When is this surgery scheduled? 07/08/20   3. What type of clearance is required (medical clearance vs. Pharmacy clearance to hold med vs. Both)? Pharmacy   Are there any medications that need to be held prior to surgery and how long?  rivaroxaban (XARELTO) 20 MG TABS tablet [[id number]]  Hold 3 or 4 days prior to  4. Practice name and name of physician performing surgery?  [facility]  5. What is the office phone number? [phone]   7.   What is the office fax number? [phone]  8.   Anesthesia type (None, local, MAC, general) ? Local    Sheri Sims 06/23/2020, 12:43 PM  _________________________________________________________________   (provider comments below)

## 2020-07-08 DIAGNOSIS — Z170 Estrogen receptor positive status [ER+]: Secondary | ICD-10-CM | POA: Diagnosis not present

## 2020-07-08 DIAGNOSIS — C50411 Malignant neoplasm of upper-outer quadrant of right female breast: Secondary | ICD-10-CM | POA: Diagnosis not present

## 2020-07-08 DIAGNOSIS — N6091 Unspecified benign mammary dysplasia of right breast: Secondary | ICD-10-CM | POA: Diagnosis not present

## 2020-07-08 DIAGNOSIS — R928 Other abnormal and inconclusive findings on diagnostic imaging of breast: Secondary | ICD-10-CM | POA: Diagnosis not present

## 2020-07-12 ENCOUNTER — Telehealth: Payer: Self-pay | Admitting: Hematology and Oncology

## 2020-07-12 ENCOUNTER — Telehealth: Payer: Self-pay | Admitting: Internal Medicine

## 2020-07-12 DIAGNOSIS — H353211 Exudative age-related macular degeneration, right eye, with active choroidal neovascularization: Secondary | ICD-10-CM | POA: Diagnosis not present

## 2020-07-12 DIAGNOSIS — H35362 Drusen (degenerative) of macula, left eye: Secondary | ICD-10-CM | POA: Diagnosis not present

## 2020-07-12 DIAGNOSIS — H2511 Age-related nuclear cataract, right eye: Secondary | ICD-10-CM | POA: Diagnosis not present

## 2020-07-12 MED ORDER — HYDROCODONE-HOMATROPINE 5-1.5 MG/5ML PO SYRP: 5.0000 mL | ORAL_SOLUTION | Freq: Four times a day (QID) | ORAL | 0 refills | Status: DC | PRN

## 2020-07-12 NOTE — Telephone Encounter (Signed)
Patient is returning phone call. Patient phone number is [phone].

## 2020-07-12 NOTE — Telephone Encounter (Signed)
Spoke to patient to confirm morning [facility] appointment for 1/19, [facility] will send packet to patient

## 2020-07-12 NOTE — Telephone Encounter (Signed)
Sheri Sims

## 2020-07-12 NOTE — Telephone Encounter (Signed)
Spoke with pt and advised of Dr Gustavus Bryant recommendations and that rx was sent to pharmacy. Pt verbalized understanding. Nothing further needed.

## 2020-07-12 NOTE — Telephone Encounter (Signed)
Spoke with pt. She was recently dx with Breast cancer in the right breast. She has an appt to see oncology next week. She remembered Dr Melvyn Novas had wanted to do a cxr around 08/2020 to f/u on nodules on the right lung. Pt wants to know if Dr Melvyn Novas would want to do the cxr or possibly a CT now due to her new Breast cancer dx. Also pt is requesting a refill for Hydromet cough syrup that Dr Melvyn Novas prescribed for her 2 years ago. Taking that occas along with Claritin has helped pt a lot.  Pt advised that Dr wert is out of the office now and will return tomorrow. Pt is ok with a call back tomorrow.  Please advise.

## 2020-07-12 NOTE — Telephone Encounter (Signed)
Sorry to hear that but let oncology decide re the other nodules   I efilled hydormet x one and further refills will need ov or pcp or oncology to take over as can't continue to refill

## 2020-07-14 ENCOUNTER — Encounter: Payer: Self-pay | Admitting: *Deleted

## 2020-07-14 DIAGNOSIS — H35362 Drusen (degenerative) of macula, left eye: Secondary | ICD-10-CM | POA: Diagnosis not present

## 2020-07-14 DIAGNOSIS — H353211 Exudative age-related macular degeneration, right eye, with active choroidal neovascularization: Secondary | ICD-10-CM | POA: Diagnosis not present

## 2020-07-15 ENCOUNTER — Other Ambulatory Visit: Payer: Self-pay | Admitting: *Deleted

## 2020-07-15 ENCOUNTER — Encounter: Payer: Self-pay | Admitting: *Deleted

## 2020-07-15 DIAGNOSIS — Z170 Estrogen receptor positive status [ER+]: Secondary | ICD-10-CM

## 2020-07-15 DIAGNOSIS — C50411 Malignant neoplasm of upper-outer quadrant of right female breast: Secondary | ICD-10-CM

## 2020-07-16 ENCOUNTER — Encounter: Payer: Self-pay | Admitting: Family Medicine

## 2020-07-20 DIAGNOSIS — Z170 Estrogen receptor positive status [ER+]: Secondary | ICD-10-CM | POA: Diagnosis not present

## 2020-07-20 DIAGNOSIS — C50811 Malignant neoplasm of overlapping sites of right female breast: Secondary | ICD-10-CM | POA: Diagnosis not present

## 2020-07-20 DIAGNOSIS — R928 Other abnormal and inconclusive findings on diagnostic imaging of breast: Secondary | ICD-10-CM | POA: Diagnosis not present

## 2020-07-20 NOTE — Progress Notes (Signed)
[facility] NOTE  Patient Care Team: Aletha Halim., PA-C as PCP - General (Family Medicine) Deboraha Sprang, MD as PCP - Cardiology (Cardiology) Mauro Kaufmann, RN as Oncology Nurse Navigator Rockwell Germany, RN as Oncology Nurse Navigator Nicholas Lose, MD as Consulting Physician (Hematology and Oncology) Coralie Keens, MD as Consulting Physician (General Surgery) Gery Pray, MD as Consulting Physician (Radiation Oncology)  CHIEF COMPLAINTS/PURPOSE OF CONSULTATION:  Newly diagnosed breast cancer  HISTORY OF PRESENTING ILLNESS:  Sheri Sims 70 y.o. female is here because of recent diagnosis of right breast cancer. Screening mammogram on 06/08/20 showed calcifications in the upper inner right breast and a 1.5cm architectural distortion. Diagnostic mammogram and [place] on 06/23/20 showed a 1.0cm upper outer right breast mass and a 0.7cm irregular area central to the nipple. She presents to the clinic today for initial evaluation and discussion of treatment options.   I reviewed her records extensively and collaborated the history with the patient.  SUMMARY OF ONCOLOGIC HISTORY: Oncology History  Malignant neoplasm of upper-outer quadrant of right breast in female, estrogen receptor positive ([place])  07/15/2020 Initial Diagnosis   Screening mammogram showed calcifications in the upper inner right breast and a 1.5cm architectural distortion. Diagnostic mammogram and US showed a 1.0cm upper outer right breast mass and a 0.7cm irregular area central to the nipple.  ER 95%, PR 95%, HER2 negative, Ki-67 10%   07/21/2020 Cancer Staging   Staging form: Breast, AJCC 8th Edition - Clinical stage from 07/21/2020: Stage IA (cT1b, cN0, cM0, G3, ER+, PR+, HER2-) - Signed by Nicholas Lose, MD on 07/21/2020     MEDICAL HISTORY:  Past Medical History:  Diagnosis Date  . Atrial flutter ([place])   . Costochondritis   . Enthesopathy of hip region   . Fatigue   . Fibromyalgia    . Helicobacter pylori infection   . Insomnia   . MVP (mitral valve prolapse)    OF ANTERIOR MITRAL VALVE LEAFLET  . Obesity   . Paroxysmal atrial fibrillation ([facility])   . Pharyngitis   . Pneumonia   . Sinusitis, chronic     SURGICAL HISTORY: Past Surgical History:  Procedure Laterality Date  . APPENDECTOMY    . ATRIAL FIBRILLATION ABLATION N/A 11/06/2019   Procedure: ATRIAL FIBRILLATION ABLATION;  Surgeon: Thompson Grayer, MD;  Location: [facility];  Service: Cardiovascular;  Laterality: N/A;  . CARDIAC CATHETERIZATION  2010   Normal  . STRESS ECHO TEST  05/31/2010   NO ISCHEMIA  . TONSILLECTOMY    . TRANSTHORACIC ECHOCARDIOGRAM  05/16/2010   EF 55-60%    SOCIAL HISTORY: Social History   Socioeconomic History  . Marital status: Married    Spouse name: Not on file  . Number of children: 2  . Years of education: Not on file  . Highest education level: Not on file  Occupational History  . Occupation: Retired Tourist information centre manager  Tobacco Use  . Smoking status: Never Smoker  . Smokeless tobacco: Never Used  Vaping Use  . Vaping Use: Never used  Substance and Sexual Activity  . Alcohol use: No  . Drug use: No  . Sexual activity: Not on file  Other Topics Concern  . Not on file  Social History Narrative   Lives in [place] with spouse.   Social Determinants of Health   Financial Resource Strain: Not on file  Food Insecurity: Not on file  Transportation Needs: Not on file  Physical Activity: Not on file  Stress: Not on file  Social Connections: Not on file  Intimate Partner Violence: Not on file    FAMILY HISTORY: Family History  Problem Relation Age of Onset  . Breast cancer Mother 70  . Breast cancer Sister 62  . Cancer Maternal Grandmother        unknown type; dx 31    ALLERGIES:  is allergic to codeine, sulfur dioxide, sulfites, eliquis [apixaban], codeine phosphate, other, and sulfa antibiotics.  MEDICATIONS:  Current Outpatient Medications  Medication  Sig Dispense Refill  . acetaminophen (TYLENOL) 650 MG CR tablet Take 650 mg by mouth every 8 (eight) hours as needed for pain.     Marland Kitchen apixaban (ELIQUIS) 5 MG TABS tablet Take 5 mg by mouth 2 (two) times daily.    . Calcium Carbonate (CALCIUM 600 PO) Take 1 tablet by mouth 3 (three) times a week.     . calcium carbonate (TUMS - DOSED IN MG ELEMENTAL CALCIUM) 500 MG chewable tablet Chew 1 tablet by mouth as needed for indigestion or heartburn.     . Cetirizine HCl 10 MG CAPS Take 5 mg by mouth at bedtime as needed (allergies).     . cholecalciferol (VITAMIN D) 1000 UNITS tablet Take 1,000 Units by mouth every other day.    Marland Kitchen HYDROcodone-homatropine (HYCODAN) 5-1.5 MG/5ML syrup Take 5 mLs by mouth every 6 (six) hours as needed for cough. 120 mL 0  . loratadine (CLARITIN) 10 MG tablet Take 10 mg by mouth daily as needed for allergies.    . metoprolol succinate (TOPROL-XL) 25 MG 24 hr tablet Take 1.5 tablets (37.5 mg total) by mouth daily. 135 tablet 3  . multivitamin (THERAGRAN) per tablet Take 1 tablet by mouth every other day.    Vladimir Faster Glycol-Propyl Glycol (SYSTANE ULTRA) 0.4-0.3 % SOLN Place 1 drop into both eyes in the morning and at bedtime.    . sodium chloride (OCEAN) 0.65 % SOLN nasal spray Place 1 spray into both nostrils as needed for congestion.    . vitamin B-12 (CYANOCOBALAMIN) 500 MCG tablet Take 500 mcg by mouth every other day.     No current facility-administered medications for this visit.    REVIEW OF SYSTEMS:     All other systems were reviewed with the patient and are negative.  PHYSICAL EXAMINATION: ECOG PERFORMANCE STATUS: 0 - Asymptomatic  Vitals:   07/21/20 0853  BP: 138/62  Pulse: 68  Resp: 17  Temp: 97.7 F (36.5 C)  SpO2: 98%   Filed Weights   07/21/20 0853  Weight: 181 lb 6.4 oz (82.3 kg)        LABORATORY DATA:  I have reviewed the data as listed Lab Results  Component Value Date   WBC 4.8 07/21/2020   HGB 13.6 07/21/2020   HCT 42.8  07/21/2020   MCV 100.5 (H) 07/21/2020   PLT 232 07/21/2020   Lab Results  Component Value Date   NA 142 07/21/2020   K 4.1 07/21/2020   CL 108 07/21/2020   CO2 27 07/21/2020    RADIOGRAPHIC STUDIES: I have personally reviewed the radiological reports and agreed with the findings in the report.  ASSESSMENT AND PLAN:  Malignant neoplasm of upper-outer quadrant of right breast in female, estrogen receptor positive ([place]) 07/15/2020: Screening detected 2 areas of distortion in the right breast UOQ 11:00 and 12:00 positions 3 cm apart. 11:00: 1 cm: Grade 2-3 IDC with DCIS ER 95%, PR 95%, HER2 negative, Ki-67 10% 12:00: 0.7 cm: Invasive ductal  carcinoma, receptors are pending Additional area of biopsy: Incorrectly biopsied: Atypical lobular hyperplasia Additional calcifications: Stable, benign  Pathology and radiology counseling:Discussed with the patient, the details of pathology including the type of breast cancer,the clinical staging, the significance of ER, PR and HER-2/neu receptors and the implications for treatment. After reviewing the pathology in detail, we proceeded to discuss the different treatment options between surgery, radiation, chemotherapy, antiestrogen therapies.  Recommendations: 1. Breast conserving surgery with sentinel lymph node biopsy (contralateral breast reduction) 2. Oncotype DX testing to determine if chemotherapy would be of any benefit followed by 3. Adjuvant radiation therapy followed by 4. Adjuvant antiestrogen therapy 5.  Genetic testing Breast MRI being planned  Awaiting the results of the prognostic panel for the 0.7 cm mass at 12 o'clock position  Oncotype counseling: I discussed Oncotype DX test. I explained to the patient that this is a 21 gene panel to evaluate patient tumors DNA to calculate recurrence score. This would help determine whether patient has high risk or low risk breast cancer. She understands that if her tumor was found to be high  risk, she would benefit from systemic chemotherapy. If low risk, no need of chemotherapy.  Return to clinic after surgery to discuss final pathology report and then determine if Oncotype DX testing will need to be sent.     All questions were answered. The patient knows to call the clinic with any problems, questions or concerns.   Rulon Eisenmenger, MD, MPH 07/21/2020    I, Molly Dorshimer, am acting as scribe for Nicholas Lose, MD.  I have reviewed the above documentation for accuracy and completeness, and I agree with the above.

## 2020-07-21 ENCOUNTER — Other Ambulatory Visit: Payer: Self-pay

## 2020-07-21 ENCOUNTER — Encounter: Payer: Self-pay | Admitting: *Deleted

## 2020-07-21 ENCOUNTER — Ambulatory Visit: Payer: Medicare PPO | Admitting: Physical Therapy

## 2020-07-21 ENCOUNTER — Encounter: Payer: Self-pay | Admitting: Genetic Counselor

## 2020-07-21 ENCOUNTER — Encounter: Payer: Self-pay | Admitting: Licensed Clinical Social Worker

## 2020-07-21 ENCOUNTER — Ambulatory Visit
Admission: RE | Admit: 2020-07-21 | Discharge: 2020-07-21 | Disposition: A | Discharge status: Home / Self Care | Payer: Medicare PPO | Source: Ambulatory Visit | Attending: Radiation Oncology | Admitting: Radiation Oncology

## 2020-07-21 ENCOUNTER — Inpatient Hospital Stay: Payer: Medicare PPO | Attending: Hematology and Oncology | Admitting: Hematology and Oncology

## 2020-07-21 ENCOUNTER — Other Ambulatory Visit: Payer: Self-pay | Admitting: *Deleted

## 2020-07-21 ENCOUNTER — Ambulatory Visit (HOSPITAL_BASED_OUTPATIENT_CLINIC_OR_DEPARTMENT_OTHER): Payer: Medicare PPO | Admitting: Genetic Counselor

## 2020-07-21 ENCOUNTER — Inpatient Hospital Stay: Payer: Medicare PPO

## 2020-07-21 DIAGNOSIS — C50411 Malignant neoplasm of upper-outer quadrant of right female breast: Secondary | ICD-10-CM

## 2020-07-21 DIAGNOSIS — Z803 Family history of malignant neoplasm of breast: Secondary | ICD-10-CM | POA: Insufficient documentation

## 2020-07-21 DIAGNOSIS — Z170 Estrogen receptor positive status [ER+]: Secondary | ICD-10-CM

## 2020-07-21 DIAGNOSIS — C50911 Malignant neoplasm of unspecified site of right female breast: Secondary | ICD-10-CM | POA: Diagnosis not present

## 2020-07-21 HISTORY — DX: Family history of malignant neoplasm of breast: Z80.3

## 2020-07-21 LAB — CBC WITH DIFFERENTIAL (CANCER CENTER ONLY)
Abs Immature Granulocytes: 0.01 10*3/uL (ref 0.00–0.07)
Basophils Absolute: 0.1 10*3/uL (ref 0.0–0.1)
Basophils Relative: 1 %
Eosinophils Absolute: 0.2 10*3/uL (ref 0.0–0.5)
Eosinophils Relative: 3 %
HCT: 42.8 % (ref 36.0–46.0)
Hemoglobin: 13.6 g/dL (ref 12.0–15.0)
Immature Granulocytes: 0 %
Lymphocytes Relative: 29 %
Lymphs Abs: 1.4 10*3/uL (ref 0.7–4.0)
MCH: 31.9 pg (ref 26.0–34.0)
MCHC: 31.8 g/dL (ref 30.0–36.0)
MCV: 100.5 fL — ABNORMAL HIGH (ref 80.0–100.0)
Monocytes Absolute: 0.5 10*3/uL (ref 0.1–1.0)
Monocytes Relative: 10 %
Neutro Abs: 2.7 10*3/uL (ref 1.7–7.7)
Neutrophils Relative %: 57 %
Platelet Count: 232 10*3/uL (ref 150–400)
RBC: 4.26 MIL/uL (ref 3.87–5.11)
RDW: 12.1 % (ref 11.5–15.5)
WBC Count: 4.8 10*3/uL (ref 4.0–10.5)
nRBC: 0 % (ref 0.0–0.2)

## 2020-07-21 LAB — CMP (CANCER CENTER ONLY)
ALT: 15 U/L (ref 0–44)
AST: 18 U/L (ref 15–41)
Albumin: 3.7 g/dL (ref 3.5–5.0)
Alkaline Phosphatase: 54 U/L (ref 38–126)
Anion gap: 7 (ref 5–15)
BUN: 13 mg/dL (ref 8–23)
CO2: 27 mmol/L (ref 22–32)
Calcium: 9.1 mg/dL (ref 8.9–10.3)
Chloride: 108 mmol/L (ref 98–111)
Creatinine: 0.75 mg/dL (ref 0.44–1.00)
GFR, Estimated: >60 mL/min (ref >60)
Glucose, Bld: 70 mg/dL (ref 70–99)
Potassium: 4.1 mmol/L (ref 3.5–5.1)
Sodium: 142 mmol/L (ref 135–145)
Total Bilirubin: 0.5 mg/dL (ref 0.3–1.2)
Total Protein: 6.9 g/dL (ref 6.5–8.1)

## 2020-07-21 LAB — GENETIC SCREENING ORDER

## 2020-07-21 NOTE — Assessment & Plan Note (Addendum)
07/15/2020: Screening detected 2 areas of distortion in the right breast UOQ 11:00 and 12:00 positions 3 cm apart. 11:00: 1 cm: Grade 2-3 IDC with DCIS ER 95%, PR 95%, HER2 negative, Ki-67 10% 12:00: 0.7 cm: Invasive ductal carcinoma, receptors are pending Additional area of biopsy: Incorrectly biopsied: Atypical lobular hyperplasia Additional calcifications: Stable, benign  Pathology and radiology counseling:Discussed with the patient, the details of pathology including the type of breast cancer,the clinical staging, the significance of ER, PR and HER-2/neu receptors and the implications for treatment. After reviewing the pathology in detail, we proceeded to discuss the different treatment options between surgery, radiation, chemotherapy, antiestrogen therapies.  Recommendations: 1. Breast conserving surgery with sentinel lymph node biopsy (contralateral breast reduction) 2. Oncotype DX testing to determine if chemotherapy would be of any benefit followed by 3. Adjuvant radiation therapy followed by 4. Adjuvant antiestrogen therapy 5.  Genetic testing Breast MRI being planned  Awaiting the results of the prognostic panel for the 0.7 cm mass at 12 o'clock position  Oncotype counseling: I discussed Oncotype DX test. I explained to the patient that this is a 21 gene panel to evaluate patient tumors DNA to calculate recurrence score. This would help determine whether patient has high risk or low risk breast cancer. She understands that if her tumor was found to be high risk, she would benefit from systemic chemotherapy. If low risk, no need of chemotherapy.  Return to clinic after surgery to discuss final pathology report and then determine if Oncotype DX testing will need to be sent.

## 2020-07-21 NOTE — Progress Notes (Signed)
Radiation Oncology         [phone] ________________________________  Multidisciplinary Breast Oncology Clinic [facility]) Initial Outpatient Consultation  Name: Sheri Sims MRN: [id number]  Date: 07/21/2020  DOB: 1951-01-19  WN:[id number], Baldemar Friday., PA-C  Coralie Keens, MD   REFERRING PHYSICIAN: Coralie Keens, MD  DIAGNOSIS: The encounter diagnosis was Malignant neoplasm of upper-outer quadrant of right breast in female, estrogen receptor positive ([place]).  Stage IA Right Breast UOQ, invasive ductal breast cancer carcinoma, ER+ / PR+ / Her2-, Grade 2-3    ICD-10-CM   1. Malignant neoplasm of upper-outer quadrant of right breast in female, estrogen receptor positive ([place])  C50.411    Z17.0     HISTORY OF PRESENT ILLNESS::Sheri Sims is a 70 y.o. female who is presenting to the office today for evaluation of her newly diagnosed breast cancer. She is accompanied by her niece. She is doing well overall.   Family history is significant for her mother diagnosed with breast cancer and succumbing to the disease.  Her sister also was diagnosed with breast cancer and who is a survivor.  She had routine screening mammography on 06/08/2020 that showed indeterminate grouped calcifications in the upper inner aspect of the right breast at anterior depth. There was also noted to be an indeterminate 1.5 cm architectural distortion in the right breast at anterior depth and central to the nipple. She underwent unilateral diagnostic mammography with tomography and right breast ultrasonography at [facility] on 06/23/2020 that showed a 1 cm irregular region in the upper outer aspect of the right breast at middle depth that had differential diagnoses of radial scar vs malignancy. There was also noted to be a 7 mm irregular area in the right breast central to the nipple at middle depth with the same differential diagnoses as above.  Biopsy on 07/08/20 showed: Invasive ductal breast cancer with intermediate to  high-grade DCIS. Prognostic indicators significant for: estrogen receptor, 95 % positive and progesterone receptor, 95% Proliferation marker Ki67 at 10%. HER2 negative.   The patient was referred today for presentation in the multidisciplinary conference.  Radiology studies and pathology slides were presented there for review and discussion of treatment options.  A consensus was discussed regarding potential next steps.  PREVIOUS RADIATION THERAPY: No  PAST MEDICAL HISTORY:  Past Medical History:  Diagnosis Date  . Atrial flutter ([place])   . Costochondritis   . Enthesopathy of hip region   . Fatigue   . Fibromyalgia   . Helicobacter pylori infection   . Insomnia   . MVP (mitral valve prolapse)    OF ANTERIOR MITRAL VALVE LEAFLET  . Obesity   . Paroxysmal atrial fibrillation (H[facility])   . Pharyngitis   . Pneumonia   . Sinusitis, chronic     PAST SURGICAL HISTORY: Past Surgical History:  Procedure Laterality Date  . APPENDECTOMY    . ATRIAL FIBRILLATION ABLATION N/A 11/06/2019   Procedure: ATRIAL FIBRILLATION ABLATION;  Surgeon: Thompson Grayer, MD;  Location: [facility];  Service: Cardiovascular;  Laterality: N/A;  . CARDIAC CATHETERIZATION  2010   Normal  . STRESS ECHO TEST  05/31/2010   NO ISCHEMIA  . TONSILLECTOMY    . TRANSTHORACIC ECHOCARDIOGRAM  05/16/2010   EF 55-60%    FAMILY HISTORY:  Family History  Problem Relation Age of Onset  . Breast cancer Mother 81  . Breast cancer Sister 53  . Cancer Maternal Grandmother        unknown type; dx 65  SOCIAL HISTORY:  Social History   Socioeconomic History  . Marital status: Married    Spouse name: Not on file  . Number of children: 2  . Years of education: Not on file  . Highest education level: Not on file  Occupational History  . Occupation: Retired Tourist information centre manager  Tobacco Use  . Smoking status: Never Smoker  . Smokeless tobacco: Never Used  Vaping Use  . Vaping Use: Never used  Substance and Sexual  Activity  . Alcohol use: No  . Drug use: No  . Sexual activity: Not on file  Other Topics Concern  . Not on file  Social History Narrative   Lives in [place] with spouse.   Social Determinants of Health   Financial Resource Strain: Not on file  Food Insecurity: No Food Insecurity  . Worried About Charity fundraiser in the Last Year: Never true  . Ran Out of Food in the Last Year: Never true  Transportation Needs: No Transportation Needs  . Lack of Transportation (Medical): No  . Lack of Transportation (Non-Medical): No  Physical Activity: Not on file  Stress: Not on file  Social Connections: Not on file    ALLERGIES:  Allergies  Allergen Reactions  . Codeine Nausea And Vomiting and Nausea Only  . Sulfur Dioxide Nausea Only  . Sulfites Nausea And Vomiting  . Eliquis [Apixaban]     rash  . Codeine Phosphate Nausea And Vomiting  . Other     Prednisone cream caused blisters   . Sulfa Antibiotics Nausea And Vomiting    MEDICATIONS:  Current Outpatient Medications  Medication Sig Dispense Refill  . acetaminophen (TYLENOL) 650 MG CR tablet Take 650 mg by mouth every 8 (eight) hours as needed for pain.     Marland Kitchen apixaban (ELIQUIS) 5 MG TABS tablet Take 5 mg by mouth 2 (two) times daily.    . Calcium Carbonate (CALCIUM 600 PO) Take 1 tablet by mouth 3 (three) times a week.     . calcium carbonate (TUMS - DOSED IN MG ELEMENTAL CALCIUM) 500 MG chewable tablet Chew 1 tablet by mouth as needed for indigestion or heartburn.     . Cetirizine HCl 10 MG CAPS Take 5 mg by mouth at bedtime as needed (allergies).     . cholecalciferol (VITAMIN D) 1000 UNITS tablet Take 1,000 Units by mouth every other day.    Marland Kitchen HYDROcodone-homatropine (HYCODAN) 5-1.5 MG/5ML syrup Take 5 mLs by mouth every 6 (six) hours as needed for cough. 120 mL 0  . loratadine (CLARITIN) 10 MG tablet Take 10 mg by mouth daily as needed for allergies.    . metoprolol succinate (TOPROL-XL) 25 MG 24 hr tablet Take 1.5 tablets  (37.5 mg total) by mouth daily. 135 tablet 3  . multivitamin (THERAGRAN) per tablet Take 1 tablet by mouth every other day.    Vladimir Faster Glycol-Propyl Glycol (SYSTANE ULTRA) 0.4-0.3 % SOLN Place 1 drop into both eyes in the morning and at bedtime.    . sodium chloride (OCEAN) 0.65 % SOLN nasal spray Place 1 spray into both nostrils as needed for congestion.    . vitamin B-12 (CYANOCOBALAMIN) 500 MCG tablet Take 500 mcg by mouth every other day.     No current facility-administered medications for this encounter.    REVIEW OF SYSTEMS: A 10+ POINT REVIEW OF SYSTEMS WAS OBTAINED including neurology, dermatology, psychiatry, cardiac, respiratory, lymph, extremities, GI, GU, musculoskeletal, constitutional, reproductive, HEENT. On the provided form, she reports minimal pain  in the breast since her biopsy. She denies swelling in her right arm or hand and any other symptoms.    PHYSICAL EXAM:  Vitals - 1 value per visit 7/82/4235  SYSTOLIC 361  DIASTOLIC 62  Pulse 68  Temperature 97.7  Respirations 17  Weight (lb) 181.4  Height '5\' 5"'   BMI 30.19  VISIT REPORT     Lungs are clear to auscultation bilaterally. Heart has regular rate and rhythm. No palpable cervical, supraclavicular, or axillary adenopathy. Abdomen soft, non-tender, normal bowel sounds. Left breast with no palpable mass, nipple discharge, or bleeding.  Extremely large and pendulous Right breast with biopsy changes in the upper to 11 o'clock position of the breast.  No dominant mass appreciated in the breast nipple discharge or bleeding. the right breast is extremely large and pendulous.   KPS = 90  100 - Normal; no complaints; no evidence of disease. 90   - Able to carry on normal activity; minor signs or symptoms of disease. 80   - Normal activity with effort; some signs or symptoms of disease. 49   - Cares for self; unable to carry on normal activity or to do active work. 60   - Requires occasional assistance, but is able  to care for most of his personal needs. 50   - Requires considerable assistance and frequent medical care. 42   - Disabled; requires special care and assistance. 34   - Severely disabled; hospital admission is indicated although death not imminent. 76   - Very sick; hospital admission necessary; active supportive treatment necessary. 10   - Moribund; fatal processes progressing rapidly. 0     - Dead  Karnofsky DA, Abelmann [place], Craver LS and Burchenal JH [phone]) The use of the nitrogen mustards in the palliative treatment of carcinoma: with particular reference to bronchogenic carcinoma Cancer 1 634-56  LABORATORY DATA:  Lab Results  Component Value Date   WBC 4.8 07/21/2020   HGB 13.6 07/21/2020   HCT 42.8 07/21/2020   MCV 100.5 (H) 07/21/2020   PLT 232 07/21/2020   Lab Results  Component Value Date   NA 142 07/21/2020   K 4.1 07/21/2020   CL 108 07/21/2020   CO2 27 07/21/2020   Lab Results  Component Value Date   ALT 15 07/21/2020   AST 18 07/21/2020   ALKPHOS 54 07/21/2020   BILITOT 0.5 07/21/2020    PULMONARY FUNCTION TEST:   Recent Review Flowsheet Data   There is no flowsheet data to display.     RADIOGRAPHY: No results found.    IMPRESSION: Stage IA Right Breast UOQ, invasive ductal breast cancer carcinoma, ER+ / PR+ / Her2-, Grade 2-3  The patient will be a good candidate for breast conservation with radiotherapy to the right breast. We discussed the general course of radiation, potential side effects, and toxicities with radiation and the patient is interested in this approach.  The patient does have extremely large pendulous breasts and would be a good candidate for breast reduction at the time of her lumpectomy.  Patient does have chronic pain in her neck and shoulder area from her large pendulous breasts.  The patient did have a third biopsy in the same general area as her previous biopsies which did show malignancy.  This area could likely be covered within a  reasonable lumpectomy cavity in addition.   PLAN:  1. Genetics    2. MRI 3. Lumpectomy with breast reduction at the same time (Dr. [name]) 4. Oncotype  to determine the potential benefit from adjuvant chemotherapy 5. Adjuvant radiation therapy 6. Aromatase inhibitor   ------------------------------------------------  Blair Promise, PhD, MD  This document serves as a record of services personally performed by Gery Pray, MD. It was created on his behalf by Clerance Lav, a trained medical scribe. The creation of this record is based on the scribe's personal observations and the provider's statements to them. This document has been checked and approved by the attending provider.

## 2020-07-21 NOTE — Progress Notes (Signed)
REFERRING PROVIDER: Nicholas Lose, MD  PRIMARY PROVIDER:  Aletha Halim., PA-C  PRIMARY REASON FOR VISIT:  . Malignant neoplasm of upper-outer quadrant of right breast in female, estrogen receptor positive ([place])  . Family history of breast cancer   I connected with Sheri Sims on 07/21/2020 at 10:45am EDT by Webex video conference and verified that I am speaking with the correct person using two identifiers.   Patient location: [facility] Provider location: [place] OF PRESENT ILLNESS:   Sheri Sims, a 70 y.o. female, was seen for a [place] cancer genetics consultation during breast multidisciplinary clinic at the request of Dr. Lindi Adie due to a personal and family history of cancer.  Sheri Sims presents to clinic today to discuss the possibility of a hereditary predisposition to cancer, to discuss genetic testing, and to further clarify her future cancer risks, as well as potential cancer risks for family members.   In 2022, at the age of 45, Sheri Sims was diagnosed with invasive ductal carcinoma of the right breast. The preliminary treatment plan includes breast conserving surgery, Oncotype to determine potential benefit of chemotherapy, adjuvant radiation, and anti-estrogens.   CANCER HISTORY:  Oncology History  Malignant neoplasm of upper-outer quadrant of right breast in female, estrogen receptor positive ([place])  07/15/2020 Initial Diagnosis   Screening mammogram showed calcifications in the upper inner right breast and a 1.5cm architectural distortion. Diagnostic mammogram and US showed a 1.0cm upper outer right breast mass and a 0.7cm irregular area central to the nipple.  ER 95%, PR 95%, HER2 negative, Ki-67 10%   07/21/2020 Cancer Staging   Staging form: Breast, AJCC 8th Edition - Clinical stage from 07/21/2020: Stage IA (cT1b, cN0, cM0, G3, ER+, PR+, HER2-) - Signed by Nicholas Lose, MD on 07/21/2020     RISK FACTORS:  Menarche was at age 32.  First live birth at  age 82.  OCP use for approximately 2 years.  Ovaries intact: yes.  Hysterectomy: yes in 1991 Menopausal status: postmenopausal.  HRT use: 0 years. Colonoscopy: yes; most recent in 2017 per patient. Mammogram within the last year: yes. Up to date with pelvic exams: most recent PAP 4 or 5 years ago.  Past Medical History:  Diagnosis Date  . Atrial flutter ([place])   . Costochondritis   . Enthesopathy of hip region   . Fatigue   . Fibromyalgia   . Helicobacter pylori infection   . Insomnia   . MVP (mitral valve prolapse)    OF ANTERIOR MITRAL VALVE LEAFLET  . Obesity   . Paroxysmal atrial fibrillation (HCC)   . Pharyngitis   . Pneumonia   . Sinusitis, chronic     Past Surgical History:  Procedure Laterality Date  . APPENDECTOMY    . ATRIAL FIBRILLATION ABLATION N/A 11/06/2019   Procedure: ATRIAL FIBRILLATION ABLATION;  Surgeon: Thompson Grayer, MD;  Location: [place] [facility];  Service: Cardiovascular;  Laterality: N/A;  . CARDIAC CATHETERIZATION  2010   Normal  . STRESS ECHO TEST  05/31/2010   NO ISCHEMIA  . TONSILLECTOMY    . TRANSTHORACIC ECHOCARDIOGRAM  05/16/2010   EF 55-60%    Social History   Socioeconomic History  . Marital status: Married    Spouse name: Not on file  . Number of children: 2  . Years of education: Not on file  . Highest education level: Not on file  Occupational History  . Occupation: Retired Tourist information centre manager  Tobacco Use  . Smoking status: Never  Smoker  . Smokeless tobacco: Never Used  Vaping Use  . Vaping Use: Never used  Substance and Sexual Activity  . Alcohol use: No  . Drug use: No  . Sexual activity: Not on file  Other Topics Concern  . Not on file  Social History Narrative   Lives in [place] with spouse.   Social Determinants of Health   Financial Resource Strain: Not on file  Food Insecurity: Not on file  Transportation Needs: Not on file  Physical Activity: Not on file  Stress: Not on file  Social Connections: Not on file      FAMILY HISTORY:  We obtained a detailed, 4-generation family history.  Significant diagnoses are listed below: Family History  Problem Relation Age of Onset  . Breast cancer Mother 26  . Breast cancer Sister 23  . Cancer Maternal Grandmother        metastatic, unknown primary; dx 40  . Breast cancer Cousin        d. 72; maternal cousin    Sheri Sims has two sons in their 46s.  Sheri Sims has one sister, age 11, who was diagnosed with breast cancer at age 31.    Sheri Sims mother had metastatic breast cancer diagnosed at age 85.  Sheri Sims has a maternal cousin with a breast cancer history who passed away at age 4.  Sheri Sims maternal grandmother had metastatic cancer of unknown primary diagnosed in her 58s.  No other maternal family history of cancer was reported.   Sheri Sims father passed away at age 64 and did not have cancer.  No paternal family history of cancer was reported.   Sheri Sims is unaware of previous family history of genetic testing for hereditary cancer risks. Patient's maternal ancestors are of [place] and [place] descent, and paternal ancestors are of [place] descent. There is no reported Ashkenazi Jewish ancestry. There is no known consanguinity.  GENETIC COUNSELING ASSESSMENT: Sheri Sims is a 70 y.o. female with a personal and family history of cancer which is somewhat suggestive of a hereditary cancer syndrome and predisposition to cancer given the presence of related cancers in her maternal family. We, therefore, discussed and recommended the following at today's visit.   DISCUSSION: We discussed that 5 - 10% of cancer is hereditary, with most cases of hereditary breast cancer associated with mutations in BRCA1/2.  There are other genes that can be associated with hereditary breast cancer syndromes.  Type of cancer risk and level of risk are gene-specific.  We discussed that testing is beneficial for several reasons including knowing how to follow individuals after  completing their treatment, identifying whether potential treatment options would be beneficial, and understanding if other family members could be at risk for cancer and allowing them to undergo genetic testing.   We reviewed the characteristics, features and inheritance patterns of hereditary cancer syndromes. We also discussed genetic testing, including the appropriate family members to test, the process of testing, insurance coverage and turn-around-time for results. We discussed the implications of a negative, positive and/or variant of uncertain significant result. In order to get genetic test results in a timely manner so that Sheri Sims can use these genetic test results for surgical decisions, we recommended Sheri Sims pursue genetic testing for the [place] BRCAPlus STAT Panel.  The BRCAplus panel offered by [employer] and includes sequencing and deletion/duplication analysis for the following 8 genes: ATM, BRCA1, BRCA2, CDH1, CHEK2, PALB2, PTEN, and TP53. Once complete, we recommend Sheri Sims  pursue reflex genetic testing to a more comprehensive gene panel.   Sheri Sims  was offered a common hereditary cancer panel (47 genes) and an expanded pan-cancer panel (77 genes). Sheri Sims was informed of the benefits and limitations of each panel, including that expanded pan-cancer panels contain genes that do not have clear management guidelines at this point in time.  We also discussed that as the number of genes included on a panel increases, the chances of variants of uncertain significance increases.  After considering the benefits and limitations of each gene panel, Sheri Sims  elected to have a common hereditary cancer panel through [place].  The CustomNext-Cancer+RNAinsight panel offered by [facility] includes sequencing and rearrangement analysis for the following 47 genes:  APC, ATM, AXIN2, BARD1, BMPR1A, BRCA1, BRCA2, BRIP1, CDH1, CDK4, CDKN2A, CHEK2, DICER1, EPCAM, GREM1, HOXB13, MEN1, MLH1, MSH2, MSH3,  MSH6, MUTYH, NBN, NF1, NF2, NTHL1, PALB2, PMS2, POLD1, POLE, PTEN, RAD51C, RAD51D, RECQL, RET, SDHA, SDHAF2, SDHB, SDHC, SDHD, SMAD4, SMARCA4, STK11, TP53, TSC1, TSC2, and VHL.  RNA data is routinely analyzed for use in variant interpretation for all genes.  Based on Ms. Borgmeyer personal and family history of breast cancer, she meets medical criteria for genetic testing. Despite that she meets criteria, she may still have an out of pocket cost. We discussed that if her out of pocket cost for testing is over $100, the laboratory will contact her and discuss billing options and patient assistance programs.   PLAN: After considering the risks, benefits, and limitations, Ms. Cazarez provided informed consent to pursue genetic testing and the blood sample was sent to [facility] for analysis of the BRCAPlus and CustomNext-Cancer +RNAinsight. Results should be available within approximately 1-2 weeks' time, at which point they will be disclosed by telephone to Ms. Boardman, as will any additional recommendations warranted by these results. Ms. Pilling will receive a summary of her genetic counseling visit and a copy of her results once available. This information will also be available in Epic.   Lastly, we encouraged Ms. Heimsoth to remain in contact with cancer genetics annually so that we can continuously update the family history and inform her of any changes in cancer genetics and testing that may be of benefit for this family.   Ms. Maiorana questions were answered to her satisfaction today. Our contact information was provided should additional questions or concerns arise. Thank you for the referral and allowing [place] to share in the care of your patient.   Vauda Salvucci M. Joette Catching, [place], [facility] Genetic Counselor [email or web address] (P) [phone]  The patient was seen for a total 20 minutes of audio and video minutes in face-to-face genetic counseling.  Drs. Magrinat, Lindi Adie and/or Burr Medico were available to discuss  this case as needed.   _______________________________________________________________________ For Office Staff:  Number of people involved in session: 1 Was an Intern/ student involved with case: no

## 2020-07-21 NOTE — Progress Notes (Signed)
[place] Clinical Social Work  Initial Assessment   Sheri Sims is a 70 y.o. year old female accompanied by patient and niece- Jeani Hawking. Clinical Social Work was referred by [facility] for assessment of psychosocial needs.   SDOH (Social Determinants of Health) assessments performed: Yes SDOH Interventions   Flowsheet Row Most Recent Value  SDOH Interventions   Food Insecurity Interventions Intervention Not Indicated  Transportation Interventions Intervention Not Indicated      Distress Screen completed: Yes ONCBCN DISTRESS SCREENING 07/21/2020  Screening Type Initial Screening  Distress experienced in past week (1-10) 1      Family/Social Information:  . Housing Arrangement: patient lives with husband . Family members/support persons in your life? Family (husband, sons, sister, nieces), friends, and Church . Transportation concerns: no  . Employment: Retired from Electrical engineer. Income source: Retirement savings/ income . Financial concerns: No o Type of concern: None . Food access concerns: no . Religious or spiritual practice: yes, very connected to her faith and church community . Services Currently in place:  n/a  Coping/ Adjustment to diagnosis: . Patient understands treatment plan and what happens next? yes, some normal nerves around treatment, but overall calm and ready to move forward . Concerns about diagnosis and/or treatment: I'm not especially worried about anything . Patient reported stressors: n/a . Hopes and priorities: to remain strong with her faith to help support her as she goes through treatment . Patient enjoys time with family/ friends and engaging in 69 . Current coping skills/ strengths: Average or above average intelligence, Capable of independent living, Religious Affiliation and Supportive family/friends    SUMMARY: Current SDOH Barriers:  . No significant SDOH barriers noted today  Clinical Social Work Clinical Goal(s):  Marland Kitchen No clinical social work  goals at this time  Interventions: . Discussed common feeling and emotions when being diagnosed with cancer, and the importance of support during treatment . Informed patient of the support team roles and support services at [facility] . Provided CSW contact information and encouraged patient to call with any questions or concerns   Follow Up Plan: Patient will contact CSW with any support or resource needs Patient verbalizes understanding of plan: Yes    Christeen Douglas , LCSW

## 2020-07-21 NOTE — Progress Notes (Signed)
error 

## 2020-07-22 ENCOUNTER — Telehealth: Payer: Self-pay | Admitting: Hematology and Oncology

## 2020-07-22 NOTE — Telephone Encounter (Signed)
No 11/9 los, no changes made to pt schedule  

## 2020-07-29 ENCOUNTER — Ambulatory Visit (HOSPITAL_COMMUNITY)
Admission: RE | Admit: 2020-07-29 | Discharge: 2020-07-29 | Disposition: A | Discharge status: Home / Self Care | Payer: Medicare PPO | Source: Ambulatory Visit | Attending: Surgery | Admitting: Surgery

## 2020-07-29 ENCOUNTER — Other Ambulatory Visit: Payer: Self-pay

## 2020-07-29 DIAGNOSIS — C50411 Malignant neoplasm of upper-outer quadrant of right female breast: Secondary | ICD-10-CM | POA: Insufficient documentation

## 2020-07-29 DIAGNOSIS — N6489 Other specified disorders of breast: Secondary | ICD-10-CM | POA: Diagnosis not present

## 2020-07-29 DIAGNOSIS — Z170 Estrogen receptor positive status [ER+]: Secondary | ICD-10-CM | POA: Insufficient documentation

## 2020-07-29 MED ORDER — GADOBUTROL 1 MMOL/ML IV SOLN
8.0000 mL | Freq: Once | INTRAVENOUS | Status: AC | PRN
  Administered 2020-07-29: 8 mL via INTRAVENOUS

## 2020-07-30 ENCOUNTER — Other Ambulatory Visit: Payer: Self-pay | Admitting: Surgery

## 2020-07-30 ENCOUNTER — Telehealth: Payer: Self-pay

## 2020-07-30 ENCOUNTER — Telehealth: Payer: Self-pay | Admitting: Genetic Counselor

## 2020-07-30 ENCOUNTER — Other Ambulatory Visit: Payer: Self-pay

## 2020-07-30 ENCOUNTER — Ambulatory Visit: Payer: Medicare PPO | Admitting: Plastic Surgery

## 2020-07-30 ENCOUNTER — Telehealth: Payer: Self-pay | Admitting: *Deleted

## 2020-07-30 ENCOUNTER — Encounter: Payer: Self-pay | Admitting: Genetic Counselor

## 2020-07-30 ENCOUNTER — Encounter: Payer: Self-pay | Admitting: Plastic Surgery

## 2020-07-30 ENCOUNTER — Encounter: Payer: Self-pay | Admitting: *Deleted

## 2020-07-30 VITALS — BP 116/75 | HR 78 | Ht 65.0 in | Wt 183.0 lb

## 2020-07-30 DIAGNOSIS — C50411 Malignant neoplasm of upper-outer quadrant of right female breast: Secondary | ICD-10-CM | POA: Diagnosis not present

## 2020-07-30 DIAGNOSIS — Z1379 Encounter for other screening for genetic and chromosomal anomalies: Secondary | ICD-10-CM | POA: Insufficient documentation

## 2020-07-30 DIAGNOSIS — Z170 Estrogen receptor positive status [ER+]: Secondary | ICD-10-CM

## 2020-07-30 DIAGNOSIS — Z853 Personal history of malignant neoplasm of breast: Secondary | ICD-10-CM

## 2020-07-30 DIAGNOSIS — G8929 Other chronic pain: Secondary | ICD-10-CM | POA: Diagnosis not present

## 2020-07-30 DIAGNOSIS — I480 Paroxysmal atrial fibrillation: Secondary | ICD-10-CM | POA: Diagnosis not present

## 2020-07-30 DIAGNOSIS — M546 Pain in thoracic spine: Secondary | ICD-10-CM

## 2020-07-30 NOTE — Telephone Encounter (Addendum)
Revealed negative genetic testing.  Discussed that we do not know why she has breast cancer or why there is cancer in the family. It could be sporadic/familial, due to a different gene that we are not testing, or maybe our current technology may not be able to pick something up.  It will be important for her to keep in contact with genetics to keep up with whether additional testing may be needed.  Results of expanded pan-cancer panel are pending.

## 2020-07-30 NOTE — Telephone Encounter (Signed)
Spoke with patient to follow up from [facility] 1/19. Denies any questions or concerns at this time.  Encouraged her to call should anything arise. Patient verbalized understanding.

## 2020-07-30 NOTE — Telephone Encounter (Signed)
Faxed referral to [facility] for Mastectomy supplies °

## 2020-07-30 NOTE — Progress Notes (Signed)
Patient ID: Sheri Sims, female    DOB: Jun 25, 1951, 70 y.o.   MRN: [id number]   Chief Complaint  Patient presents with  . Advice Only  . Breast Cancer    The patient is a 70 year old female referred by Dr. Ninfa Linden for breast reconstruction. She was recently diagnosed with RIGHT breast cancer. She had a screening mammogram 12/21 which showed calcifications in the upper inner right breast quadrant. A diagnostic mammogram and ultrasound was done showing a 1 cm upper outer breast mass and 8.7 cm irregular area central to the nipple. The pathology from the biopsy showed atypical lobular hyperplasia and carcinoma in situ. It is estrogen and progesterone positive and HER-2 negative with a Stage IA.  She has a history of afibrillation with an ablation for which she takes Xarelto.  She was told by her cardiologist she could come off of it if needed. She plans to come off of it 5 days prior to surgery. Her past surgical history includes appendectomy, cardiac ablation and a tonsillectomy. She has a family history of breast cancer.    The sternal notch to right nipple is 38 cm and 34 cm on the left with a nipple to inframammary distance of 18 cm.  She is 5 feet 5 inches tall and weighs 183 pounds. She also complains of back and neck pain from her large breasts for the past many years. She is planning on a partial mastectomy with postop radiation.   Review of Systems  Constitutional: Negative for activity change and appetite change.  HENT: Negative.   Respiratory: Negative for chest tightness and shortness of breath.   Cardiovascular: Negative for leg swelling.  Gastrointestinal: Negative for abdominal distention and abdominal pain.  Endocrine: Negative.   Genitourinary: Negative.   Musculoskeletal: Positive for back pain and neck pain.  Hematological: Negative.   Psychiatric/Behavioral: Negative.     Past Medical History:  Diagnosis Date  . Atrial flutter ([place])   . Costochondritis   .  Enthesopathy of hip region   . Family history of breast cancer 07/21/2020  . Fatigue   . Fibromyalgia   . Helicobacter pylori infection   . Insomnia   . MVP (mitral valve prolapse)    OF ANTERIOR MITRAL VALVE LEAFLET  . Obesity   . Paroxysmal atrial fibrillation (HCC)   . Pharyngitis   . Pneumonia   . Sinusitis, chronic     Past Surgical History:  Procedure Laterality Date  . APPENDECTOMY    . ATRIAL FIBRILLATION ABLATION N/A 11/06/2019   Procedure: ATRIAL FIBRILLATION ABLATION;  Surgeon: Thompson Grayer, MD;  Location: [place] [facility];  Service: Cardiovascular;  Laterality: N/A;  . CARDIAC CATHETERIZATION  2010   Normal  . STRESS ECHO TEST  05/31/2010   NO ISCHEMIA  . TONSILLECTOMY    . TRANSTHORACIC ECHOCARDIOGRAM  05/16/2010   EF 55-60%      Current Outpatient Medications:  .  acetaminophen (TYLENOL) 650 MG CR tablet, Take 650 mg by mouth every 8 (eight) hours as needed for pain. , Disp: , Rfl:  .  Calcium Carbonate (CALCIUM 600 PO), Take 1 tablet by mouth 3 (three) times a week. , Disp: , Rfl:  .  calcium carbonate (TUMS - DOSED IN MG ELEMENTAL CALCIUM) 500 MG chewable tablet, Chew 1 tablet by mouth as needed for indigestion or heartburn. , Disp: , Rfl:  .  Cetirizine HCl 10 MG CAPS, Take 5 mg by mouth at bedtime as needed (allergies). , Disp: ,  Rfl:  .  cholecalciferol (VITAMIN D) 1000 UNITS tablet, Take 1,000 Units by mouth every other day., Disp: , Rfl:  .  loratadine (CLARITIN) 10 MG tablet, Take 10 mg by mouth daily as needed for allergies., Disp: , Rfl:  .  metoprolol succinate (TOPROL-XL) 25 MG 24 hr tablet, Take 1.5 tablets (37.5 mg total) by mouth daily., Disp: 135 tablet, Rfl: 3 .  multivitamin (THERAGRAN) per tablet, Take 1 tablet by mouth every other day., Disp: , Rfl:  .  Polyethyl Glycol-Propyl Glycol (SYSTANE ULTRA) 0.4-0.3 % SOLN, Place 1 drop into both eyes in the morning and at bedtime., Disp: , Rfl:  .  Rivaroxaban (XARELTO) 15 MG TABS tablet, , Disp: ,  Rfl:  .  sodium chloride (OCEAN) 0.65 % SOLN nasal spray, Place 1 spray into both nostrils as needed for congestion., Disp: , Rfl:  .  vitamin B-12 (CYANOCOBALAMIN) 500 MCG tablet, Take 500 mcg by mouth every other day., Disp: , Rfl:  .  apixaban (ELIQUIS) 5 MG TABS tablet, Take 5 mg by mouth 2 (two) times daily., Disp: , Rfl:  .  HYDROcodone-homatropine (HYCODAN) 5-1.5 MG/5ML syrup, Take 5 mLs by mouth every 6 (six) hours as needed for cough. (Patient not taking: Reported on 07/30/2020), Disp: 120 mL, Rfl: 0   Objective:   Vitals:   07/30/20 0814  BP: 116/75  Pulse: 78  SpO2: 97%    Physical Exam Vitals and nursing note reviewed.  Constitutional:      Appearance: Normal appearance.  HENT:     Head: Normocephalic and atraumatic.  Cardiovascular:     Rate and Rhythm: Normal rate.     Pulses: Normal pulses.  Pulmonary:     Effort: Pulmonary effort is normal. No respiratory distress.  Abdominal:     General: Abdomen is flat. There is no distension.     Tenderness: There is no abdominal tenderness.  Skin:    General: Skin is warm.     Capillary Refill: Capillary refill takes less than 2 seconds.     Coloration: Skin is not jaundiced.     Findings: No bruising.  Neurological:     General: No focal deficit present.     Mental Status: She is alert and oriented to person, place, and time.  Psychiatric:        Mood and Affect: Mood normal.        Behavior: Behavior normal.        Thought Content: Thought content normal.     Assessment & Plan:  Paroxysmal atrial fibrillation (HCC)  Malignant neoplasm of upper-outer quadrant of right breast in female, estrogen receptor positive ([place])  Chronic bilateral thoracic back pain  We discussed some of the options for breast reconstruction. In light of her wanting to have a partial mastectomy she is an excellent candidate for bilateral oncoplastic breast reduction.  Plan for bilateral oncoplastic breast reduction. I have spoken with  Dr. Ninfa Linden who agrees with the plan. I confirmed all information is correct above with the patient.  Pictures were obtained of the patient and placed in the chart with the patient's or guardian's permission.   [place], DO

## 2020-08-03 ENCOUNTER — Encounter: Payer: Self-pay | Admitting: Genetic Counselor

## 2020-08-03 ENCOUNTER — Telehealth: Payer: Self-pay | Admitting: Genetic Counselor

## 2020-08-03 ENCOUNTER — Ambulatory Visit: Payer: Self-pay | Admitting: Genetic Counselor

## 2020-08-03 DIAGNOSIS — Z803 Family history of malignant neoplasm of breast: Secondary | ICD-10-CM

## 2020-08-03 DIAGNOSIS — Z1379 Encounter for other screening for genetic and chromosomal anomalies: Secondary | ICD-10-CM

## 2020-08-03 DIAGNOSIS — Z170 Estrogen receptor positive status [ER+]: Secondary | ICD-10-CM

## 2020-08-03 DIAGNOSIS — C50411 Malignant neoplasm of upper-outer quadrant of right female breast: Secondary | ICD-10-CM

## 2020-08-03 NOTE — Progress Notes (Signed)
HPI:  Ms. Pupo was previously seen in the [facility] clinic due to a personal and family history of cancer and concerns regarding a hereditary predisposition to cancer. Please refer to our prior cancer genetics clinic note for more information regarding our discussion, assessment and recommendations, at the time. Ms. Eskelson recent genetic test results were disclosed to her, as were recommendations warranted by these results. These results and recommendations are discussed in more detail below.  CANCER HISTORY:  Oncology History  Malignant neoplasm of upper-outer quadrant of right breast in female, estrogen receptor positive ([place])  07/15/2020 Initial Diagnosis   Screening mammogram showed calcifications in the upper inner right breast and a 1.5cm architectural distortion. Diagnostic mammogram and US showed a 1.0cm upper outer right breast mass and a 0.7cm irregular area central to the nipple.  ER 95%, PR 95%, HER2 negative, Ki-67 10%   07/21/2020 Cancer Staging   Staging form: Breast, AJCC 8th Edition - Clinical stage from 07/21/2020: Stage IA (cT1b, cN0, cM0, G3, ER+, PR+, HER2-) - Signed by Nicholas Lose, MD on 07/21/2020   07/28/2020 Genetic Testing   Negative hereditary cancer genetic testing: no pathogenic variants detected in [facility] Panel.  The report date is July 28, 2020.   The BRCAplus panel offered by [employer] and includes sequencing and deletion/duplication analysis for the following 8 genes: ATM, BRCA1, BRCA2, CDH1, CHEK2, PALB2, PTEN, and TP53.  Negative hereditary cancer genetic testing: no pathogenic variants detected in [facility] CustomNext-Cancer +RNAinsight Panel.  The report date is August 01, 2020.   The CustomNext-Cancer+RNAinsight panel offered by [facility] includes sequencing and rearrangement analysis for the following 47 genes:  APC, ATM, AXIN2, BARD1, BMPR1A, BRCA1, BRCA2, BRIP1, CDH1, CDK4, CDKN2A, CHEK2, DICER1, EPCAM, GREM1, HOXB13, MEN1,  MLH1, MSH2, MSH3, MSH6, MUTYH, NBN, NF1, NF2, NTHL1, PALB2, PMS2, POLD1, POLE, PTEN, RAD51C, RAD51D, RECQL, RET, SDHA, SDHAF2, SDHB, SDHC, SDHD, SMAD4, SMARCA4, STK11, TP53, TSC1, TSC2, and VHL.  RNA data is routinely analyzed for use in variant interpretation for all genes.     FAMILY HISTORY:  We obtained a detailed, 4-generation family history.  Significant diagnoses are listed below: Family History  Problem Relation Age of Onset  . Breast cancer Mother 64  . Breast cancer Sister 60  . Cancer Maternal Grandmother        metastatic, unknown primary; dx 72  . Breast cancer Cousin        d. 33; maternal cousin    Ms. Hum has two sons in their 61s.  Ms. Trembley has one sister, age 56, who was diagnosed with breast cancer at age 73.    Ms. Lawley mother had metastatic breast cancer diagnosed at age 51.  Ms. Menard has a maternal cousin with a breast cancer history who passed away at age 17.  Ms. Murfin maternal grandmother had metastatic cancer of unknown primary diagnosed in her 51s.  No other maternal family history of cancer was reported.   Ms. Westervelt father passed away at age 65 and did not have cancer.  No paternal family history of cancer was reported.   Ms. Canal is unaware of previous family history of genetic testing for hereditary cancer risks. Patient's maternal ancestors are of [place] and [place] descent, and paternal ancestors are of English descent. There is no reported Ashkenazi Jewish ancestry. There is no known consanguinity.  GENETIC TEST RESULTS: Genetic testing reported out on August 01, 2020.  The [facility] CustomNext-Cancer +RNAinsight Panel found no pathogenic mutations. The CustomNext-Cancer+RNAinsight  panel offered by [employer] includes sequencing and rearrangement analysis for the following 47 genes:  APC, ATM, AXIN2, BARD1, BMPR1A, BRCA1, BRCA2, BRIP1, CDH1, CDK4, CDKN2A, CHEK2, DICER1, EPCAM, GREM1, HOXB13, MEN1, MLH1, MSH2, MSH3, MSH6, MUTYH, NBN, NF1, NF2, NTHL1,  PALB2, PMS2, POLD1, POLE, PTEN, RAD51C, RAD51D, RECQL, RET, SDHA, SDHAF2, SDHB, SDHC, SDHD, SMAD4, SMARCA4, STK11, TP53, TSC1, TSC2, and VHL.  RNA data is routinely analyzed for use in variant interpretation for all genes.  The test report has been scanned into EPIC and is located under the Molecular Pathology section of the Results Review tab.  A portion of the result report is included below for reference.     We discussed with Ms. Loudenslager that because current genetic testing is not perfect, it is possible there may be a gene mutation in one of these genes that current testing cannot detect, but that chance is small.  We also discussed, that there could be another gene that has not yet been discovered, or that we have not yet tested, that is responsible for the cancer diagnoses in the family. It is also possible there is a hereditary cause for the cancer in the family that Ms. Jedlicka did not inherit and therefore was not identified in her testing.  Therefore, it is important to remain in touch with cancer genetics in the future so that we can continue to offer Ms. Timoney the most up to date genetic testing.    ADDITIONAL GENETIC TESTING: We discussed with Ms. Maeda that there are other genes that are associated with increased cancer risk that can be analyzed. Should Ms. Briggs wish to pursue additional genetic testing, we are happy to discuss and coordinate this testing, at any time.     CANCER SCREENING RECOMMENDATIONS: Ms. Arrambide test result is considered negative (normal).  This means that we have not identified a hereditary cause for her personal history of cancer at this time. Most cancers happen by chance and this negative test suggests that her cancer may fall into this category.    While reassuring, this does not definitively rule out a hereditary predisposition to cancer. It is still possible that there could be genetic mutations that are undetectable by current technology. There could be genetic  mutations in genes that have not been tested or identified to increase cancer risk.  Therefore, it is recommended she continue to follow the cancer management and screening guidelines provided by her oncology and primary healthcare provider.   An individual's cancer risk and medical management are not determined by genetic test results alone. Overall cancer risk assessment incorporates additional factors, including personal medical history, family history, and any available genetic information that may result in a personalized plan for cancer prevention and surveillance  RECOMMENDATIONS FOR FAMILY MEMBERS:  Individuals in this family might be at some increased risk of developing cancer, over the general population risk, simply due to the family history of cancer.  We recommended women in this family have a yearly mammogram beginning at age 106, or 33 years younger than the earliest onset of cancer, an annual clinical breast exam, and perform monthly breast self-exams. Women in this family should also have a gynecological exam as recommended by their primary provider. All family members should be referred for colonoscopy starting at age 60.  It is also possible there is a hereditary cause for the cancer in Ms. Sommerville family that she did not inherit and therefore was not identified in her.  Based on Ms. Amini family history,  we recommended her sister, who was diagnosed with breast cancer at age 1, have genetic counseling and testing. Ms. Smaldone will let us know if we can be of any assistance in coordinating genetic counseling and/or testing for this family member.   FOLLOW-UP: Lastly, we discussed with Ms. Blubaugh that cancer genetics is a rapidly advancing field and it is possible that new genetic tests will be appropriate for her and/or her family members in the future. We encouraged her to remain in contact with cancer genetics on an annual basis so we can update her personal and family histories and let her  know of advances in cancer genetics that may benefit this family.   Our contact number was provided. Ms. Barrientes questions were answered to her satisfaction, and she knows she is welcome to call us at anytime with additional questions or concerns.    Clorinda Wyble M. Joette Catching, [place], [facility] Genetic Counselor [email or web address] (P) [phone]

## 2020-08-03 NOTE — Telephone Encounter (Signed)
Revealed negative genetic testing for [facility] CustomNext-Cancer Panel.  Discussed that we do not know why she has breast cancer or why there is cancer in the family. It could be sporadic/familial, due to a different gene that we are not testing, or maybe our current technology may not be able to pick something up.  It will be important for her to keep in contact with genetics to keep up with whether additional testing may be needed.

## 2020-08-04 ENCOUNTER — Encounter: Payer: Self-pay | Admitting: *Deleted

## 2020-08-05 ENCOUNTER — Other Ambulatory Visit: Payer: Self-pay | Admitting: Surgery

## 2020-08-05 DIAGNOSIS — Z853 Personal history of malignant neoplasm of breast: Secondary | ICD-10-CM

## 2020-08-06 ENCOUNTER — Telehealth: Payer: Self-pay | Admitting: *Deleted

## 2020-08-06 NOTE — Telephone Encounter (Signed)
Receive call from patient stating she has noticed some swelling in her right axilla and tenderness. Patient denies swelling, redness,or fever.  She said that it was very painful last night and kept her awake.  Informed her I would call the surgeon's office to see if they could see her.  I spoke with [facility] and they can't see her till Monday. They will call and give her an appointment.

## 2020-08-10 ENCOUNTER — Encounter: Payer: Self-pay | Admitting: *Deleted

## 2020-08-10 ENCOUNTER — Telehealth: Payer: Self-pay | Admitting: Hematology and Oncology

## 2020-08-10 NOTE — Telephone Encounter (Signed)
Scheduled appt per 2/8 sch msg - pt spouse is aware.

## 2020-08-11 DIAGNOSIS — H353211 Exudative age-related macular degeneration, right eye, with active choroidal neovascularization: Secondary | ICD-10-CM | POA: Diagnosis not present

## 2020-08-11 DIAGNOSIS — D3122 Benign neoplasm of left retina: Secondary | ICD-10-CM | POA: Diagnosis not present

## 2020-08-11 DIAGNOSIS — H43813 Vitreous degeneration, bilateral: Secondary | ICD-10-CM | POA: Diagnosis not present

## 2020-08-11 DIAGNOSIS — H2511 Age-related nuclear cataract, right eye: Secondary | ICD-10-CM | POA: Diagnosis not present

## 2020-08-16 ENCOUNTER — Encounter: Payer: Self-pay | Admitting: *Deleted

## 2020-08-19 ENCOUNTER — Other Ambulatory Visit: Payer: Self-pay

## 2020-08-19 ENCOUNTER — Ambulatory Visit (INDEPENDENT_AMBULATORY_CARE_PROVIDER_SITE_OTHER): Payer: Medicare PPO | Admitting: Surgical

## 2020-08-19 ENCOUNTER — Encounter: Payer: Self-pay | Admitting: Surgical

## 2020-08-19 VITALS — BP 127/80 | HR 77 | Ht 65.5 in | Wt 185.8 lb

## 2020-08-19 DIAGNOSIS — I480 Paroxysmal atrial fibrillation: Secondary | ICD-10-CM

## 2020-08-19 DIAGNOSIS — Z170 Estrogen receptor positive status [ER+]: Secondary | ICD-10-CM

## 2020-08-19 DIAGNOSIS — C50411 Malignant neoplasm of upper-outer quadrant of right female breast: Secondary | ICD-10-CM

## 2020-08-19 DIAGNOSIS — M546 Pain in thoracic spine: Secondary | ICD-10-CM

## 2020-08-19 DIAGNOSIS — G8929 Other chronic pain: Secondary | ICD-10-CM

## 2020-08-19 MED ORDER — CEPHALEXIN 500 MG PO CAPS: 500.0000 mg | ORAL_CAPSULE | Freq: Four times a day (QID) | ORAL | 0 refills | Status: AC

## 2020-08-19 MED ORDER — HYDROCODONE-ACETAMINOPHEN 5-325 MG PO TABS
1.0000 | ORAL_TABLET | Freq: Four times a day (QID) | ORAL | 0 refills | Status: AC | PRN
Start: 2020-08-19 — End: 2020-08-24

## 2020-08-19 MED ORDER — ONDANSETRON HCL 4 MG PO TABS: 4.0000 mg | ORAL_TABLET | Freq: Three times a day (TID) | ORAL | 0 refills | Status: DC | PRN

## 2020-08-19 NOTE — Progress Notes (Signed)
Patient ID: Sheri Sims, female    DOB: 10/22/1950, 70 y.o.   MRN: [id number]  Chief Complaint  Patient presents with  . Pre-op Exam      ICD-10-CM   1. Paroxysmal atrial fibrillation (HCC)  I48.0   2. Malignant neoplasm of upper-outer quadrant of right breast in female, estrogen receptor positive ([place])  C50.411    Z17.0   3. Chronic bilateral thoracic back pain  M54.6    G89.29     History of Present Illness: Sheri Sims is a 70 y.o.  female  with a history of right breast cancer.  She presents for preoperative evaluation for upcoming procedure, Bilateral oncoplastic breast Reduction by Dr. [name] and right breast lumpectomy with radioactive seed localization by Dr. Ninfa Linden with general surgery, scheduled for 09/02/2020.  The patient has not had problems with anesthesia. No history of DVT/PE.  No family history of DVT/PE.  No family or personal history of bleeding or clotting disorders.  Patient is not currently taking any blood thinners -she was previously on Xarelto for A. fib, however after her ablation she has not had any A. fib.  No history of CVA/MI She reports she was advised by cardiology that she can stop taking her Xarelto.  Summary of Previous Visit: Patient was referred to our office by Dr. Ninfa Linden for breast reconstruction.  She was recently diagnosed with right breast cancer after screening mammogram on 12/21.  Diagnostic mammogram and ultrasound showed a 1 cm upper outer breast mass and an 8.7 cm irregular area central to the nipple.  The pathology from the biopsy showed atypical lobular hyperplasia and carcinoma in situ.  It is ER/PR positive and HER-2 negative.  She has a history of atrial fibrillation with an ablation which she takes Xarelto for. She plans to come off this 5 days prior to her surgery.  She reports that she would like to be a large C/small D.**  STN on the right is 38 cm, 34 cm on the left.  IMF distance is 18 cm.  She is planning on partial  mastectomy with postop radiation.  PMH Significant for: Fibromyalgia, MVP, atrial flutter/fibrillation -resolved with ablation.  She also reports a history of SVT, reports no recent episodes.   Past Medical History: Allergies: Allergies  Allergen Reactions  . Codeine Nausea And Vomiting and Nausea Only  . Sulfur Dioxide Nausea Only  . Sulfites Nausea And Vomiting  . Eliquis [Apixaban]     rash  . Codeine Phosphate Nausea And Vomiting  . Other     Prednisone cream caused blisters   . Sulfa Antibiotics Nausea And Vomiting    Current Medications:  Current Outpatient Medications:  .  acetaminophen (TYLENOL) 650 MG CR tablet, Take 650 mg by mouth every 8 (eight) hours as needed for pain. , Disp: , Rfl:  .  Calcium Carbonate (CALCIUM 600 PO), Take 1 tablet by mouth 3 (three) times a week. , Disp: , Rfl:  .  calcium carbonate (TUMS - DOSED IN MG ELEMENTAL CALCIUM) 500 MG chewable tablet, Chew 1 tablet by mouth as needed for indigestion or heartburn. , Disp: , Rfl:  .  cephALEXin (KEFLEX) 500 MG capsule, Take 1 capsule (500 mg total) by mouth 4 (four) times daily for 3 days., Disp: 12 capsule, Rfl: 0 .  Cetirizine HCl 10 MG CAPS, Take 5 mg by mouth at bedtime as needed (allergies). , Disp: , Rfl:  .  cholecalciferol (VITAMIN D) 1000 UNITS tablet,  Take 1,000 Units by mouth every other day., Disp: , Rfl:  .  HYDROcodone-acetaminophen (NORCO) 5-325 MG tablet, Take 1 tablet by mouth every 6 (six) hours as needed for up to 5 days for severe pain., Disp: 20 tablet, Rfl: 0 .  HYDROcodone-homatropine (HYCODAN) 5-1.5 MG/5ML syrup, Take 5 mLs by mouth every 6 (six) hours as needed for cough., Disp: 120 mL, Rfl: 0 .  loratadine (CLARITIN) 10 MG tablet, Take 10 mg by mouth daily as needed for allergies., Disp: , Rfl:  .  metoprolol succinate (TOPROL-XL) 25 MG 24 hr tablet, Take 1.5 tablets (37.5 mg total) by mouth daily., Disp: 135 tablet, Rfl: 3 .  multivitamin (THERAGRAN) per tablet, Take 1 tablet by  mouth every other day., Disp: , Rfl:  .  ondansetron (ZOFRAN) 4 MG tablet, Take 1 tablet (4 mg total) by mouth every 8 (eight) hours as needed for nausea or vomiting., Disp: 20 tablet, Rfl: 0 .  Polyethyl Glycol-Propyl Glycol (SYSTANE ULTRA) 0.4-0.3 % SOLN, Place 1 drop into both eyes in the morning and at bedtime., Disp: , Rfl:  .  Rivaroxaban (XARELTO) 15 MG TABS tablet, , Disp: , Rfl:  .  sodium chloride (OCEAN) 0.65 % SOLN nasal spray, Place 1 spray into both nostrils as needed for congestion., Disp: , Rfl:  .  vitamin B-12 (CYANOCOBALAMIN) 500 MCG tablet, Take 500 mcg by mouth every other day., Disp: , Rfl:   Past Medical Problems: Past Medical History:  Diagnosis Date  . Atrial flutter ([place])   . Costochondritis   . Enthesopathy of hip region   . Family history of breast cancer 07/21/2020  . Fatigue   . Fibromyalgia   . Helicobacter pylori infection   . Insomnia   . MVP (mitral valve prolapse)    OF ANTERIOR MITRAL VALVE LEAFLET  . Obesity   . Paroxysmal atrial fibrillation (HCC)   . Pharyngitis   . Pneumonia   . Sinusitis, chronic     Past Surgical History: Past Surgical History:  Procedure Laterality Date  . APPENDECTOMY    . ATRIAL FIBRILLATION ABLATION N/A 11/06/2019   Procedure: ATRIAL FIBRILLATION ABLATION;  Surgeon: Thompson Grayer, MD;  Location: [facility];  Service: Cardiovascular;  Laterality: N/A;  . CARDIAC CATHETERIZATION  2010   Normal  . STRESS ECHO TEST  05/31/2010   NO ISCHEMIA  . TONSILLECTOMY    . TRANSTHORACIC ECHOCARDIOGRAM  05/16/2010   EF 55-60%    Social History: Social History   Socioeconomic History  . Marital status: Married    Spouse name: Not on file  . Number of children: 2  . Years of education: Not on file  . Highest education level: Not on file  Occupational History  . Occupation: Retired Tourist information centre manager  Tobacco Use  . Smoking status: Never Smoker  . Smokeless tobacco: Never Used  Vaping Use  . Vaping Use: Never used   Substance and Sexual Activity  . Alcohol use: No  . Drug use: No  . Sexual activity: Not on file  Other Topics Concern  . Not on file  Social History Narrative   Lives in [place] with spouse.   Social Determinants of Health   Financial Resource Strain: Not on file  Food Insecurity: No Food Insecurity  . Worried About Charity fundraiser in the Last Year: Never true  . Ran Out of Food in the Last Year: Never true  Transportation Needs: No Transportation Needs  . Lack of Transportation (Medical): No  .  Lack of Transportation (Non-Medical): No  Physical Activity: Not on file  Stress: Not on file  Social Connections: Not on file  Intimate Partner Violence: Not on file    Family History: Family History  Problem Relation Age of Onset  . Breast cancer Mother 55  . Breast cancer Sister 22  . Cancer Maternal Grandmother        metastatic, unknown primary; dx 24  . Breast cancer Cousin        d. 37; maternal cousin    Review of Systems: Review of Systems  Constitutional: Negative.   Respiratory: Negative.   Cardiovascular: Negative.   Gastrointestinal: Negative.   Musculoskeletal: Negative.   Neurological: Negative.     Physical Exam: Vital Signs BP 127/80 (BP Location: Left Arm, Patient Position: Sitting, Cuff Size: Large)   Pulse 77   Ht 5' 5.5" (1.664 m)   Wt 185 lb 12.8 oz (84.3 kg)   SpO2 97%   BMI 30.45 kg/m   Physical Exam  Constitutional:      General: Not in acute distress.    Appearance: Normal appearance. Not ill-appearing.  HENT:     Head: Normocephalic and atraumatic.  Eyes:     Pupils: Pupils are equal, round Neck:     Musculoskeletal: Normal range of motion.  Cardiovascular:     Rate and Rhythm: Normal rate and regular rhythm.     Pulses: Normal pulses.     Heart sounds: Normal heart sounds. No murmur.  Pulmonary:     Effort: Pulmonary effort is normal. No respiratory distress.     Breath sounds: Normal breath sounds. No wheezing.   Abdominal:     General: Abdomen is flat. There is no distension.     Palpations: Abdomen is soft.     Tenderness: There is no abdominal tenderness.  Musculoskeletal: Normal range of motion.  Skin:    General: Skin is warm and dry.     Findings: No erythema or rash.  Neurological:     General: No focal deficit present.     Mental Status: Alert and oriented to person, place, and time. Mental status is at baseline.     Motor: No weakness.  Psychiatric:        Mood and Affect: Mood normal.        Behavior: Behavior normal.    Assessment/Plan: The patient is scheduled for bilateral oncoplastic breast reduction with Dr. [name] after right breast lumpectomy with radioactive seed localization by general surgery/Dr. Ninfa Linden.  Risks, benefits, and alternatives of procedure discussed, questions answered and consent obtained.    Smoking Status: Non-smoker; Counseling Given?  N/A Last Mammogram: Diagnosed with right breast cancer, MRI of bilateral breasts on 07/29/2020  Caprini Score: 7, high; Risk Factors include: Age, BMI greater than 25, and length of planned surgery.  Current breast cancer.  Recommendation for mechanical and pharmacological prophylaxis. Encourage early ambulation.  Patient was previously on Xarelto, she has stopped this due to no longer needing after successful ablation.  Pictures obtained:'@[email or web address]'   Post-op Rx sent to pharmacy: Zofran, Keflex, Norco.  Patient was provided with the breast reduction and General Surgical Risk consent document and Pain Medication Agreement prior to their appointment.  They had adequate time to read through the risk consent documents and Pain Medication Agreement. We also discussed them in person together during this preop appointment. All of their questions were answered to their satisfaction.  Recommended calling if they have any further questions.  Risk consent form and Pain Medication  Agreement to be scanned into patient's chart.  The  risk that can be encountered with breast reduction were discussed and include the following but not limited to these:  Breast asymmetry, fluid accumulation, firmness of the breast, inability to breast feed, loss of nipple or areola, skin loss, decrease or no nipple sensation, fat necrosis of the breast tissue, bleeding, infection, healing delay.  There are risks of anesthesia, changes to skin sensation and injury to nerves or blood vessels.  The muscle can be temporarily or permanently injured.  You may have an allergic reaction to tape, suture, glue, blood products which can result in skin discoloration, swelling, pain, skin lesions, poor healing.  Any of these can lead to the need for revisonal surgery or stage procedures.  A reduction has potential to interfere with diagnostic procedures.  Nipple or breast piercing can increase risks of infection.  This procedure is best done when the breast is fully developed.  Changes in the breast will continue to occur over time.  Pregnancy can alter the outcomes of previous breast reduction surgery, weight gain and weigh loss can also effect the long term appearance.   We discussed there is a risk of positive margins, this would require additional surgery, this may require mastectomy.  She understands the risks associated with radiation postoperatively.  She understands that it could cause delayed healing, wounds, changes in the size and shape of her breast.  We discussed possibility of nipple areolar necrosis, specifically with the right side due to the location of the masses.  Electronically signed by: Carola Rhine Bray Vickerman, PA-C 08/19/2020 10:25 AM

## 2020-08-19 NOTE — H&P (View-Only) (Signed)
Patient ID: Sheri Sims, female    DOB: [birth year], 70 y.o.   MRN: [id number]  Chief Complaint  Patient presents with  . Pre-op Exam      ICD-10-CM   1. Paroxysmal atrial fibrillation (HCC)  I48.0   2. Malignant neoplasm of upper-outer quadrant of right breast in female, estrogen receptor positive ([place])  C50.411    Z17.0   3. Chronic bilateral thoracic back pain  M54.6    G89.29     History of Present Illness: Sheri Sims is a 70 y.o.  female  with a history of right breast cancer.  She presents for preoperative evaluation for upcoming procedure, Bilateral oncoplastic breast Reduction by Dr. [name] and right breast lumpectomy with radioactive seed localization by Dr. Ninfa Linden with general surgery, scheduled for 09/02/2020.  The patient has not had problems with anesthesia. No history of DVT/PE.  No family history of DVT/PE.  No family or personal history of bleeding or clotting disorders.  Patient is not currently taking any blood thinners -she was previously on Xarelto for A. fib, however after her ablation she has not had any A. fib.  No history of CVA/MI She reports she was advised by cardiology that she can stop taking her Xarelto.  Summary of Previous Visit: Patient was referred to our office by Dr. Ninfa Linden for breast reconstruction.  She was recently diagnosed with right breast cancer after screening mammogram on 12/21.  Diagnostic mammogram and ultrasound showed a 1 cm upper outer breast mass and an 8.7 cm irregular area central to the nipple.  The pathology from the biopsy showed atypical lobular hyperplasia and carcinoma in situ.  It is ER/PR positive and HER-2 negative.  She has a history of atrial fibrillation with an ablation which she takes Xarelto for. She plans to come off this 5 days prior to her surgery.  She reports that she would like to be a large C/small D.**  STN on the right is 38 cm, 34 cm on the left.  IMF distance is 18 cm.  She is planning on partial  mastectomy with postop radiation.  PMH Significant for: Fibromyalgia, MVP, atrial flutter/fibrillation -resolved with ablation.  She also reports a history of SVT, reports no recent episodes.   Past Medical History: Allergies: Allergies  Allergen Reactions  . Codeine Nausea And Vomiting and Nausea Only  . Sulfur Dioxide Nausea Only  . Sulfites Nausea And Vomiting  . Eliquis [Apixaban]     rash  . Codeine Phosphate Nausea And Vomiting  . Other     Prednisone cream caused blisters   . Sulfa Antibiotics Nausea And Vomiting    Current Medications:  Current Outpatient Medications:  .  acetaminophen (TYLENOL) 650 MG CR tablet, Take 650 mg by mouth every 8 (eight) hours as needed for pain. , Disp: , Rfl:  .  Calcium Carbonate (CALCIUM 600 PO), Take 1 tablet by mouth 3 (three) times a week. , Disp: , Rfl:  .  calcium carbonate (TUMS - DOSED IN MG ELEMENTAL CALCIUM) 500 MG chewable tablet, Chew 1 tablet by mouth as needed for indigestion or heartburn. , Disp: , Rfl:  .  cephALEXin (KEFLEX) 500 MG capsule, Take 1 capsule (500 mg total) by mouth 4 (four) times daily for 3 days., Disp: 12 capsule, Rfl: 0 .  Cetirizine HCl 10 MG CAPS, Take 5 mg by mouth at bedtime as needed (allergies). , Disp: , Rfl:  .  cholecalciferol (VITAMIN D) 1000 UNITS tablet,  Take 1,000 Units by mouth every other day., Disp: , Rfl:  .  HYDROcodone-acetaminophen (NORCO) 5-325 MG tablet, Take 1 tablet by mouth every 6 (six) hours as needed for up to 5 days for severe pain., Disp: 20 tablet, Rfl: 0 .  HYDROcodone-homatropine (HYCODAN) 5-1.5 MG/5ML syrup, Take 5 mLs by mouth every 6 (six) hours as needed for cough., Disp: 120 mL, Rfl: 0 .  loratadine (CLARITIN) 10 MG tablet, Take 10 mg by mouth daily as needed for allergies., Disp: , Rfl:  .  metoprolol succinate (TOPROL-XL) 25 MG 24 hr tablet, Take 1.5 tablets (37.5 mg total) by mouth daily., Disp: 135 tablet, Rfl: 3 .  multivitamin (THERAGRAN) per tablet, Take 1 tablet by  mouth every other day., Disp: , Rfl:  .  ondansetron (ZOFRAN) 4 MG tablet, Take 1 tablet (4 mg total) by mouth every 8 (eight) hours as needed for nausea or vomiting., Disp: 20 tablet, Rfl: 0 .  Polyethyl Glycol-Propyl Glycol (SYSTANE ULTRA) 0.4-0.3 % SOLN, Place 1 drop into both eyes in the morning and at bedtime., Disp: , Rfl:  .  Rivaroxaban (XARELTO) 15 MG TABS tablet, , Disp: , Rfl:  .  sodium chloride (OCEAN) 0.65 % SOLN nasal spray, Place 1 spray into both nostrils as needed for congestion., Disp: , Rfl:  .  vitamin B-12 (CYANOCOBALAMIN) 500 MCG tablet, Take 500 mcg by mouth every other day., Disp: , Rfl:   Past Medical Problems: Past Medical History:  Diagnosis Date  . Atrial flutter ([place])   . Costochondritis   . Enthesopathy of hip region   . Family history of breast cancer 07/21/2020  . Fatigue   . Fibromyalgia   . Helicobacter pylori infection   . Insomnia   . MVP (mitral valve prolapse)    OF ANTERIOR MITRAL VALVE LEAFLET  . Obesity   . Paroxysmal atrial fibrillation (HCC)   . Pharyngitis   . Pneumonia   . Sinusitis, chronic     Past Surgical History: Past Surgical History:  Procedure Laterality Date  . APPENDECTOMY    . ATRIAL FIBRILLATION ABLATION N/A 11/06/2019   Procedure: ATRIAL FIBRILLATION ABLATION;  Surgeon: Thompson Grayer, MD;  Location: [facility];  Service: Cardiovascular;  Laterality: N/A;  . CARDIAC CATHETERIZATION  2010   Normal  . STRESS ECHO TEST  05/31/2010   NO ISCHEMIA  . TONSILLECTOMY    . TRANSTHORACIC ECHOCARDIOGRAM  05/16/2010   EF 55-60%    Social History: Social History   Socioeconomic History  . Marital status: Married    Spouse name: Not on file  . Number of children: 2  . Years of education: Not on file  . Highest education level: Not on file  Occupational History  . Occupation: Retired Tourist information centre manager  Tobacco Use  . Smoking status: Never Smoker  . Smokeless tobacco: Never Used  Vaping Use  . Vaping Use: Never used   Substance and Sexual Activity  . Alcohol use: No  . Drug use: No  . Sexual activity: Not on file  Other Topics Concern  . Not on file  Social History Narrative   Lives in [place] with spouse.   Social Determinants of Health   Financial Resource Strain: Not on file  Food Insecurity: No Food Insecurity  . Worried About Charity fundraiser in the Last Year: Never true  . Ran Out of Food in the Last Year: Never true  Transportation Needs: No Transportation Needs  . Lack of Transportation (Medical): No  .  Lack of Transportation (Non-Medical): No  Physical Activity: Not on file  Stress: Not on file  Social Connections: Not on file  Intimate Partner Violence: Not on file    Family History: Family History  Problem Relation Age of Onset  . Breast cancer Mother 2  . Breast cancer Sister 58  . Cancer Maternal Grandmother        metastatic, unknown primary; dx 40  . Breast cancer Cousin        d. 99; maternal cousin    Review of Systems: Review of Systems  Constitutional: Negative.   Respiratory: Negative.   Cardiovascular: Negative.   Gastrointestinal: Negative.   Musculoskeletal: Negative.   Neurological: Negative.     Physical Exam: Vital Signs BP 127/80 (BP Location: Left Arm, Patient Position: Sitting, Cuff Size: Large)   Pulse 77   Ht 5' 5.5" (1.664 m)   Wt 185 lb 12.8 oz (84.3 kg)   SpO2 97%   BMI 30.45 kg/m   Physical Exam  Constitutional:      General: Not in acute distress.    Appearance: Normal appearance. Not ill-appearing.  HENT:     Head: Normocephalic and atraumatic.  Eyes:     Pupils: Pupils are equal, round Neck:     Musculoskeletal: Normal range of motion.  Cardiovascular:     Rate and Rhythm: Normal rate and regular rhythm.     Pulses: Normal pulses.     Heart sounds: Normal heart sounds. No murmur.  Pulmonary:     Effort: Pulmonary effort is normal. No respiratory distress.     Breath sounds: Normal breath sounds. No wheezing.   Abdominal:     General: Abdomen is flat. There is no distension.     Palpations: Abdomen is soft.     Tenderness: There is no abdominal tenderness.  Musculoskeletal: Normal range of motion.  Skin:    General: Skin is warm and dry.     Findings: No erythema or rash.  Neurological:     General: No focal deficit present.     Mental Status: Alert and oriented to person, place, and time. Mental status is at baseline.     Motor: No weakness.  Psychiatric:        Mood and Affect: Mood normal.        Behavior: Behavior normal.    Assessment/Plan: The patient is scheduled for bilateral oncoplastic breast reduction with Dr. [name] after right breast lumpectomy with radioactive seed localization by general surgery/Dr. Ninfa Linden.  Risks, benefits, and alternatives of procedure discussed, questions answered and consent obtained.    Smoking Status: Non-smoker; Counseling Given?  N/A Last Mammogram: Diagnosed with right breast cancer, MRI of bilateral breasts on 07/29/2020  Caprini Score: 7, high; Risk Factors include: Age, BMI greater than 25, and length of planned surgery.  Current breast cancer.  Recommendation for mechanical and pharmacological prophylaxis. Encourage early ambulation.  Patient was previously on Xarelto, she has stopped this due to no longer needing after successful ablation.  Pictures obtained:'@Consult'   Post-op Rx sent to pharmacy: Zofran, Keflex, Norco.  Patient was provided with the breast reduction and General Surgical Risk consent document and Pain Medication Agreement prior to their appointment.  They had adequate time to read through the risk consent documents and Pain Medication Agreement. We also discussed them in person together during this preop appointment. All of their questions were answered to their satisfaction.  Recommended calling if they have any further questions.  Risk consent form and Pain Medication  Agreement to be scanned into patient's chart.  The  risk that can be encountered with breast reduction were discussed and include the following but not limited to these:  Breast asymmetry, fluid accumulation, firmness of the breast, inability to breast feed, loss of nipple or areola, skin loss, decrease or no nipple sensation, fat necrosis of the breast tissue, bleeding, infection, healing delay.  There are risks of anesthesia, changes to skin sensation and injury to nerves or blood vessels.  The muscle can be temporarily or permanently injured.  You may have an allergic reaction to tape, suture, glue, blood products which can result in skin discoloration, swelling, pain, skin lesions, poor healing.  Any of these can lead to the need for revisonal surgery or stage procedures.  A reduction has potential to interfere with diagnostic procedures.  Nipple or breast piercing can increase risks of infection.  This procedure is best done when the breast is fully developed.  Changes in the breast will continue to occur over time.  Pregnancy can alter the outcomes of previous breast reduction surgery, weight gain and weigh loss can also effect the long term appearance.   We discussed there is a risk of positive margins, this would require additional surgery, this may require mastectomy.  She understands the risks associated with radiation postoperatively.  She understands that it could cause delayed healing, wounds, changes in the size and shape of her breast.  We discussed possibility of nipple areolar necrosis, specifically with the right side due to the location of the masses.  Electronically signed by: Carola Rhine Scheeler, PA-C 08/19/2020 10:25 AM

## 2020-08-27 NOTE — Progress Notes (Signed)
Surgical Instructions    Your procedure is scheduled on 09/02/20.  Report to [facility] Main Entrance "A" at 10:00 A.M., then check in with the Admitting office.  Call this number if you have problems the morning of surgery:  [phone]   If you have any questions prior to your surgery date call [phone]: Open Monday-Friday 8am-4pm    Remember:  Do not eat after midnight the night before your surgery  You may drink clear liquids until 09:00am the morning of your surgery.   Clear liquids allowed are: Water, Non-Citrus Juices (without pulp), Carbonated Beverages, Clear Tea, Black Coffee Only, and Gatorade  Patient Instructions  . The night before surgery:  o No food after midnight. ONLY clear liquids after midnight  . The day of surgery (if you do NOT have diabetes):  o Drink ONE (1) Pre-Surgery Clear Ensure by 09:00am the morning of surgery. Drink in one sitting. Do not sip.  o This drink was given to you during your hospital  pre-op appointment visit. o Nothing else to drink after completing the  Pre-Surgery Clear Ensure.      Take these medicines the morning of surgery with A SIP OF WATER  cephALEXin (KEFLEX)  HYDROcodone-homatropine (HYCODAN) as needed for cough loratadine (CLARITIN) if needed metoprolol succinate (TOPROL-XL)    As of today, STOP taking any Aspirin (unless otherwise instructed by your surgeon) Aleve, Naproxen, Ibuprofen, Motrin, Advil, Goody's, BC's, all herbal medications, fish oil, and all vitamins.  Stop taking rivaroxaban (Xarelto) 3 days prior to surgery. Last dose will be  08/29/20.                     Do not wear jewelry, make up, or nail polish            Do not wear lotions, powders, perfumes/colognes, or deodorant.            Do not shave 48 hours prior to surgery.              Do not bring valuables to the hospital.            [facility] is not responsible for any belongings or valuables.  Do NOT Smoke (Tobacco/Vaping) or drink Alcohol  24 hours prior to your procedure If you use a CPAP at night, you may bring all equipment for your overnight stay.   Contacts, glasses, dentures or bridgework may not be worn into surgery, please bring cases for these belongings   For patients admitted to the hospital, discharge time will be determined by your treatment team.   Patients discharged the day of surgery will not be allowed to drive home, and someone needs to stay with them for 24 hours.    Special instructions:   [facility]- Preparing For Surgery  Before surgery, you can play an important role. Because skin is not sterile, your skin needs to be as free of germs as possible. You can reduce the number of germs on your skin by washing with CHG (chlorahexidine gluconate) Soap before surgery.  CHG is an antiseptic cleaner which kills germs and bonds with the skin to continue killing germs even after washing.    Oral Hygiene is also important to reduce your risk of infection.  Remember - BRUSH YOUR TEETH THE MORNING OF SURGERY WITH YOUR REGULAR TOOTHPASTE  Please do not use if you have an allergy to CHG or antibacterial soaps. If your skin becomes reddened/irritated stop using the CHG.  Do  not shave (including legs and underarms) for at least 48 hours prior to first CHG shower. It is OK to shave your face.  Please follow these instructions carefully.   1. Shower the NIGHT BEFORE SURGERY and the MORNING OF SURGERY  2. If you chose to wash your hair, wash your hair first as usual with your normal shampoo.  3. After you shampoo, rinse your hair and body thoroughly to remove the shampoo.  4. Wash Face and genitals (private parts) with your normal soap.   5.  Shower the NIGHT BEFORE SURGERY and the MORNING OF SURGERY with CHG Soap.   6. Use CHG Soap as you would any other liquid soap. You can apply CHG directly to the skin and wash gently with a scrungie or a clean washcloth.   7. Apply the CHG Soap to your body ONLY FROM THE  NECK DOWN.  Do not use on open wounds or open sores. Avoid contact with your eyes, ears, mouth and genitals (private parts). Wash Face and genitals (private parts)  with your normal soap.   8. Wash thoroughly, paying special attention to the area where your surgery will be performed.  9. Thoroughly rinse your body with warm water from the neck down.  10. DO NOT shower/wash with your normal soap after using and rinsing off the CHG Soap.  11. Pat yourself dry with a CLEAN TOWEL.  12. Wear CLEAN PAJAMAS to bed the night before surgery  13. Place CLEAN SHEETS on your bed the night before your surgery  14. DO NOT SLEEP WITH PETS.   Day of Surgery: Take a shower.  Wear Clean/Comfortable clothing the morning of surgery Do not apply any deodorants/lotions.   Remember to brush your teeth WITH YOUR REGULAR TOOTHPASTE.   Please read over the following fact sheets that you were given.

## 2020-08-30 ENCOUNTER — Telehealth: Payer: Self-pay | Admitting: Surgical

## 2020-08-30 ENCOUNTER — Encounter (HOSPITAL_COMMUNITY)
Admission: RE | Admit: 2020-08-30 | Discharge: 2020-08-30 | Disposition: A | Discharge status: Home / Self Care | Payer: Medicare PPO | Source: Ambulatory Visit | Attending: Surgery | Admitting: Surgery

## 2020-08-30 ENCOUNTER — Other Ambulatory Visit (HOSPITAL_COMMUNITY)
Admission: RE | Admit: 2020-08-30 | Discharge: 2020-08-30 | Disposition: A | Discharge status: Home / Self Care | Payer: Medicare PPO | Source: Ambulatory Visit | Attending: Surgery | Admitting: Surgery

## 2020-08-30 ENCOUNTER — Encounter (HOSPITAL_COMMUNITY): Payer: Self-pay

## 2020-08-30 ENCOUNTER — Other Ambulatory Visit: Payer: Self-pay

## 2020-08-30 DIAGNOSIS — C50911 Malignant neoplasm of unspecified site of right female breast: Secondary | ICD-10-CM | POA: Insufficient documentation

## 2020-08-30 DIAGNOSIS — M797 Fibromyalgia: Secondary | ICD-10-CM | POA: Diagnosis not present

## 2020-08-30 DIAGNOSIS — Z7901 Long term (current) use of anticoagulants: Secondary | ICD-10-CM | POA: Insufficient documentation

## 2020-08-30 DIAGNOSIS — I480 Paroxysmal atrial fibrillation: Secondary | ICD-10-CM | POA: Insufficient documentation

## 2020-08-30 DIAGNOSIS — Z01812 Encounter for preprocedural laboratory examination: Secondary | ICD-10-CM | POA: Insufficient documentation

## 2020-08-30 DIAGNOSIS — Z20822 Contact with and (suspected) exposure to covid-19: Secondary | ICD-10-CM | POA: Insufficient documentation

## 2020-08-30 DIAGNOSIS — Z79899 Other long term (current) drug therapy: Secondary | ICD-10-CM | POA: Insufficient documentation

## 2020-08-30 HISTORY — DX: Unspecified osteoarthritis, unspecified site: M19.90

## 2020-08-30 HISTORY — DX: Malignant (primary) neoplasm, unspecified: C80.1

## 2020-08-30 LAB — CBC
HCT: 47.1 % — ABNORMAL HIGH (ref 36.0–46.0)
Hemoglobin: 14.7 g/dL (ref 12.0–15.0)
MCH: 31.4 pg (ref 26.0–34.0)
MCHC: 31.2 g/dL (ref 30.0–36.0)
MCV: 100.6 fL — ABNORMAL HIGH (ref 80.0–100.0)
Platelets: 238 10*3/uL (ref 150–400)
RBC: 4.68 MIL/uL (ref 3.87–5.11)
RDW: 12.5 % (ref 11.5–15.5)
WBC: 3.8 10*3/uL — ABNORMAL LOW (ref 4.0–10.5)
nRBC: 0 % (ref 0.0–0.2)

## 2020-08-30 LAB — BASIC METABOLIC PANEL
Anion gap: 7 (ref 5–15)
BUN: 11 mg/dL (ref 8–23)
CO2: 28 mmol/L (ref 22–32)
Calcium: 9.2 mg/dL (ref 8.9–10.3)
Chloride: 105 mmol/L (ref 98–111)
Creatinine, Ser: 0.63 mg/dL (ref 0.44–1.00)
GFR, Estimated: >60 mL/min (ref >60)
Glucose, Bld: 71 mg/dL (ref 70–99)
Potassium: 4.3 mmol/L (ref 3.5–5.1)
Sodium: 140 mmol/L (ref 135–145)

## 2020-08-30 NOTE — Telephone Encounter (Signed)
Patient picked up her antibiotic and wasn't sure if she should take that medication before or after surgery. Please call to confirm that this should be taken after surgery.

## 2020-08-30 NOTE — Progress Notes (Signed)
PCP - Dr. Bing Matter Cardiologist - Dr. Caryl Comes   Chest x-ray - n/a EKG - 06/07/20 Stress Test - 06/13/18 ECHO - 11/03/19 Cardiac Cath -2010   Sleep Study - pt states borderline study in the past. Did not require a CPAP. CPAP - denies  Blood Thinner Instructions: Xarelto; hold 3 days. LD 08/28/20 Aspirin Instructions:n/a  ERAS Protcol -Stop clear liquids by 0900 DOS. PRE-SURGERY Ensure or G2- Pt given (1) Pre-surgical Ensure Drink to complete by 0900.  COVID TEST- 08/30/20; pt aware to quarantine after testing.   Anesthesia review: Yes, heart history.   Patient denies shortness of breath, fever, cough and chest pain at PAT appointment   All instructions explained to the patient, with a verbal understanding of the material. Patient agrees to go over the instructions while at home for a better understanding. Patient also instructed to self quarantine after being tested for COVID-19. The opportunity to ask questions was provided.

## 2020-08-31 LAB — SARS CORONAVIRUS 2 (TAT 6-24 HRS): SARS Coronavirus 2: NEGATIVE

## 2020-08-31 NOTE — Anesthesia Preprocedure Evaluation (Addendum)
Anesthesia Evaluation  Patient identified by MRN, date of birth, ID band Patient awake    Reviewed: Allergy & Precautions, NPO status , Patient's Chart, lab work & pertinent test results, reviewed documented beta blocker date and time   History of Anesthesia Complications Negative for: history of anesthetic complications  Airway Mallampati: II  TM Distance: >3 FB Neck ROM: Full    Dental  (+) Dental Advisory Given, Teeth Intact   Pulmonary neg pulmonary ROS,  08/30/2020 SARS coronavirus neg   breath sounds clear to auscultation       Cardiovascular hypertension, Pt. on medications and Pt. on home beta blockers (-) angina+ dysrhythmias Atrial Fibrillation and Supra Ventricular Tachycardia  Rhythm:Regular Rate:Normal  11/2019 ECHO: EF 60-65%, normal wall motion, trivial MR   Neuro/Psych Anxiety negative neurological ROS     GI/Hepatic Neg liver ROS, GERD  Controlled,  Endo/Other  negative endocrine ROS  Renal/GU negative Renal ROS     Musculoskeletal  (+) Arthritis , Fibromyalgia -  Abdominal (+) - obese,   Peds  Hematology Xarelto: last dose 2 weeks ago   Anesthesia Other Findings   Reproductive/Obstetrics                             Anesthesia Physical Anesthesia Plan  ASA: III  Anesthesia Plan: General   Post-op Pain Management:    Induction: Intravenous  PONV Risk Score and Plan: 3 and Ondansetron, Dexamethasone and Treatment may vary due to age or medical condition  Airway Management Planned: Oral ETT  Additional Equipment: None  Intra-op Plan:   Post-operative Plan: Extubation in OR  Informed Consent: I have reviewed the patients History and Physical, chart, labs and discussed the procedure including the risks, benefits and alternatives for the proposed anesthesia with the patient or authorized representative who has indicated his/her understanding and acceptance.      Dental advisory given  Plan Discussed with: CRNA and Surgeon  Anesthesia Plan Comments: (PAT note written 08/31/2020 by Myra Gianotti, PA-C. )       Anesthesia Quick Evaluation

## 2020-08-31 NOTE — Progress Notes (Signed)
Anesthesia Chart Review:  Case: [id number] Date/Time: 09/02/20 1140   Procedures:      RIGHT BREAST LUMPECTOMY WITH RADIOACTIVE SEED LOCALIZATION (Right Breast) - LMA, COORD CASE W/ [place]-NEEDS 2 HOURS [place] NEEDS 1 HOUR     BILATERAL ONCOPLASTIC BREAST REDUCTION (Bilateral )   Anesthesia type: General   Pre-op diagnosis: RIGHT BREAST CANCER   Location: [place] OR ROOM 09 / [place] OR   Surgeons: Coralie Keens, MD; Wallace Going, DO     Special Needs: ERAS, LMA, Supine, [place] 09/01/20 @ 8:45 AM  DISCUSSION: Patient is a 70 year old female scheduled for the above procedure.   History includes never smoker, PAF/atrial flutter (s/p ablation 11/06/19), MVP (structrually normal MV, trivial MR 11/03/19 echo), fibromyalgia, breast cancer (right ~07/2020). Normal coronaries in 2010, low risk stress test in 2019.   Last visit with EP cardiologist Dr. Rayann Heman 06/07/20. She was doing well post ablation, off AAD therapy.  CHA2DS2-VASc score of 2.  On Xarelto (had rash, possibly related to Eliquis). He discussed guidelines that would support her stopping anticoagulation, but she was reluctant to make changes. Follow-up with Dr. Caryl Comes ~ 08/2020 and Dr. Rayann Heman ~ 03/2021 planned. She reported instructions to hold Xarelto for 3 days, last dose 08/28/20.  08/30/20 presurgical COVID-19 tet was negative. Anesthesia team to evaluate on the day of surgery.    VS: BP 136/84   Pulse 79   Temp 36.6 C (Oral)   Resp 17   Ht 5' 5.5" (1.664 m)   Wt 83.4 kg   SpO2 99%   BMI 30.14 kg/m     PROVIDERS: Aletha Halim., PA-C is PCP  Virl Axe, MD is EP cardiologist Christinia Gully, MD is pulmonologist. Last visit 03/19/20 to review 03/15/20 chest CT (follow-up infectious/inflammatory lung nodules). Six month follow-up with CXR planned.   Nicholas Lose, MD is HEM-ONC Gery Pray, MD is RAD-ONC   LABS: Labs reviewed: Acceptable for surgery. (all labs ordered are listed, but only abnormal results are  displayed)  Labs Reviewed  CBC - Abnormal; Notable for the following components:      Result Value   WBC 3.8 (*)    HCT 47.1 (*)    MCV 100.6 (*)    All other components within normal limits  BASIC METABOLIC PANEL     IMAGES: CT Chest 03/15/20: IMPRESSION: 1. Clustered solid pulmonary nodules in a branching configuration in the posterior right lower lobe, new in the short interval since 11/03/2019 CT, probably infectious/inflammatory. Follow-up chest CT recommended in 3-6 months. 2. Previously described clustered solid nodules in the medial left lower lobe are decreased since 11/03/2019 CT, compatible with resolving benign inflammatory nodularity. 3. One vessel coronary atherosclerosis. 4. Aortic Atherosclerosis (ICD10-I70.0).   EKG: 06/07/20: SR with PACs   CV: Echo 11/03/19: IMPRESSIONS  1. Left ventricular ejection fraction, by estimation, is 60 to 65%. The  left ventricle has normal function. The left ventricle has no regional  wall motion abnormalities. Left ventricular diastolic parameters are  consistent with Grade I diastolic  dysfunction (impaired relaxation).  2. Right ventricular systolic function is normal. The right ventricular  size is normal. There is normal pulmonary artery systolic pressure. The  estimated right ventricular systolic pressure is 07.3 mmHg.  3. Left atrial size was mildly dilated.  4. The mitral valve is normal in structure. Trivial mitral valve  regurgitation. No evidence of mitral stenosis.  5. The aortic valve is tricuspid. Aortic valve regurgitation is not  visualized. No aortic stenosis  is present.  6. The inferior vena cava is normal in size with greater than 50%  respiratory variability, suggesting right atrial pressure of 3 mmHg.    CT Coronary 11/03/19: IMPRESSION: 1. There is normal pulmonary vein drainage into the left atrium. 2. The left atrial appendage is large chicken wing with two lobes and ostial size 11.7 cm x  20.5 mm and length 37 mm. There is no thrombus in the left atrial appendage. 3. The esophagus runs in the left atrial midline and is not in the proximity to any of the pulmonary veins. 4. Coronary calcium score 13.3%. This is 44th percentile compared to age/sex matched controls.   ETT 06/13/18:  Blood pressure demonstrated a normal response to exercise.  Nonspecific ST abnormality noted in the inferolateral leads during exercise.  No exercise induced arrhythmias on flecainide  No significant changes in PR or QRS interval during exercise.  Submaximal stress test inadequate for evaluation of ischemia.   Cardiac event monitor 07/19/17-08/03/17: Study Highlights  Normal sinus rhythm with sinus arrhythmia  PACs  Episode of atrial fibrillation on 07/28/17 at 11:54. rate 100  Symptoms of tachycardia, fluttering do not correlate with arrhythmia.  (S/p EP study, ablation of atrial fibrillation with additional mapping and ablation of a second discrete focus (atrial flutter)  as well as additional 3D mapping ablation within the right atrium for afib treatment 11/06/19)   Nuclear stress test 07/18/17:  The left ventricular ejection fraction is hyperdynamic (>65%).  Nuclear stress EF: 70%.  The study is normal.  This is a low risk study.  Blood pressure demonstrated a normal response to exercise.  There was no ST segment deviation noted during stress.    Cardiac cath 08/10/08:  OVERALL IMPRESSION:  1. Normal left ventricular function.  2. Normal coronary arteries.   Past Medical History:  Diagnosis Date  . Arthritis   . Atrial flutter ([place])   . Cancer [facility])    breast cancer  . Costochondritis   . Enthesopathy of hip region   . Family history of breast cancer 07/21/2020  . Fatigue   . Fibromyalgia   . Helicobacter pylori infection   . Insomnia   . MVP (mitral valve prolapse)    OF ANTERIOR MITRAL VALVE LEAFLET  . Obesity   . Paroxysmal atrial fibrillation ([facility])   .  Pharyngitis   . Pneumonia   . Sinusitis, chronic     Past Surgical History:  Procedure Laterality Date  . ABDOMINAL HYSTERECTOMY    . APPENDECTOMY    . ATRIAL FIBRILLATION ABLATION N/A 11/06/2019   Procedure: ATRIAL FIBRILLATION ABLATION;  Surgeon: Thompson Grayer, MD;  Location: [place] [facility];  Service: Cardiovascular;  Laterality: N/A;  . CARDIAC CATHETERIZATION  2010   Normal  . CHOLECYSTECTOMY    . STRESS ECHO TEST  05/31/2010   NO ISCHEMIA  . TONSILLECTOMY    . TRANSTHORACIC ECHOCARDIOGRAM  05/16/2010   EF 55-60%    MEDICATIONS: . acetaminophen (TYLENOL) 650 MG CR tablet  . Calcium Carbonate (CALCIUM 600 PO)  . calcium carbonate (TUMS - DOSED IN MG ELEMENTAL CALCIUM) 500 MG chewable tablet  . cephALEXin (KEFLEX) 500 MG capsule  . cetirizine (ZYRTEC) 10 MG tablet  . cholecalciferol (VITAMIN D) 1000 UNITS tablet  . HYDROcodone-homatropine (HYCODAN) 5-1.5 MG/5ML syrup  . loratadine (CLARITIN) 10 MG tablet  . metoprolol succinate (TOPROL-XL) 25 MG 24 hr tablet  . multivitamin (THERAGRAN) per tablet  . ondansetron (ZOFRAN) 4 MG tablet  . Polyethyl Glycol-Propyl Glycol (  SYSTANE ULTRA) 0.4-0.3 % SOLN  . rivaroxaban (XARELTO) 20 MG TABS tablet  . sodium chloride (OCEAN) 0.65 % SOLN nasal spray  . tobramycin (TOBREX) 0.3 % ophthalmic solution  . vitamin B-12 (CYANOCOBALAMIN) 500 MCG tablet   No current facility-administered medications for this encounter.    Myra Gianotti, PA-C Surgical Short Stay/Anesthesiology [facility] Phone [phone] [facility] Phone [phone] 08/31/2020 4:29 PM

## 2020-08-31 NOTE — Telephone Encounter (Signed)
Called and spoke with the patient regarding the message below.  Informed the patient that I spoke with [facility] and he stated she's to start taking the antibiotic the day after surgery.  Patient verbalized understanding and agreed.//AB/CMA

## 2020-09-01 DIAGNOSIS — C50411 Malignant neoplasm of upper-outer quadrant of right female breast: Secondary | ICD-10-CM | POA: Diagnosis not present

## 2020-09-01 DIAGNOSIS — N6081 Other benign mammary dysplasias of right breast: Secondary | ICD-10-CM | POA: Diagnosis not present

## 2020-09-01 NOTE — H&P (Signed)
Sheri Sims  Location: [facility] Patient #: [id number] DOB: October 15, 1950 Undefined / Language: Cleophus Molt / Race: White Female   History of Present Illness  The patient is a 70 year old female who presents with breast cancer. Chief complaint: Right breast cancer:  This is a pleasant 70 year old female who was found to have several abnormal areas with distortion in the right breast on recent screening mammography. She underwent 3 separate biopsies. 1 showed atypical lobular hyperplasia. Another area showed invasive and in situ ductal carcinoma. This 1 was 95% ER and PR positive, HER2 negative, with a Ki-67 of 10%. A third area was later biopsied and is just now shown invasive ductal carcinoma. Receptor status is pending. The total size of the area in the right breast is approximately 3 to 4 cm. She has had no previous problems regarding her breast. She denies nipple discharge. Her family history is significant for a mother and sister with breast cancer. She has just come off her anticoagulation medicine after being ablated for A. fib. She is currently otherwise without complaints.    Past Surgical History Appendectomy  Gallbladder Surgery - Laparoscopic  Hysterectomy (due to cancer) - Partial  Tonsillectomy   Diagnostic Studies History  Colonoscopy  1-5 years ago Mammogram  within last year Pap Smear  1-5 years ago  Medication History  Medications Reconciled  Social History  No alcohol use  No caffeine use  No drug use  Tobacco use  Never smoker.  Family History  Breast Cancer  Family Members In General, Mother, Sister. Cancer  Family Members In General.  Pregnancy / Birth History  Age at menarche  53 years. Age of menopause  <43 Contraceptive History  Oral contraceptives. Gravida  2 Maternal age  28-25 Para  2 Regular periods   Other Problems Arthritis  Chest pain  Cholelithiasis  Heart murmur  Hypercholesterolemia      Review of Systems  General Present- Fatigue. Not Present- Appetite Loss, Chills, Fever, Night Sweats, Weight Gain and Weight Loss. Skin Present- Rash. Not Present- Change in Wart/Mole, Dryness, Hives, Jaundice, New Lesions, Non-Healing Wounds and Ulcer. HEENT Present- Hearing Loss, Ringing in the Ears, Seasonal Allergies, Visual Disturbances and Wears glasses/contact lenses. Not Present- Earache, Hoarseness, Nose Bleed, Oral Ulcers, Sinus Pain, Sore Throat and Yellow Eyes. Respiratory Not Present- Bloody sputum, Chronic Cough, Difficulty Breathing, Snoring and Wheezing. Breast Not Present- Breast Mass, Breast Pain, Nipple Discharge and Skin Changes. Cardiovascular Present- Rapid Heart Rate and Swelling of Extremities. Not Present- Chest Pain, Difficulty Breathing Lying Down, Leg Cramps, Palpitations and Shortness of Breath. Gastrointestinal Present- Change in Bowel Habits. Not Present- Abdominal Pain, Bloating, Bloody Stool, Chronic diarrhea, Constipation, Difficulty Swallowing, Excessive gas, Gets full quickly at meals, Hemorrhoids, Indigestion, Nausea, Rectal Pain and Vomiting. Female Genitourinary Present- Urgency. Not Present- Frequency, Nocturia, Painful Urination and Pelvic Pain. Musculoskeletal Present- Joint Pain and Swelling of Extremities. Not Present- Back Pain, Joint Stiffness, Muscle Pain and Muscle Weakness. Neurological Not Present- Decreased Memory, Fainting, Headaches, Numbness, Seizures, Tingling, Tremor, Trouble walking and Weakness. Psychiatric Not Present- Anxiety, Bipolar, Change in Sleep Pattern, Depression, Fearful and Frequent crying. Endocrine Not Present- Cold Intolerance, Excessive Hunger, Hair Changes, Heat Intolerance, Hot flashes and New Diabetes. Hematology Present- Blood Thinners. Not Present- Easy Bruising, Excessive bleeding, Gland problems, HIV and Persistent Infections. All other systems negative   Physical Exam  The physical exam findings are as  follows: Note: Generally, she is awake and alert and appears well  She has very  large breast bilaterally. The nipple areolar complexes are normal. She has mild scarring and ecchymosis from her biopsies of the right breast but no palpable masses. There is no axillary adenopathy on either side. There is no nipple discharge    Assessment & Plan   BREAST CANCER, RIGHT (C50.911)  Impression: I have reviewed her notes in the electronic medical records as well as her previous medical history. We have discussed her in our multidisciplinary breast cancer conference this morning as well This is a patient with multifocal right breast cancer. The 2 areas of concern are close together in the upper outer quadrant of the right breast. She also has the area of atypical lobular hyperplasia. I had a long discussion with the patient and her family regarding the diagnosis of breast cancer. We discussed long-term outcomes with both conservative breast management versus mastectomy. She is interested in breast conservation. Given her overall size of the breast, the multifocal malignancy, etc. she is interested in seeing plastic surgeon to consider oncoplastic reduction. I had a discussion with her regarding breast conservation which would be with a radioactive seed guided lumpectomy. I discussed the surgical procedure with her in detail. We discussed the risks which includes but is not limited to bleeding, infection, the need for further surgery if margins are positive, etc. We discussed postoperative recovery as well. She will be seeing both the medical and radiation oncologist as well today. As she is interested in breast conservation and reduction, we will refer her to plastic surgery for further evaluation as well. Again, she will get an MRI preoperatively to evaluate the extent of her disease especially given her family history and size. She will also be receiving genetic counseling. Once the MRI is complete we will  discuss further surgical plans

## 2020-09-02 ENCOUNTER — Other Ambulatory Visit: Payer: Self-pay

## 2020-09-02 ENCOUNTER — Ambulatory Visit (HOSPITAL_COMMUNITY): Payer: Medicare PPO | Admitting: Certified Registered Nurse Anesthetist

## 2020-09-02 ENCOUNTER — Encounter (HOSPITAL_COMMUNITY)
Admission: RE | Disposition: A | Discharge status: Home / Self Care | Payer: Self-pay | Source: Ambulatory Visit | Attending: Surgery

## 2020-09-02 ENCOUNTER — Ambulatory Visit (HOSPITAL_COMMUNITY)
Admission: RE | Admit: 2020-09-02 | Discharge: 2020-09-02 | Disposition: A | Discharge status: Home / Self Care | Payer: Medicare PPO | Source: Ambulatory Visit | Attending: Surgery | Admitting: Surgery

## 2020-09-02 ENCOUNTER — Ambulatory Visit (HOSPITAL_COMMUNITY): Payer: Medicare PPO | Admitting: Vascular Surgery

## 2020-09-02 ENCOUNTER — Encounter (HOSPITAL_COMMUNITY): Payer: Self-pay | Admitting: Surgery

## 2020-09-02 DIAGNOSIS — Z79899 Other long term (current) drug therapy: Secondary | ICD-10-CM | POA: Insufficient documentation

## 2020-09-02 DIAGNOSIS — N62 Hypertrophy of breast: Secondary | ICD-10-CM | POA: Insufficient documentation

## 2020-09-02 DIAGNOSIS — M542 Cervicalgia: Secondary | ICD-10-CM | POA: Diagnosis not present

## 2020-09-02 DIAGNOSIS — E785 Hyperlipidemia, unspecified: Secondary | ICD-10-CM | POA: Diagnosis not present

## 2020-09-02 DIAGNOSIS — I480 Paroxysmal atrial fibrillation: Secondary | ICD-10-CM | POA: Insufficient documentation

## 2020-09-02 DIAGNOSIS — Z170 Estrogen receptor positive status [ER+]: Secondary | ICD-10-CM | POA: Diagnosis not present

## 2020-09-02 DIAGNOSIS — N6489 Other specified disorders of breast: Secondary | ICD-10-CM | POA: Diagnosis not present

## 2020-09-02 DIAGNOSIS — M954 Acquired deformity of chest and rib: Secondary | ICD-10-CM | POA: Diagnosis not present

## 2020-09-02 DIAGNOSIS — N6012 Diffuse cystic mastopathy of left breast: Secondary | ICD-10-CM | POA: Diagnosis not present

## 2020-09-02 DIAGNOSIS — N6011 Diffuse cystic mastopathy of right breast: Secondary | ICD-10-CM | POA: Diagnosis not present

## 2020-09-02 DIAGNOSIS — C50411 Malignant neoplasm of upper-outer quadrant of right female breast: Secondary | ICD-10-CM | POA: Insufficient documentation

## 2020-09-02 DIAGNOSIS — M546 Pain in thoracic spine: Secondary | ICD-10-CM | POA: Diagnosis not present

## 2020-09-02 DIAGNOSIS — Z803 Family history of malignant neoplasm of breast: Secondary | ICD-10-CM | POA: Insufficient documentation

## 2020-09-02 DIAGNOSIS — M5489 Other dorsalgia: Secondary | ICD-10-CM | POA: Diagnosis not present

## 2020-09-02 DIAGNOSIS — Z7901 Long term (current) use of anticoagulants: Secondary | ICD-10-CM | POA: Diagnosis not present

## 2020-09-02 DIAGNOSIS — C50911 Malignant neoplasm of unspecified site of right female breast: Secondary | ICD-10-CM | POA: Diagnosis not present

## 2020-09-02 DIAGNOSIS — N6091 Unspecified benign mammary dysplasia of right breast: Secondary | ICD-10-CM | POA: Diagnosis not present

## 2020-09-02 HISTORY — PX: BREAST LUMPECTOMY WITH RADIOACTIVE SEED LOCALIZATION: SHX6424

## 2020-09-02 HISTORY — PX: BREAST REDUCTION SURGERY: SHX8

## 2020-09-02 SURGERY — BREAST LUMPECTOMY WITH RADIOACTIVE SEED LOCALIZATION
Anesthesia: General | Site: Breast | Laterality: Right

## 2020-09-02 MED ORDER — BUPIVACAINE HCL 0.25 % IJ SOLN
INTRAMUSCULAR | Status: DC | PRN
  Administered 2020-09-02: 20 mL

## 2020-09-02 MED ORDER — ENSURE PRE-SURGERY PO LIQD: 296.0000 mL | Freq: Once | ORAL | Status: DC

## 2020-09-02 MED ORDER — CHLORHEXIDINE GLUCONATE CLOTH 2 % EX PADS: 6.0000 | MEDICATED_PAD | Freq: Once | CUTANEOUS | Status: DC

## 2020-09-02 MED ORDER — MIDAZOLAM HCL 2 MG/2ML IJ SOLN: 0.5000 mg | Freq: Once | INTRAMUSCULAR | Status: DC | PRN

## 2020-09-02 MED ORDER — SUGAMMADEX SODIUM 200 MG/2ML IV SOLN
INTRAVENOUS | Status: DC | PRN
  Administered 2020-09-02: 200 mg via INTRAVENOUS

## 2020-09-02 MED ORDER — PHENYLEPHRINE HCL-NACL 10-0.9 MG/250ML-% IV SOLN
INTRAVENOUS | Status: DC | PRN
  Administered 2020-09-02: 20 ug/min via INTRAVENOUS

## 2020-09-02 MED ORDER — BUPIVACAINE HCL (PF) 0.25 % IJ SOLN
INTRAMUSCULAR | Status: AC
  Filled 2020-09-02: qty 60

## 2020-09-02 MED ORDER — ALBUMIN HUMAN 5 % IV SOLN: INTRAVENOUS | Status: DC | PRN

## 2020-09-02 MED ORDER — 0.9 % SODIUM CHLORIDE (POUR BTL) OPTIME
TOPICAL | Status: DC | PRN
  Administered 2020-09-02: 1000 mL

## 2020-09-02 MED ORDER — HYDROMORPHONE HCL 1 MG/ML IJ SOLN
INTRAMUSCULAR | Status: AC
  Filled 2020-09-02: qty 1

## 2020-09-02 MED ORDER — ORAL CARE MOUTH RINSE: 15.0000 mL | Freq: Once | OROMUCOSAL | Status: AC

## 2020-09-02 MED ORDER — MIDAZOLAM HCL 5 MG/5ML IJ SOLN
INTRAMUSCULAR | Status: DC | PRN
  Administered 2020-09-02 (×2): 1 mg via INTRAVENOUS

## 2020-09-02 MED ORDER — FENTANYL CITRATE (PF) 250 MCG/5ML IJ SOLN
INTRAMUSCULAR | Status: AC
  Filled 2020-09-02: qty 5

## 2020-09-02 MED ORDER — CHLORHEXIDINE GLUCONATE 0.12 % MT SOLN
OROMUCOSAL | Status: AC
  Administered 2020-09-02: 15 mL via OROMUCOSAL
  Filled 2020-09-02: qty 15

## 2020-09-02 MED ORDER — ONDANSETRON HCL 4 MG/2ML IJ SOLN
INTRAMUSCULAR | Status: AC
  Filled 2020-09-02: qty 2

## 2020-09-02 MED ORDER — CHLORHEXIDINE GLUCONATE 0.12 % MT SOLN: 15.0000 mL | Freq: Once | OROMUCOSAL | Status: AC

## 2020-09-02 MED ORDER — ONDANSETRON HCL 4 MG/2ML IJ SOLN
INTRAMUSCULAR | Status: DC | PRN
  Administered 2020-09-02: 4 mg via INTRAVENOUS

## 2020-09-02 MED ORDER — PROPOFOL 500 MG/50ML IV EMUL
INTRAVENOUS | Status: DC | PRN
  Administered 2020-09-02: 25 ug/kg/min via INTRAVENOUS

## 2020-09-02 MED ORDER — LIDOCAINE 2% (20 MG/ML) 5 ML SYRINGE
INTRAMUSCULAR | Status: DC | PRN
  Administered 2020-09-02: 40 mg via INTRAVENOUS

## 2020-09-02 MED ORDER — MIDAZOLAM HCL 2 MG/2ML IJ SOLN
INTRAMUSCULAR | Status: AC
  Filled 2020-09-02: qty 2

## 2020-09-02 MED ORDER — FENTANYL CITRATE (PF) 250 MCG/5ML IJ SOLN
INTRAMUSCULAR | Status: DC | PRN
  Administered 2020-09-02: 150 ug via INTRAVENOUS
  Administered 2020-09-02 (×2): 50 ug via INTRAVENOUS

## 2020-09-02 MED ORDER — HYDROMORPHONE HCL 1 MG/ML IJ SOLN
0.2500 mg | INTRAMUSCULAR | Status: DC | PRN
  Administered 2020-09-02 (×2): 0.5 mg via INTRAVENOUS

## 2020-09-02 MED ORDER — PROMETHAZINE HCL 25 MG/ML IJ SOLN
6.2500 mg | INTRAMUSCULAR | Status: DC | PRN
Start: 2020-09-02 — End: 2020-09-03

## 2020-09-02 MED ORDER — KETAMINE HCL 50 MG/5ML IJ SOSY
PREFILLED_SYRINGE | INTRAMUSCULAR | Status: AC
  Filled 2020-09-02: qty 5

## 2020-09-02 MED ORDER — PHENYLEPHRINE 40 MCG/ML (10ML) SYRINGE FOR IV PUSH (FOR BLOOD PRESSURE SUPPORT)
PREFILLED_SYRINGE | INTRAVENOUS | Status: AC
  Filled 2020-09-02: qty 10

## 2020-09-02 MED ORDER — OXYCODONE HCL 5 MG PO TABS: 5.0000 mg | ORAL_TABLET | Freq: Once | ORAL | Status: DC | PRN

## 2020-09-02 MED ORDER — LIDOCAINE-EPINEPHRINE 1 %-1:100000 IJ SOLN
INTRAMUSCULAR | Status: DC | PRN
Start: 2020-09-02 — End: 2020-09-02
  Administered 2020-09-02: 20 mL

## 2020-09-02 MED ORDER — ACETAMINOPHEN 500 MG PO TABS
ORAL_TABLET | ORAL | Status: AC
  Administered 2020-09-02: 1000 mg via ORAL
  Filled 2020-09-02: qty 2

## 2020-09-02 MED ORDER — DEXAMETHASONE SODIUM PHOSPHATE 10 MG/ML IJ SOLN
INTRAMUSCULAR | Status: DC | PRN
  Administered 2020-09-02: 5 mg via INTRAVENOUS

## 2020-09-02 MED ORDER — LIDOCAINE-EPINEPHRINE 1 %-1:100000 IJ SOLN
INTRAMUSCULAR | Status: AC
  Filled 2020-09-02: qty 1

## 2020-09-02 MED ORDER — SODIUM CHLORIDE 0.9 % IV SOLN
Freq: Once | INTRAVENOUS | Status: AC
  Administered 2020-09-02: 500 mL
  Filled 2020-09-02: qty 10

## 2020-09-02 MED ORDER — ROCURONIUM BROMIDE 100 MG/10ML IV SOLN
INTRAVENOUS | Status: DC | PRN
  Administered 2020-09-02: 60 mg via INTRAVENOUS
  Administered 2020-09-02: 20 mg via INTRAVENOUS

## 2020-09-02 MED ORDER — EPHEDRINE 5 MG/ML INJ
INTRAVENOUS | Status: AC
  Filled 2020-09-02: qty 10

## 2020-09-02 MED ORDER — OXYCODONE HCL 5 MG/5ML PO SOLN: 5.0000 mg | Freq: Once | ORAL | Status: DC | PRN

## 2020-09-02 MED ORDER — CEFAZOLIN SODIUM-DEXTROSE 2-4 GM/100ML-% IV SOLN: 2.0000 g | INTRAVENOUS | Status: DC

## 2020-09-02 MED ORDER — EPHEDRINE SULFATE-NACL 50-0.9 MG/10ML-% IV SOSY
PREFILLED_SYRINGE | INTRAVENOUS | Status: DC | PRN
  Administered 2020-09-02 (×3): 10 mg via INTRAVENOUS

## 2020-09-02 MED ORDER — PROPOFOL 10 MG/ML IV BOLUS
INTRAVENOUS | Status: DC | PRN
  Administered 2020-09-02: 80 mg via INTRAVENOUS

## 2020-09-02 MED ORDER — BUPIVACAINE HCL (PF) 0.25 % IJ SOLN
INTRAMUSCULAR | Status: AC
  Filled 2020-09-02: qty 30

## 2020-09-02 MED ORDER — ACETAMINOPHEN 500 MG PO TABS: 1000.0000 mg | ORAL_TABLET | ORAL | Status: AC

## 2020-09-02 MED ORDER — CEFAZOLIN SODIUM-DEXTROSE 2-4 GM/100ML-% IV SOLN
INTRAVENOUS | Status: AC
  Filled 2020-09-02: qty 100

## 2020-09-02 MED ORDER — KETAMINE HCL 10 MG/ML IJ SOLN
INTRAMUSCULAR | Status: DC | PRN
  Administered 2020-09-02: 15 mg via INTRAVENOUS
  Administered 2020-09-02: 10 mg via INTRAVENOUS

## 2020-09-02 MED ORDER — PHENYLEPHRINE 40 MCG/ML (10ML) SYRINGE FOR IV PUSH (FOR BLOOD PRESSURE SUPPORT)
PREFILLED_SYRINGE | INTRAVENOUS | Status: DC | PRN
  Administered 2020-09-02: 160 ug via INTRAVENOUS
  Administered 2020-09-02 (×4): 80 ug via INTRAVENOUS

## 2020-09-02 MED ORDER — MEPERIDINE HCL 25 MG/ML IJ SOLN: 6.2500 mg | INTRAMUSCULAR | Status: DC | PRN

## 2020-09-02 MED ORDER — DEXAMETHASONE SODIUM PHOSPHATE 10 MG/ML IJ SOLN
INTRAMUSCULAR | Status: AC
  Filled 2020-09-02: qty 1

## 2020-09-02 MED ORDER — LIDOCAINE 2% (20 MG/ML) 5 ML SYRINGE
INTRAMUSCULAR | Status: AC
  Filled 2020-09-02: qty 5

## 2020-09-02 MED ORDER — LACTATED RINGERS IV SOLN: INTRAVENOUS | Status: DC

## 2020-09-02 MED ORDER — CEFAZOLIN SODIUM-DEXTROSE 2-4 GM/100ML-% IV SOLN
2.0000 g | INTRAVENOUS | Status: AC
  Administered 2020-09-02: 2 g via INTRAVENOUS

## 2020-09-02 SURGICAL SUPPLY — 53 items
ADH SKN CLS APL DERMABOND .7 (GAUZE/BANDAGES/DRESSINGS) ×4
APL PRP STRL LF DISP 70% ISPRP (MISCELLANEOUS) ×4
APPLIER CLIP 9.375 MED OPEN (MISCELLANEOUS) ×3
APR CLP MED 9.3 20 MLT OPN (MISCELLANEOUS) ×2
BAG DECANTER FOR FLEXI CONT (MISCELLANEOUS) ×3 IMPLANT
BALL CTTN LRG ABS STRL LF (GAUZE/BANDAGES/DRESSINGS) ×2
BINDER BREAST XLRG (GAUZE/BANDAGES/DRESSINGS) ×1 IMPLANT
BIOPATCH RED 1 DISK 7.0 (GAUZE/BANDAGES/DRESSINGS) ×2 IMPLANT
CHLORAPREP W/TINT 26 (MISCELLANEOUS) ×6 IMPLANT
CLIP APPLIE 9.375 MED OPEN (MISCELLANEOUS) IMPLANT
COTTONBALL LRG STERILE PKG (GAUZE/BANDAGES/DRESSINGS) ×1 IMPLANT
COVER PROBE W GEL 5X96 (DRAPES) ×3 IMPLANT
COVER SURGICAL LIGHT HANDLE (MISCELLANEOUS) ×3 IMPLANT
DECANTER SPIKE VIAL GLASS SM (MISCELLANEOUS) ×2 IMPLANT
DERMABOND ADVANCED (GAUZE/BANDAGES/DRESSINGS) ×2
DERMABOND ADVANCED .7 DNX12 (GAUZE/BANDAGES/DRESSINGS) IMPLANT
DEVICE DUBIN SPECIMEN MAMMOGRA (MISCELLANEOUS) ×3 IMPLANT
DRAIN CHANNEL 19F RND (DRAIN) ×2 IMPLANT
DRAPE LAPAROSCOPIC ABDOMINAL (DRAPES) ×3 IMPLANT
DRSG OPSITE POSTOP 4X10 (GAUZE/BANDAGES/DRESSINGS) ×1 IMPLANT
DRSG OPSITE POSTOP 4X12 (GAUZE/BANDAGES/DRESSINGS) ×1 IMPLANT
DRSG TEGADERM 4X4.75 (GAUZE/BANDAGES/DRESSINGS) ×1 IMPLANT
ELECT CAUTERY BLADE 6.4 (BLADE) ×3 IMPLANT
ELECT REM PT RETURN 9FT ADLT (ELECTROSURGICAL) ×6
ELECTRODE REM PT RTRN 9FT ADLT (ELECTROSURGICAL) ×4 IMPLANT
EVACUATOR SILICONE 100CC (DRAIN) ×2 IMPLANT
GAUZE XEROFORM 5X9 LF (GAUZE/BANDAGES/DRESSINGS) ×1 IMPLANT
GLOVE BIO SURGEON STRL SZ 6.5 (GLOVE) ×6 IMPLANT
GLOVE SURG SIGNA 7.5 PF LTX (GLOVE) ×3 IMPLANT
GOWN STRL REUS W/ TWL LRG LVL3 (GOWN DISPOSABLE) ×6 IMPLANT
GOWN STRL REUS W/ TWL XL LVL3 (GOWN DISPOSABLE) ×2 IMPLANT
GOWN STRL REUS W/TWL LRG LVL3 (GOWN DISPOSABLE) ×9
GOWN STRL REUS W/TWL XL LVL3 (GOWN DISPOSABLE) ×3
KIT BASIN OR (CUSTOM PROCEDURE TRAY) ×3 IMPLANT
KIT MARKER MARGIN INK (KITS) ×3 IMPLANT
NDL HYPO 25GX1X1/2 BEV (NEEDLE) ×2 IMPLANT
NEEDLE HYPO 25GX1X1/2 BEV (NEEDLE) ×3 IMPLANT
NS IRRIG 1000ML POUR BTL (IV SOLUTION) ×3 IMPLANT
PACK GENERAL/GYN (CUSTOM PROCEDURE TRAY) ×6 IMPLANT
SPONGE LAP 18X18 RF (DISPOSABLE) ×6 IMPLANT
STRIP CLOSURE SKIN 1/2X4 (GAUZE/BANDAGES/DRESSINGS) ×3 IMPLANT
SUT MNCRL AB 3-0 PS2 18 (SUTURE) ×6 IMPLANT
SUT MNCRL AB 4-0 PS2 18 (SUTURE) ×12 IMPLANT
SUT MON AB 5-0 PS2 18 (SUTURE) ×3 IMPLANT
SUT PDS AB 3-0 SH 27 (SUTURE) ×5 IMPLANT
SUT SILK 4 0 PS 2 (SUTURE) ×2 IMPLANT
SUT VIC AB 4-0 PS2 18 (SUTURE) ×6 IMPLANT
SUT VIC AB 5-0 PS2 18 (SUTURE) ×1 IMPLANT
SYR 50ML LL SCALE MARK (SYRINGE) IMPLANT
SYR CONTROL 10ML LL (SYRINGE) ×3 IMPLANT
TOWEL GREEN STERILE (TOWEL DISPOSABLE) ×3 IMPLANT
TOWEL GREEN STERILE FF (TOWEL DISPOSABLE) ×9 IMPLANT
UNDERPAD 30X36 HEAVY ABSORB (UNDERPADS AND DIAPERS) ×6 IMPLANT

## 2020-09-02 NOTE — Discharge Instructions (Addendum)
INSTRUCTIONS FOR AFTER SURGERY   You will likely have some questions about what to expect following your operation.  The following information will help you and your family understand what to expect when you are discharged from the hospital.  Following these guidelines will help ensure a smooth recovery and reduce risks of complications.  Postoperative instructions include information on: diet, wound care, medications and physical activity.  AFTER SURGERY Expect to go home after the procedure.  In some cases, you may need to spend one night in the hospital for observation.  DIET This surgery does not require a specific diet.  However, I have to mention that the healthier you eat the better your body can start healing. It is important to increasing your protein intake.  This means limiting the foods with added sugar.  Focus on fruits and vegetables and some meat. It is very important to drink water after your surgery.  If your urine is bright yellow, then it is concentrated, and you need to drink more water.  As a general rule after surgery, you should have 8 ounces of water every hour while awake.  If you find you are persistently nauseated or unable to take in liquids let us know.  NO TOBACCO USE or EXPOSURE.  This will slow your healing process and increase the risk of a wound.  WOUND CARE If you have a drain: Clean with baby wipes until you see the surgeon back in the office.   If you have steri-strips / tape directly attached to your skin leave them in place. It is OK to get these wet.  No baths, pools or hot tubs for two weeks. We close your incision to leave the smallest and best-looking scar. No ointment or creams on your incisions until given the go ahead.  Especially not Neosporin (Too many skin reactions with this one).  A few weeks after surgery you can use Mederma and start massaging the scar. We ask you to wear your binder or sports bra for the first 6 weeks around the clock, including  while sleeping. This provides added comfort and helps reduce the fluid accumulation at the surgery site.  ACTIVITY No heavy lifting until cleared by the doctor.  It is OK to walk and climb stairs. In fact, moving your legs is very important to decrease your risk of a blood clot.  It will also help keep you from getting deconditioned.  Every 1 to 2 hours get up and walk for 5 minutes. This will help with a quicker recovery back to normal.  Let pain be your guide so you don't do too much.  NO, you cannot do the spring cleaning and don't plan on taking care of anyone else.  This is your time for TLC.   WORK Everyone returns to work at different times. As a rough guide, most people take at least 1 - 2 weeks off prior to returning to work. If you need documentation for your job, bring the forms to your postoperative follow up visit.  DRIVING Arrange for someone to bring you home from the hospital.  You may be able to drive a few days after surgery but not while taking any narcotics or valium.  BOWEL MOVEMENTS Constipation can occur after anesthesia and while taking pain medication.  It is important to stay ahead for your comfort.  We recommend taking Milk of Magnesia (2 tablespoons; twice a day) while taking the pain pills.  SEROMA This is fluid your body tried  to put in the surgical site.  This is normal but if it creates excessive pain and swelling let us know.  It usually decreases in a few weeks.  MEDICATIONS and PAIN CONTROL At your preoperative visit for you history and physical you were given the following medications: 1. An antibiotic: Start this medication when you get home and take according to the instructions on the bottle. 2. Zofran 4 mg:  This is to treat nausea and vomiting.  You can take this every 6 hours as needed and only if needed. 3. Norco (hydrocodone/acetaminophen) 5/325 mg:  This is only to be used after you have taken the motrin or the tylenol. Every 8 hours as needed. Over  the counter Medication to take: 4. Ibuprofen (Motrin) 600 mg:  Take this every 6 hours.  If you have additional pain then take 500 mg of the tylenol.  Only take the Norco after you have tried these two. 5. Miralax or stool softener of choice: Take this according to the bottle if you take the [place] Call your surgeon's office if any of the following occur: . Fever 101 degrees F or greater . Excessive bleeding or fluid from the incision site. . Pain that increases over time without aid from the medications . Redness, warmth, or pus draining from incision sites . Persistent nausea or inability to take in liquids . Severe misshapen area that underwent the operation.   [facility] [place] Plastic Surgery Specialist  What is the benefit of having a drain?  During surgery your tissue layers are separated.  This raw surface stimulates your body to fill the space with serous fluid.  This is normal but you don't want that fluid to collect and prevent healing.  A fluid collection can also become infected.  The Jackson-Pratt (JP) drain is used to eliminate this collection of fluid and allow the tissue to heal together.    Jackson-Pratt (JP) bulb    How to care for your drainage and suction unit at home Your drainage catheter will be connected to a collection device. The vacuum caused when the device is compressed allows drainage to collect in the device.    Wendee Copp your hands with soap and water before and after touching the system. . Empty the JP drain every 12 hours once you get home from your procedure. . Record the fluid amount on the record sheet included. . Start with stripping the drain tube to push the clots or excess fluid to the bulb.  Do this by pinching the tube with one hand near your skin.  Then with the other hand squeeze the tubing and work it toward the bulb.  This should be done several times a day.  This may collapse the tube which will correct on its own.   . Use a safety pin to  attach your collection device to your clothing so there is no tension on the insertion site.   . If you have drainage at the skin insertion site, you can apply a gauze dressing and secure it with tape. . If the drain falls out, apply a gauze dressing over the drain insertion site and secure with tape.   To empty the collection device:   . Release the stopper on the top of the collection unit (bulb).  Signa Kell contents into a measuring container such as a plastic medicine cup.  . Record the day and amount of drainage on the attached sheet. . This should be done  at least twice a day.    To compress the Jackson-Pratt Bulb:  . Release the stopper at the top of the bulb. Marland Kitchen Squeeze the bulb tightly in your fist, squeezing air out of the bulb.  . Replace the stopper while the bulb is compressed.  . Be careful not to spill the contents when squeezing the bulb. . The drainage will start bright red and turn to pink and then yellow with time. . IMPORTANT: If the bulb is not squeezed before adding the stopper it will not draw out the fluid.  Care for the JP drain site and your skin daily:  . Don't shower until you see the surgeon. . Secure the drain to a ribbon or cloth around your waist while showering so it does not pull out while showering. . Be sure your hands are cleaned with soap and water. . Use a clean wet cotton swab to clean the skin around the drain site.  . Use another cotton swab to place Vaseline or antibiotic ointment on the skin around the drain.     Contact your physician if any of the following occur:  Marland Kitchen The fluid in the bulb becomes cloudy. . Your temperature is greater than 101.4.  Marland Kitchen The incision opens. . If you have drainage at the skin insertion site, you can apply a gauze dressing and secure it with tape. . If the drain falls out, apply a gauze dressing over the drain insertion site and secure with tape.  . You will usually have more drainage when you are active than while you  rest or are asleep. If the drainage increases significantly or is bloody call the physician                             Bring this record with you to each office visit Date  Drainage Volume  Date   Drainage volume

## 2020-09-02 NOTE — Anesthesia Procedure Notes (Signed)
Procedure Name: Intubation Date/Time: 09/02/2020 12:45 PM Performed by: Hendricks Limes, CRNA Pre-anesthesia Checklist: Patient identified, Emergency Drugs available, Suction available and Patient being monitored Patient Re-evaluated:Patient Re-evaluated prior to induction Oxygen Delivery Method: Circle system utilized Preoxygenation: Pre-oxygenation with 100% oxygen Induction Type: IV induction Ventilation: Mask ventilation without difficulty Laryngoscope Size: Mac and 3 Grade View: Grade III Tube type: Oral Number of attempts: 1 Airway Equipment and Method: Stylet and Oral airway Placement Confirmation: ETT inserted through vocal cords under direct vision,  positive ETCO2 and breath sounds checked- equal and bilateral Secured at: 22 cm Tube secured with: Tape Dental Injury: Teeth and Oropharynx as per pre-operative assessment  Difficulty Due To: Difficulty was anticipated, Difficult Airway- due to anterior larynx and Difficult Airway- due to limited oral opening

## 2020-09-02 NOTE — Anesthesia Postprocedure Evaluation (Signed)
Anesthesia Post Note  Patient: Sheri Sims  Procedure(s) Performed: RIGHT BREAST LUMPECTOMY WITH RADIOACTIVE SEED LOCALIZATION (Right Breast) BILATERAL ONCOPLASTIC BREAST REDUCTION (Bilateral Breast)     Patient location during evaluation: PACU Anesthesia Type: General Level of consciousness: awake and alert, patient cooperative and oriented Pain management: pain level controlled Vital Signs Assessment: post-procedure vital signs reviewed and stable Respiratory status: spontaneous breathing, nonlabored ventilation and respiratory function stable Cardiovascular status: blood pressure returned to baseline and stable Postop Assessment: no apparent nausea or vomiting Anesthetic complications: no   No complications documented.  Last Vitals:  Vitals:   09/02/20 1633 09/02/20 1643  BP:  106/63  Pulse: 78 78  Resp: 15 12  Temp:  36.4 C  SpO2: 96% 97%    Last Pain:  Vitals:   09/02/20 1643  TempSrc:   PainSc: 2                  Izek Corvino,E. Maryori Weide

## 2020-09-02 NOTE — Interval H&P Note (Signed)
History and Physical Interval Note: no change in H and P  09/02/2020 11:59 AM  Sheri Sims  has presented today for surgery, with the diagnosis of RIGHT BREAST CANCER.  The various methods of treatment have been discussed with the patient and family. After consideration of risks, benefits and other options for treatment, the patient has consented to  Procedure(s) with comments: RIGHT BREAST LUMPECTOMY WITH RADIOACTIVE SEED LOCALIZATION (Right) - LMA, COORD CASE W/ DILL[place]AM-NEEDS 2 HOURS Madysun Thall NEEDS 1 HOUR BILATERAL ONCOPLASTIC BREAST REDUCTION (Bilateral) as a surgical intervention.  The patient's history has been reviewed, patient examined, no change in status, stable for surgery.  I have reviewed the patient's chart and labs.  Questions were answered to the patient's satisfaction.     Coralie Keens

## 2020-09-02 NOTE — Transfer of Care (Signed)
Immediate Anesthesia Transfer of Care Note  Patient: Sheri Sims  Procedure(s) Performed: RIGHT BREAST LUMPECTOMY WITH RADIOACTIVE SEED LOCALIZATION (Right Breast) BILATERAL ONCOPLASTIC BREAST REDUCTION (Bilateral Breast)  Patient Location: PACU  Anesthesia Type:General  Level of Consciousness: awake, alert  and oriented  Airway & Oxygen Therapy: Patient Spontanous Breathing  Post-op Assessment: Report given to RN, Post -op Vital signs reviewed and stable and Patient moving all extremities  Post vital signs: Reviewed and stable  Last Vitals:  Vitals Value Taken Time  BP 136/78 09/02/20 1558  Temp    Pulse 83 09/02/20 1558  Resp 14 09/02/20 1558  SpO2 100 % 09/02/20 1558  Vitals shown include unvalidated device data.  Last Pain:  Vitals:   09/02/20 1109  TempSrc:   PainSc: 0-No pain         Complications: No complications documented.

## 2020-09-02 NOTE — Op Note (Signed)
   Sheri Sims 09/02/2020   Pre-op Diagnosis: RIGHT BREAST CANCER     Post-op Diagnosis: same  Procedure(s): RIGHT BREAST LUMPECTOMY WITH RADIOACTIVE SEED LOCALIZATION   Surgeon: Coralie Keens, MD Anesthesia: General  Staff:  Circulator: Celene Squibb, RN; Birder Robson, RN Physician Assistant: Aris Everts, Carola Rhine, PA-C Scrub Person: Philomena Doheny, RN  Estimated Blood Loss: Minimal               Specimens: sent to path  Indications: This is a 70 year old female who was found to have 2 separate foci of breast cancer in the right breast as well as a area of atypical ductal hyperplasia.  After long discussion with the patient she is elected to proceed with a radioactive seed guided lumpectomy with 3 separate seeds and then oncoplastic reduction.  The reduction will be performed by plastic surgery.  Procedure: The patient was brought to operating identifies correct patient.  She is placed upon the operating table general anesthesia was induced.  Her breast and chest were prepped and draped in usual sterile fashion.  Identified the 3 radioactive seeds in the upper inner quadrant of the breast.  I made an incision that was previously marked by the plastic surgeons with a scalpel.  I then dissected down to the breast tissue with electrocautery.  With the aid of the neoprobe I then stayed widely around all 3 radioactive seeds which were easily identifiable.  I took the entire dissection down to the chest wall and then completed the lumpectomy staying widely around all seeds with the neoprobe.  Once the large specimen was removed, I marked the margins with paint.  An x-ray was performed that showed all radioactive seeds and previous biopsy clips were in the specimen.  I again evaluate the lumpectomy cavity with the neoprobe and found no other increased uptake.  The lumpectomy specimen was sent to pathology for evaluation.  At this point hemostasis appeared to be achieved.  I placed  surgical clips around the cavity to monitor for radiation purposes.  At this point Dr. [name] continued her portion of the bilateral reduction.  All counts for my procedure were correct at the end of the procedure.          Coralie Keens   Date: 09/02/2020  Time: 1:38 PM

## 2020-09-02 NOTE — Op Note (Signed)
Op note:    DATE OF PROCEDURE: 09/02/2020  LOCATION: [facility]  SURGEON: Lyndee Leo Sanger Loren Vicens, DO  ASSISTANT: Roetta Sessions, PA  PREOPERATIVE DIAGNOSIS 1. Right breast cancer 2. Breast asymmetry 3. Macromastia / Neck Pain / Back Pain  POSTOPERATIVE DIAGNOSIS: Same as preoperative diagnosis  PROCEDURES 1. Bilateral breast reduction.  Right reduction 775 g, Left reduction 735 g  COMPLICATIONS: None.  DRAINS: bilateral #19 black  INDICATIONS FOR PROCEDURE Sheri Sims is a 70 y.o. year-old female born on 1951/06/09,with right breast cancer.  She has breast asymmetry. She also has a history of symptomatic macromastia with concominant back pain, neck pain, shoulder grooving from her bra.   MRN: [id number]  CONSENT Informed consent was obtained directly from the patient. The risks, benefits and alternatives were fully discussed. Specific risks including but not limited to bleeding, infection, hematoma, seroma, scarring, pain, nipple necrosis, asymmetry, poor cosmetic results, and need for further surgery were discussed. The patient had ample opportunity to have her questions answered to her satisfaction.  DESCRIPTION OF PROCEDURE  Patient was brought into the operating room and placed in a supine position.  SCDs were placed and appropriate padding was performed.  Antibiotics were given. The patient underwent general anesthesia and the chest was prepped and draped in a sterile fashion.  A timeout was performed and all information was confirmed to be correct.  Left side: Preoperative markings were confirmed.  Incision lines were injected with 1% Xylocaine with epinephrine.  After waiting for vasoconstriction, the marked lines were incised.  A Wise-pattern superomedial breast reduction was performed by de-epithelializing the pedicle, using bovie to create the superomedial pedicle, and removing breast tissue from the lateral and inferior portions of the  breast.  Care was taken to not undermine the breast pedicle. Hemostasis was achieved.  The nipple was gently rotated into position.  There was severe pulling and tension.  The decision was made to transition to the amputation technique.  The NAC was removed as a full split thickness graft.  The NAC was placed on the breast mound and secured with the 4-0 Vicryl for the bolster.  The 5-0 Monocryl was used to close the skin.  The xeroform was used as the bolster.  The patient was sat upright and size and shape symmetry was confirmed.  The pocket was irrigated and hemostasis confirmed.  The deep tissues were approximated with 3-0 PDS and Monocryl sutures and the skin was closed with deep dermal and subcuticular 4-0 Monocryl sutures.  A drain was placed and secured with the silk. Dermabond was applied.    Right: Preoperative markings were confirmed.  Incision lines were injected with 1% Xylocaine with epinephrine.  After waiting for vasoconstriction, the marked lines were incised.  An inferior pedical breast reduction was performed by de-epithelializing the pedicle, using bovie to create the lateral and medial pedicles, and removing breast tissue from the superior, lateral, and medial portions of the breast.  Care was taken to not undermine the breast pedicle. Hemostasis was achieved.  The nipple was gently rotated into position and the skin was temporarily closed with staples.  The patient was sat upright and size and shape symmetry was confirmed.  The pocket was irrigated, a drain was placed, and hemostasis confirmed.  The deep tissues were approximated with 3-0 PDS and Monocryl sutures and the skin was closed with deep dermal and subcuticular 4-0 Monocryl sutures followed by 5-0 Monocryl.  The nipple areola complex was brought out with the  skin de-epithelialized at the location to make place for the complex.  The area was secured with 4-0 Monocryl at the deep layers followed by 5-0 Monocryl.  The nipple and skin  flaps had good capillary refill at the end of the procedure. The patient tolerated the procedure well. The patient was allowed to wake from anesthesia and taken to the recovery room in satisfactory condition.  The advanced practice practitioner (APP) assisted throughout the case.  The APP was essential in retraction and counter traction when needed to make the case progress smoothly.  This retraction and assistance made it possible to see the tissue plans for the procedure.  The assistance was needed for blood control, tissue re-approximation and assisted with closure of the incision site.

## 2020-09-02 NOTE — Interval H&P Note (Signed)
History and Physical Interval Note:  09/02/2020 12:20 PM  Sheri Sims  has presented today for surgery, with the diagnosis of RIGHT BREAST CANCER.  The various methods of treatment have been discussed with the patient and family. After consideration of risks, benefits and other options for treatment, the patient has consented to  Procedure(s) with comments: RIGHT BREAST LUMPECTOMY WITH RADIOACTIVE SEED LOCALIZATION (Right) - LMA, COORD CASE W/ [place]-NEEDS 2 HOURS BLACKMAN NEEDS 1 HOUR BILATERAL ONCOPLASTIC BREAST REDUCTION (Bilateral) as a surgical intervention.  The patient's history has been reviewed, patient examined, no change in status, stable for surgery.  I have reviewed the patient's chart and labs.  Questions were answered to the patient's satisfaction.     Loel Lofty Dillingham

## 2020-09-02 NOTE — Progress Notes (Signed)
Patient is a 70 year old female status post oncoplastic Breast reduction today with Dr. [name] after right breast lumpectomy by dr Ninfa Linden. She called the on-call provider service this evening to report that she noticed a lot of drainage around the Drains within her bilateral breast. She reports that she is not having any increased pain, she reports that her breasts feel soft to touch. She reports that she has not had to take any p ain medication other than Tylenol which was provided to her prior to discharge around 5 PM. She reports that she is worried about all the drainage. I discussed with her that it is reassuring that she is not an increased pain, she has not noticed any increased drainage within the bulbs, she's not noticing any active drainage from the bulbs or around the bulbs at this time. It is also reassuring that her bilateral breast are soft to palpa tion. I discussed with her that I would be happy to see her in the morning for evaluation, I discussed with her that if it any point she feels as if her symptoms are worsening or do not improve that she should be evaluated by a healthcare provider in an emergency room setting . Patient is understanding of this. I also recommend she called the on-call provider service this evening if she has any questions or concerns. I discussed with th e patient that I would call her tomorrow to check in.

## 2020-09-03 ENCOUNTER — Encounter: Payer: Self-pay | Admitting: Surgical

## 2020-09-03 ENCOUNTER — Other Ambulatory Visit: Payer: Self-pay

## 2020-09-03 ENCOUNTER — Ambulatory Visit (INDEPENDENT_AMBULATORY_CARE_PROVIDER_SITE_OTHER): Payer: Medicare PPO | Admitting: Surgical

## 2020-09-03 ENCOUNTER — Emergency Department (HOSPITAL_BASED_OUTPATIENT_CLINIC_OR_DEPARTMENT_OTHER)
Admission: EM | Admit: 2020-09-03 | Discharge: 2020-09-03 | Disposition: A | Discharge status: Home / Self Care | Payer: Medicare PPO | Attending: Emergency Medicine | Admitting: Emergency Medicine

## 2020-09-03 ENCOUNTER — Encounter (HOSPITAL_BASED_OUTPATIENT_CLINIC_OR_DEPARTMENT_OTHER): Payer: Self-pay | Admitting: Emergency Medicine

## 2020-09-03 VITALS — BP 128/86 | HR 86 | Temp 97.1°F | Ht 65.0 in | Wt 185.0 lb

## 2020-09-03 DIAGNOSIS — Z853 Personal history of malignant neoplasm of breast: Secondary | ICD-10-CM | POA: Diagnosis not present

## 2020-09-03 DIAGNOSIS — M546 Pain in thoracic spine: Secondary | ICD-10-CM

## 2020-09-03 DIAGNOSIS — X58XXXD Exposure to other specified factors, subsequent encounter: Secondary | ICD-10-CM | POA: Insufficient documentation

## 2020-09-03 DIAGNOSIS — Z5189 Encounter for other specified aftercare: Secondary | ICD-10-CM

## 2020-09-03 DIAGNOSIS — S21001D Unspecified open wound of right breast, subsequent encounter: Secondary | ICD-10-CM | POA: Diagnosis not present

## 2020-09-03 DIAGNOSIS — C50411 Malignant neoplasm of upper-outer quadrant of right female breast: Secondary | ICD-10-CM

## 2020-09-03 DIAGNOSIS — Z9012 Acquired absence of left breast and nipple: Secondary | ICD-10-CM | POA: Diagnosis not present

## 2020-09-03 DIAGNOSIS — Z170 Estrogen receptor positive status [ER+]: Secondary | ICD-10-CM

## 2020-09-03 DIAGNOSIS — G8929 Other chronic pain: Secondary | ICD-10-CM

## 2020-09-03 DIAGNOSIS — Z4800 Encounter for change or removal of nonsurgical wound dressing: Secondary | ICD-10-CM | POA: Diagnosis not present

## 2020-09-03 DIAGNOSIS — I480 Paroxysmal atrial fibrillation: Secondary | ICD-10-CM

## 2020-09-03 NOTE — ED Triage Notes (Signed)
Patient states stopped taking xarelto approx 2 weeks ago.

## 2020-09-03 NOTE — Progress Notes (Signed)
Patient is a 70 year old female here for follow-up after bilateral oncoplastic reduction yesterday with Dr. [name].  She also had right lumpectomy by Dr. Ninfa Linden at that time.  The right breast was an inferior pedicle technique, the left breast was amputation technique due to poor vascularity noted intraoperatively. Patient called the on-call provider service last night over concern for bleeding/significant drainage.  She had reported at the time that she noticed her breast binder was soaked with blood and she was concerned.  She subsequently was evaluated in the emergency room and per EMR review everything seem to be stable and she was discharged home after dressing change.  Patient reports she is overall feeling well this morning, she does report some burning pain of her left breast along the incision.  She reports otherwise her pain is been very minimal.  She reports she has not had to take much of the hydrocodone.  She is here with her sister today.  Chaperone present on exam On exam left breast incisions are intact, covered with Steri-Strips and honeycomb dressing.  Left NAC is bolstered on.  Left JP drain in place with serosanguineous drainage in the bulb.  Right breast incisions intact, right NAC is viable with good color.  Right dressings are intact.  Right JP drain in place with serosanguineous drainage/some dark red blood.  No erythema of either breast noted.  Bilateral breasts are soft to palpation, no firmness noted.  Bilateral breasts without fluid wave noted.  Bilateral breast without significant tenderness.  I discussed with the patient that based on her exam, everything appears to be doing well.  I do not feel any signs of hematoma or seroma.  There is no sign of infection.  I discussed with the patient that she should continue to wear compressive garment 24/7 for the next few weeks.  I recommend she continue to avoid strenuous activity.  I discussed with her to use the narcotics as  needed for pain control.  I discussed with her that if she has any other questions or concerns please call our office and we would be happy to see her sooner than her next appointment.  She will follow up in 1 week for reevaluation, possible removal of bilateral JP drains.  I discussed with her to avoid showering to prevent getting the bilateral breast/left bolster wet.

## 2020-09-03 NOTE — ED Notes (Signed)
Dressings changed, patient tolerated well.

## 2020-09-03 NOTE — ED Provider Notes (Signed)
[facility] EMERGENCY DEPARTMENT Provider Note   CSN: [id number] Arrival date & time: 09/03/20  0334     History Chief Complaint  Patient presents with  . Wound Check    Sheri Sims is a 70 y.o. female.  Patient presents to the emergency department for evaluation of drainage from surgical site.  Patient had right breast lumpectomy followed by breast reduction surgery earlier today.  Patient reports that she has drains in bilaterally.  She has noticed some bloody drainage into the drains and has had increasing firmness of both of her breasts.  She is concerned that the drains are not functioning.        Past Medical History:  Diagnosis Date  . Arthritis   . Atrial flutter ([place])   . Cancer [facility])    breast cancer  . Costochondritis   . Enthesopathy of hip region   . Family history of breast cancer 07/21/2020  . Fatigue   . Fibromyalgia   . Helicobacter pylori infection   . Insomnia   . MVP (mitral valve prolapse)    OF ANTERIOR MITRAL VALVE LEAFLET  . Obesity   . Paroxysmal atrial fibrillation ([facility])   . Pharyngitis   . Pneumonia   . Sinusitis, chronic     Patient Active Problem List   Diagnosis Date Noted  . Genetic testing 07/30/2020  . Family history of breast cancer 07/21/2020  . Malignant neoplasm of upper-outer quadrant of right breast in female, estrogen receptor positive ([place]) 07/15/2020  . Paroxysmal atrial fibrillation ([place]) 09/22/2019  . URI (upper respiratory infection) 08/20/2018  . Pain in right knee 01/09/2018  . Wheezing 11/05/2017  . Mitral valve prolapse 08/02/2015  . Arm pain 07/21/2015  . Foot pain 07/21/2015  . Awareness of heartbeats 07/21/2015  . Osteopenia 07/21/2015  . Symptoms involving urinary system 07/21/2015  . Insomnia, persistent 07/21/2015  . Abnormal chest x-ray 02/02/2015  . Arthritis 02/02/2015  . Calcaneal spur 02/02/2015  . Cardiac conduction disorder 02/02/2015  . Acid reflux 02/02/2015  . Fatigue 02/02/2015   . Heel pain 02/02/2015  . HLD (hyperlipidemia) 02/02/2015  . Idiopathic angio-edema-urticaria 02/02/2015  . Elevated fasting blood sugar 02/02/2015  . Encounter for general adult medical examination without abnormal findings 02/02/2015  . Disorder of hematopoietic structure 02/02/2015  . Back ache 02/02/2015  . Pleurisy 02/02/2015  . Posterior vitreous detachment 02/02/2015  . Temporomandibular joint disorder 02/02/2015  . Bilateral arm pain 02/02/2015  . Pulmonary infiltrates 02/13/2014  . Fibromyalgia 11/03/2013  . Heart palpitations 11/03/2013  . Acute respiratory failure with hypoxia ([place]) 11/03/2013  . Chest pain, pleuritic 11/03/2013  . Pneumonia 11/03/2013  . Myalgia and myositis 07/11/2013  . Osteoarthritis, hand 07/11/2013  . Anxiety 03/28/2013  . Positive antinuclear antibody 03/28/2013    Past Surgical History:  Procedure Laterality Date  . ABDOMINAL HYSTERECTOMY    . APPENDECTOMY    . ATRIAL FIBRILLATION ABLATION N/A 11/06/2019   Procedure: ATRIAL FIBRILLATION ABLATION;  Surgeon: Thompson Grayer, MD;  Location: [facility];  Service: Cardiovascular;  Laterality: N/A;  . BREAST LUMPECTOMY    . CARDIAC CATHETERIZATION  2010   Normal  . CHOLECYSTECTOMY    . STRESS ECHO TEST  05/31/2010   NO ISCHEMIA  . TONSILLECTOMY    . TRANSTHORACIC ECHOCARDIOGRAM  05/16/2010   EF 55-60%     OB History   No obstetric history on file.     Family History  Problem Relation Age of Onset  . Breast  cancer Mother 110  . Breast cancer Sister 9  . Cancer Maternal Grandmother        metastatic, unknown primary; dx 55  . Breast cancer Cousin        d. 110; maternal cousin    Social History   Tobacco Use  . Smoking status: Never Smoker  . Smokeless tobacco: Never Used  Vaping Use  . Vaping Use: Never used  Substance Use Topics  . Alcohol use: No  . Drug use: No    Home Medications Prior to Admission medications   Medication Sig Start Date End Date Taking?  Authorizing Provider  acetaminophen (TYLENOL) 650 MG CR tablet Take 650 mg by mouth at bedtime. 05/04/15   [provider]  Calcium Carbonate (CALCIUM 600 PO) Take 1 tablet by mouth 3 (three) times a week.  Patient not taking: Reported on 08/25/2020 06/03/19   [provider]  calcium carbonate (TUMS - DOSED IN MG ELEMENTAL CALCIUM) 500 MG chewable tablet Chew 1 tablet by mouth as needed for indigestion or heartburn.     [provider]  cephALEXin (KEFLEX) 500 MG capsule Take 500 mg by mouth 4 (four) times daily.    [provider]  cetirizine (ZYRTEC) 10 MG tablet Take 10 mg by mouth at bedtime as needed (allergies). 05/04/15   [provider]  cholecalciferol (VITAMIN D) 1000 UNITS tablet Take 1,000 Units by mouth daily.    [provider]  HYDROcodone-homatropine (HYCODAN) 5-1.5 MG/5ML syrup Take 5 mLs by mouth every 6 (six) hours as needed for cough. 07/12/20   Tanda Rockers, MD  loratadine (CLARITIN) 10 MG tablet Take 10 mg by mouth daily as needed for allergies.    [provider]  metoprolol succinate (TOPROL-XL) 25 MG 24 hr tablet Take 1.5 tablets (37.5 mg total) by mouth daily. 03/10/20   Deboraha Sprang, MD  multivitamin [facility]) per tablet Take 1 tablet by mouth every other day.    [provider]  ondansetron (ZOFRAN) 4 MG tablet Take 1 tablet (4 mg total) by mouth every 8 (eight) hours as needed for nausea or vomiting. 08/19/20   Scheeler, Carola Rhine, PA-C  Polyethyl Glycol-Propyl Glycol (SYSTANE ULTRA) 0.4-0.3 % SOLN Place 1 drop into both eyes 3 (three) times daily as needed (dry eyes).    [provider]  rivaroxaban (XARELTO) 20 MG TABS tablet Take 20 mg by mouth daily with supper. 04/16/20   [provider]  sodium chloride (OCEAN) 0.65 % SOLN nasal spray Place 1 spray into both nostrils as needed for congestion.    [provider]  tobramycin (TOBREX) 0.3 % ophthalmic solution Place 1  drop into the right eye See admin instructions. For use on the day before, day of, and day after eye injection 07/14/20   [provider]  vitamin B-12 (CYANOCOBALAMIN) 500 MCG tablet Take 500 mcg by mouth every other day. Patient not taking: Reported on 08/25/2020    [provider]    Allergies    Codeine, Sulfur dioxide, Sulfites, Codeine phosphate, Eliquis [apixaban], Other, and Sulfa antibiotics  Review of Systems   Review of Systems  Constitutional: Negative for fever.  Skin: Positive for wound.  All other systems reviewed and are negative.   Physical Exam Updated Vital Signs BP 132/77 (BP Location: Right Arm)   Pulse 90   Temp 98.1 F (36.7 C) (Oral)   Resp 16   Ht 5' 5.5" (1.664 m)   Wt 81.6 kg  SpO2 97%   BMI 29.48 kg/m   Physical Exam Vitals and nursing note reviewed.  Constitutional:      General: She is not in acute distress.    Appearance: Normal appearance. She is well-developed and well-nourished.  HENT:     Head: Normocephalic and atraumatic.     Right Ear: Hearing normal.     Left Ear: Hearing normal.     Nose: Nose normal.     Mouth/Throat:     Mouth: Oropharynx is clear and moist and mucous membranes are normal.  Eyes:     Extraocular Movements: EOM normal.     Conjunctiva/sclera: Conjunctivae normal.     Pupils: Pupils are equal, round, and reactive to light.  Cardiovascular:     Rate and Rhythm: Regular rhythm.     Heart sounds: S1 normal and S2 normal. No murmur heard. No friction rub. No gallop.   Pulmonary:     Effort: Pulmonary effort is normal. No respiratory distress.     Breath sounds: Normal breath sounds.  Chest:     Chest wall: No tenderness.     Comments: Right breast with surgical incision around areola and vertical incision on underside of breast - intact.  No erythema, warmth or drainage.  Palpating all poles of the breast not reveal any lumps, fluctuance or obvious fluid collections.  Drain in place with  approximately 5 mL of bloody serous fluid, no drainage around tube  Left breast surgical incision around areola and vertical incision on underside of breast -intact.  No erythema warmth or drainage. Palpating all poles of the breast not reveal any lumps, fluctuance or obvious fluid collections.  Drain in place with approximately 10 mL of bloody serous fluid.  Moderate amount of bloody drainage present on compression dressing at drain site.  No palpable fluid collections at either drain insertion site Abdominal:     General: Bowel sounds are normal.     Palpations: Abdomen is soft. There is no hepatosplenomegaly.     Tenderness: There is no abdominal tenderness. There is no guarding or rebound. Negative signs include Murphy's sign and McBurney's sign.     Hernia: No hernia is present.  Musculoskeletal:        General: Normal range of motion.     Cervical back: Normal range of motion and neck supple.  Skin:    General: Skin is warm, dry and intact.     Findings: No rash.     Nails: There is no cyanosis.  Neurological:     Mental Status: She is alert and oriented to person, place, and time.     GCS: GCS eye subscore is 4. GCS verbal subscore is 5. GCS motor subscore is 6.     Cranial Nerves: No cranial nerve deficit.     Sensory: No sensory deficit.     Coordination: Coordination normal.     Deep Tendon Reflexes: Strength normal.  Psychiatric:        Mood and Affect: Mood and affect normal.        Speech: Speech normal.        Behavior: Behavior normal.        Thought Content: Thought content normal.     ED Results / Procedures / Treatments   Labs (all labs ordered are listed, but only abnormal results are displayed) Labs Reviewed - No data to display  EKG None  Radiology No results found.  Procedures Procedures   Medications Ordered in ED Medications - No  data to display  ED Course  I have reviewed the triage vital signs and the nursing notes.  Pertinent labs &  imaging results that were available during my care of the patient were reviewed by me and considered in my medical decision making (see chart for details).    MDM Rules/Calculators/A&P                          Patient with concern over the functionality of her drains.  She does have some bloody serous fluid in both drains.  Drains appear to be functioning, no fluid collections at the drain insertion sites.  No fluid collections at the drain insertion sites.  Surgical incisions are intact without any concerning signs of dehiscence or infection.  I have palpated both breasts and there are no signs of fluid collections.  I believe the firmness that she is feeling is simply postoperative inflammation which is normal.  Patient reassured, wounds redressed.  She will follow up with plastic surgery by phone later today.  Final Clinical Impression(s) / ED Diagnoses Final diagnoses:  Visit for wound check    Rx / DC Orders ED Discharge Orders    None       Canisha Issac, Gwenyth Allegra, MD 09/03/20 [phone]

## 2020-09-03 NOTE — ED Triage Notes (Signed)
Patient presents with complaints of wound bleeding; had lumpectomy and breast reduction on 09/02/20; patient states feels like drains are not draining properly. Surgery per Dr Ninfa Linden and Dr [name].

## 2020-09-09 ENCOUNTER — Ambulatory Visit (INDEPENDENT_AMBULATORY_CARE_PROVIDER_SITE_OTHER): Payer: Medicare PPO | Admitting: Surgical

## 2020-09-09 ENCOUNTER — Telehealth: Payer: Self-pay

## 2020-09-09 ENCOUNTER — Other Ambulatory Visit: Payer: Self-pay

## 2020-09-09 ENCOUNTER — Encounter: Payer: Self-pay | Admitting: Surgical

## 2020-09-09 ENCOUNTER — Encounter: Payer: Self-pay | Admitting: *Deleted

## 2020-09-09 ENCOUNTER — Telehealth: Payer: Self-pay | Admitting: *Deleted

## 2020-09-09 VITALS — BP 127/82 | HR 82

## 2020-09-09 DIAGNOSIS — M546 Pain in thoracic spine: Secondary | ICD-10-CM

## 2020-09-09 DIAGNOSIS — C50411 Malignant neoplasm of upper-outer quadrant of right female breast: Secondary | ICD-10-CM

## 2020-09-09 DIAGNOSIS — G8929 Other chronic pain: Secondary | ICD-10-CM

## 2020-09-09 DIAGNOSIS — I480 Paroxysmal atrial fibrillation: Secondary | ICD-10-CM

## 2020-09-09 DIAGNOSIS — Z170 Estrogen receptor positive status [ER+]: Secondary | ICD-10-CM

## 2020-09-09 LAB — SURGICAL PATHOLOGY

## 2020-09-09 NOTE — Telephone Encounter (Signed)
Received call from pt with complaint of JP drains not adequately draining post breast reconstruction surgery.  MD notified and states pt needing to f/u with surgeon for further evaluation and tx. Pt verbalized understanding and states she will call surgeons office to make an apt.

## 2020-09-09 NOTE — Progress Notes (Signed)
Patient Care Team: Aletha Halim., PA-C as PCP - General (Family Medicine) Deboraha Sprang, MD as PCP - Cardiology (Cardiology) Mauro Kaufmann, RN as Oncology Nurse Navigator Rockwell Germany, RN as Oncology Nurse Navigator Nicholas Lose, MD as Consulting Physician (Hematology and Oncology) Coralie Keens, MD as Consulting Physician (General Surgery) Gery Pray, MD as Consulting Physician (Radiation Oncology)  DIAGNOSIS:    ICD-10-CM   1. Malignant neoplasm of upper-outer quadrant of right breast in female, estrogen receptor positive ([place])  C50.411    Z17.0     SUMMARY OF ONCOLOGIC HISTORY: Oncology History  Malignant neoplasm of upper-outer quadrant of right breast in female, estrogen receptor positive ([place])  07/15/2020 Initial Diagnosis   Screening mammogram showed calcifications in the upper inner right breast and a 1.5cm architectural distortion. Diagnostic mammogram and US showed a 1.0cm upper outer right breast mass and a 0.7cm irregular area central to the nipple.  ER 95%, PR 95%, HER2 negative, Ki-67 10%   07/21/2020 Cancer Staging   Staging form: Breast, AJCC 8th Edition - Clinical stage from 07/21/2020: Stage IA (cT1b, cN0, cM0, G3, ER+, PR+, HER2-) - Signed by Nicholas Lose, MD on 07/21/2020   07/28/2020 Genetic Testing   Negative hereditary cancer genetic testing: no pathogenic variants detected in [facility]CAPl[facility] Panel.  The report date is July 28, 2020.   The BRCAplus panel offered by [employer] and includes sequencing and deletion/duplication analysis for the following 8 genes: ATM, BRCA1, BRCA2, CDH1, CHEK2, PALB2, PTEN, and TP53.  Negative hereditary cancer genetic testing: no pathogenic variants detected in [facility] CustomNext-Cancer +RNAinsight Panel.  The report date is August 01, 2020.   The CustomNext-Cancer+RNAinsight panel offered by [facility] includes sequencing and rearrangement analysis for the following 47 genes:  APC, ATM, AXIN2, BARD1,  BMPR1A, BRCA1, BRCA2, BRIP1, CDH1, CDK4, CDKN2A, CHEK2, DICER1, EPCAM, GREM1, HOXB13, MEN1, MLH1, MSH2, MSH3, MSH6, MUTYH, NBN, NF1, NF2, NTHL1, PALB2, PMS2, POLD1, POLE, PTEN, RAD51C, RAD51D, RECQL, RET, SDHA, SDHAF2, SDHB, SDHC, SDHD, SMAD4, SMARCA4, STK11, TP53, TSC1, TSC2, and VHL.  RNA data is routinely analyzed for use in variant interpretation for all genes.   09/02/2020 Surgery   Bilateral breast reduction and right lumpectomy ([place]): multifocal IDC grade 2, 1.3cm, with DCIS, clear margins.      CHIEF COMPLIANT: Follow-up s/p right lumpectomy   INTERVAL HISTORY: Sheri Sims is a 70 y.o. with above-mentioned history of right breast cancer. She underwent bilateral breast reduction and right lumpectomy on 09/02/20 with Dr. Ninfa Linden and Dr. [name] for which pathology showed multifocal invasive ductal carcinoma, grade 2, 1.3cm, with DCIS, clear margins. She presents to the clinic today to review the pathology report and discuss further treatment. She had bleeding and discharges post surgery and was taken care of by plastic surgery  ALLERGIES:  is allergic to codeine, sulfur dioxide, sulfites, codeine phosphate, eliquis [apixaban], other, and sulfa antibiotics.  MEDICATIONS:  Current Outpatient Medications  Medication Sig Dispense Refill  . acetaminophen (TYLENOL) 650 MG CR tablet Take 650 mg by mouth at bedtime.    . Calcium Carbonate (CALCIUM 600 PO) Take 1 tablet by mouth 3 (three) times a week.    . calcium carbonate (TUMS - DOSED IN MG ELEMENTAL CALCIUM) 500 MG chewable tablet Chew 1 tablet by mouth as needed for indigestion or heartburn.     . cephALEXin (KEFLEX) 500 MG capsule Take 500 mg by mouth 4 (four) times daily.    . cetirizine (ZYRTEC) 10 MG tablet  Take 10 mg by mouth at bedtime as needed (allergies).    . cholecalciferol (VITAMIN D) 1000 UNITS tablet Take 1,000 Units by mouth daily.    Marland Kitchen HYDROcodone-homatropine (HYCODAN) 5-1.5 MG/5ML syrup Take 5 mLs by mouth  every 6 (six) hours as needed for cough. 120 mL 0  . loratadine (CLARITIN) 10 MG tablet Take 10 mg by mouth daily as needed for allergies.    . metoprolol succinate (TOPROL-XL) 25 MG 24 hr tablet Take 1.5 tablets (37.5 mg total) by mouth daily. 135 tablet 3  . multivitamin (THERAGRAN) per tablet Take 1 tablet by mouth every other day.    . ondansetron (ZOFRAN) 4 MG tablet Take 1 tablet (4 mg total) by mouth every 8 (eight) hours as needed for nausea or vomiting. 20 tablet 0  . Polyethyl Glycol-Propyl Glycol (SYSTANE ULTRA) 0.4-0.3 % SOLN Place 1 drop into both eyes 3 (three) times daily as needed (dry eyes).    . rivaroxaban (XARELTO) 20 MG TABS tablet Take 20 mg by mouth daily with supper.    . sodium chloride (OCEAN) 0.65 % SOLN nasal spray Place 1 spray into both nostrils as needed for congestion.    Marland Kitchen tobramycin (TOBREX) 0.3 % ophthalmic solution Place 1 drop into the right eye See admin instructions. For use on the day before, day of, and day after eye injection    . vitamin B-12 (CYANOCOBALAMIN) 500 MCG tablet Take 500 mcg by mouth every other day.     No current facility-administered medications for this visit.    PHYSICAL EXAMINATION: ECOG PERFORMANCE STATUS: 1 - Symptomatic but completely ambulatory  Vitals:   09/10/20 0953  BP: (!) 102/54  Pulse: 69  Resp: 20  Temp: 97.7 F (36.5 C)  SpO2: 98%   Filed Weights   09/10/20 0953  Weight: 179 lb 9.6 oz (81.5 kg)    LABORATORY DATA:  I have reviewed the data as listed CMP Latest Ref Rng & Units 08/30/2020 07/21/2020 11/10/2019  Glucose 70 - 99 mg/dL 71 70 147(H)  BUN 8 - 23 mg/dL _0 Creatinine 0.44 - 1.00 mg/dL 0.63 0.75 0.68  Sodium 135 - 145 mmol/L 140 142 138  Potassium 3.5 - 5.1 mmol/L 4.3 4.1 4.1  Chloride 98 - 111 mmol/L 105 108 103  CO2 22 - 32 mmol/L _1 Calcium 8.9 - 10.3 mg/dL 9.2 9.1 9.1  Total Protein 6.5 - 8.1 g/dL - 6.9 -  Total Bilirubin 0.3 - 1.2 mg/dL - 0.5 -  Alkaline Phos 38 - 126 U/L - 54  -  AST 15 - 41 U/L - 18 -  ALT 0 - 44 U/L - 15 -    Lab Results  Component Value Date   WBC 3.8 (L) 08/30/2020   HGB 14.7 08/30/2020   HCT 47.1 (H) 08/30/2020   MCV 100.6 (H) 08/30/2020   PLT 238 08/30/2020   NEUTROABS 2.7 07/21/2020    ASSESSMENT & PLAN:  Malignant neoplasm of upper-outer quadrant of right breast in female, estrogen receptor positive ([place]) 09/02/20: Bilateral breast reduction and right lumpectomy Ninfa Linden & [place]): multifocal IDC grade 2, 1.3cm, with DCIS, clear margins. ER 95%, PR 95%, HER2 negative, Ki-67 10%  Pathology counseling: I discussed the final pathology report of the patient provided  a copy of this report. I discussed the margins as well as lymph node surgeries. We also discussed the final staging along with previously performed ER/PR and HER-2/neu testing.  Treatment Plan: 1. Oncotype DX  testing to determine if chemotherapy would be of any benefit followed by 2. Adjuvant radiation therapy followed by 3. Adjuvant antiestrogen therapy  Return to clinic based upon Oncotype test results    No orders of the defined types were placed in this encounter.  The patient has a good understanding of the overall plan. she agrees with it. she will call with any problems that may develop before the next visit here.  Total time spent: 30 mins including face to face time and time spent for planning, charting and coordination of care  Rulon Eisenmenger, MD, MPH 09/10/2020  I, Molly Dorshimer, am acting as scribe for Dr. Nicholas Lose.  I have reviewed the above documentation for accuracy and completeness, and I agree with the above.

## 2020-09-09 NOTE — Progress Notes (Signed)
Patient is a 70 year old female here for follow-up after right breast lumpectomy by Dr. Ninfa Linden followed by bilateral oncoplastic breast reduction by Dr. [name] on 09/02/2020.  She called the office today reporting that she was concerned about swelling of her right breast and her JP drain tube not working and would like to be seen.  She is here today with her sister.  Chaperone present on exam On exam bilateral breast incisions are intact, Steri-Strips and honeycomb dressings are in place.  She does have a Xeroform bolster over the left NAC.  Bilateral JP drains are in place, serosanguineous drainage noted in bilateral bulbs.  Right NAC with good color, good capillary refill, viable.  She has some drainage on the honeycomb dressing, as expected.  There is no erythema or cellulitic changes noted.  Bilateral breasts are soft, right is larger than left, some positive fluid wave noted with palpation.  No drainage noted within the JP drain tube on the right side.   When palpating the right breast, I do feel a fluid wave, however was unable to express any fluid from the drainage tube.  I do recommend continue her compressive garment 24/7 to help decrease swelling.  I discussed with the patient that sometimes swelling can cause changes in breast size during the recovery period, but I do not feel as if there is any signs of concern at this time.  Overall everything appears to be healing well.  I recommend she continue to avoid strenuous activity.  She is scheduled to follow-up next week, we will plan to remove the bolster of the left NAC at that time and also remove the JP drains depending on her output.  There is no sign of infection.

## 2020-09-09 NOTE — Telephone Encounter (Signed)
Merleen Nicely called from Dr. Geralyn Flash office at the [facility] to say that Sheri Sims called their office to say that her tubes are not draining.  Merleen Nicely said that she needed to call our office.  Merleen Nicely asked that we please call the patient.

## 2020-09-09 NOTE — Telephone Encounter (Addendum)
Patient called to say that last night she noticed a difference and this morning she thinks the drainage tube on her right side has stopped-up or something.  Patient said it's coming apart or something is happening in there.  She said she needs to see someone as soon as possible.  Patient said that right now, her breast is a little discomforted, but she is not in a lot of pain on that side.  Patient does feel like she needs to be seen quickly.  Patient said that she would like to see Dr. [name] as she has not had a chance to talk with her.  Patient said that she has questions and would like to talk specifically with Dr. [name] if at all possible.  Patient called back to say that her right breast is swollen and hurting and her drainage tube is not working.  Patient said she needs to see someone as soon as possible.  Please call.

## 2020-09-09 NOTE — Telephone Encounter (Signed)
Ordered oncotype per Dr. Gudena. Faxed requisition to pathology. °

## 2020-09-09 NOTE — Telephone Encounter (Signed)
Patient came in office for observation with [facility] today

## 2020-09-09 NOTE — Telephone Encounter (Signed)
Returned patients call. She is extremely concerned about her right breast being swollen, very painful, and feels like the right tube is clogged. She had a lumpectomy on 3/3 with Dr. Ninfa Linden, BL breast reduction with Dr. [name]. Last night the right bulb  did not have much in it. There was some at the bottom of the tube, but nothing at the top. She indicated it looks like something "metal" at the top of the right tubing.  3/4 L 35 R48 3/5 L 15 R26 3/6 L25 R18 3/7 L35 R6 3/8 L33 R22 3/9 L24 R16 3/10 L40 R 2-3 ML  Main concern Right breast swollen and tube not working properly

## 2020-09-09 NOTE — Telephone Encounter (Signed)
Pt coming in for eval.

## 2020-09-10 ENCOUNTER — Inpatient Hospital Stay: Payer: Medicare PPO | Attending: Hematology and Oncology | Admitting: Hematology and Oncology

## 2020-09-10 ENCOUNTER — Telehealth: Payer: Self-pay | Admitting: Plastic Surgery

## 2020-09-10 ENCOUNTER — Encounter: Payer: Self-pay | Admitting: *Deleted

## 2020-09-10 ENCOUNTER — Other Ambulatory Visit: Payer: Self-pay

## 2020-09-10 DIAGNOSIS — Z79899 Other long term (current) drug therapy: Secondary | ICD-10-CM | POA: Diagnosis not present

## 2020-09-10 DIAGNOSIS — C50411 Malignant neoplasm of upper-outer quadrant of right female breast: Secondary | ICD-10-CM | POA: Insufficient documentation

## 2020-09-10 DIAGNOSIS — Z170 Estrogen receptor positive status [ER+]: Secondary | ICD-10-CM | POA: Diagnosis not present

## 2020-09-10 DIAGNOSIS — Z7901 Long term (current) use of anticoagulants: Secondary | ICD-10-CM | POA: Diagnosis not present

## 2020-09-10 NOTE — Assessment & Plan Note (Signed)
09/02/20: Bilateral breast reduction and right lumpectomy ([place]): multifocal IDC grade 2, 1.3cm, with DCIS, clear margins. ER 95%, PR 95%, HER2 negative, Ki-67 10%  Pathology counseling: I discussed the final pathology report of the patient provided  a copy of this report. I discussed the margins as well as lymph node surgeries. We also discussed the final staging along with previously performed ER/PR and HER-2/neu testing.  Treatment Plan: 1. Oncotype DX testing to determine if chemotherapy would be of any benefit followed by 2. Adjuvant radiation therapy followed by 3. Adjuvant antiestrogen therapy  Return to clinic based upon Oncotype test results

## 2020-09-10 NOTE — Telephone Encounter (Signed)
Called to check on patient.  Doing well. Planning to come in next week for postop visit.

## 2020-09-14 ENCOUNTER — Other Ambulatory Visit: Payer: Self-pay

## 2020-09-14 ENCOUNTER — Ambulatory Visit (INDEPENDENT_AMBULATORY_CARE_PROVIDER_SITE_OTHER): Payer: Medicare PPO | Admitting: Plastic Surgery

## 2020-09-14 ENCOUNTER — Encounter: Payer: Self-pay | Admitting: Plastic Surgery

## 2020-09-14 VITALS — BP 135/84 | HR 73

## 2020-09-14 DIAGNOSIS — C50411 Malignant neoplasm of upper-outer quadrant of right female breast: Secondary | ICD-10-CM

## 2020-09-14 DIAGNOSIS — C50911 Malignant neoplasm of unspecified site of right female breast: Secondary | ICD-10-CM | POA: Diagnosis not present

## 2020-09-14 DIAGNOSIS — Z853 Personal history of malignant neoplasm of breast: Secondary | ICD-10-CM | POA: Diagnosis not present

## 2020-09-14 DIAGNOSIS — Z170 Estrogen receptor positive status [ER+]: Secondary | ICD-10-CM

## 2020-09-14 NOTE — Progress Notes (Signed)
   Subjective:    Patient ID: Sheri Sims, female    DOB: [birth year], 70 y.o.   MRN: [id number]  The patient is a 70 year old female here with her sister after bilateral oncoplastic breast reduction.  We removed between 7 and 900 g from the breasts.  She has nice symmetry.  A skin graft was done to the nipple areola on the left.  I was able to remove the bolster.  There is no sign of hematoma or seroma.  No sign of infection.  There is good capillary refill to the right breast.     Review of Systems  Constitutional: Negative.   HENT: Negative.   Eyes: Negative.   Respiratory: Negative.   Genitourinary: Negative.        Objective:   Physical Exam Vitals and nursing note reviewed.  Constitutional:      Appearance: Normal appearance.  HENT:     Head: Normocephalic and atraumatic.  Cardiovascular:     Rate and Rhythm: Normal rate.     Pulses: Normal pulses.  Skin:    Capillary Refill: Capillary refill takes less than 2 seconds.  Neurological:     General: No focal deficit present.     Mental Status: She is alert. Mental status is at baseline.  Psychiatric:        Mood and Affect: Mood normal.        Behavior: Behavior normal.         Assessment & Plan:     ICD-10-CM   1. Malignant neoplasm of upper-outer quadrant of right breast in female, estrogen receptor positive ([place])  C50.411    Z17.0     Bilateral drains were removed.  We will plan on removing the dressings at her next visit continue with Xeroform on the left breast to change it every other do not shower until Saturday.  Follow-up in 2 weeks.

## 2020-09-21 ENCOUNTER — Telehealth: Payer: Self-pay

## 2020-09-21 ENCOUNTER — Encounter: Payer: Self-pay | Admitting: Plastic Surgery

## 2020-09-21 NOTE — Telephone Encounter (Signed)
Returned patients call. Per Dr. [name], she cam use Vaseline and gauze until her next appointment, 10/01/2020. Patient also mentioned she noticed this morning when changing dressings there are 2-3 new red/raw spots on her nipple/areola area on her left breast. Denied any pain, fever, chills, nausea, nor vomiting. She has some discomfort BL at [facility] along when moving around alot. The compression bra that she recently purchased doesn't feel quite as tight as it should be. There is some relief when she physically lifts her breast up. Advised patient, can use ABD pads, or wear a tank top under the sports bra to avoid any irritation at the IMF. For more support, she can additionally use a ace wrap for more compression, or buy better fitted sports bra. She is sending pictures of her red/raw spots on her left breast via MyChart for observation. Will call her after it has been reviewed by [facility]. Patient understood and agreed.

## 2020-09-21 NOTE — Telephone Encounter (Signed)
Patient called to say that she only has enough Curad Xeroform [place] dressing to last until Monday (09/27/2020).  Patient said her last application will be on Saturday (09/25/2020).  Patient said her next appointment with [place] will be on Friday (10/01/2020).  Please call.

## 2020-09-22 ENCOUNTER — Encounter: Payer: Self-pay | Admitting: *Deleted

## 2020-09-24 DIAGNOSIS — Z170 Estrogen receptor positive status [ER+]: Secondary | ICD-10-CM | POA: Diagnosis not present

## 2020-09-24 DIAGNOSIS — C50411 Malignant neoplasm of upper-outer quadrant of right female breast: Secondary | ICD-10-CM | POA: Diagnosis not present

## 2020-09-27 ENCOUNTER — Encounter: Payer: Self-pay | Admitting: *Deleted

## 2020-09-27 ENCOUNTER — Telehealth: Payer: Self-pay | Admitting: *Deleted

## 2020-09-27 DIAGNOSIS — C50411 Malignant neoplasm of upper-outer quadrant of right female breast: Secondary | ICD-10-CM

## 2020-09-27 DIAGNOSIS — Z170 Estrogen receptor positive status [ER+]: Secondary | ICD-10-CM

## 2020-09-27 NOTE — Telephone Encounter (Signed)
Received oncotype results of 9/3%.  Left message for a return phone call to inform patient she does not require chemo.  Referral placed for Dr. Sondra Come. Team notified.

## 2020-09-28 NOTE — Progress Notes (Signed)
PATIENT DECIDED NOT TO PURSUE RADIATION TREATMENT    Location of Breast Cancer:right breast  Histology per Pathology Report:  09/02/2020   Receptor Status:  09/02/2020   Did patient present with symptoms (if so, please note symptoms) or was this found on screening mammography?:    Past/Anticipated interventions by surgeon, if any: RIGHT BREAST LUMPECTOMY WITH RADIOACTIVE SEED LOCALIZATION on 09/02/2020 by Dr Coralie Keens   Past/Anticipated interventions by medical oncology, if any: referred by Dr Lindi Adie    Lymphedema issues, if any:      Pain issues, if any:     SAFETY ISSUES: Prior radiation?  Pacemaker/ICD?  Possible current pregnancy?no Is the patient on methotrexate?   Current Complaints / other details:

## 2020-09-30 ENCOUNTER — Encounter: Payer: Medicare PPO | Admitting: Surgical

## 2020-09-30 ENCOUNTER — Encounter (HOSPITAL_COMMUNITY): Payer: Self-pay

## 2020-10-01 ENCOUNTER — Encounter: Payer: Self-pay | Admitting: Surgical

## 2020-10-01 ENCOUNTER — Ambulatory Visit (INDEPENDENT_AMBULATORY_CARE_PROVIDER_SITE_OTHER): Payer: Medicare PPO | Admitting: Surgical

## 2020-10-01 ENCOUNTER — Other Ambulatory Visit: Payer: Self-pay

## 2020-10-01 VITALS — BP 109/68 | HR 68

## 2020-10-01 DIAGNOSIS — C50411 Malignant neoplasm of upper-outer quadrant of right female breast: Secondary | ICD-10-CM

## 2020-10-01 DIAGNOSIS — M546 Pain in thoracic spine: Secondary | ICD-10-CM

## 2020-10-01 DIAGNOSIS — Z170 Estrogen receptor positive status [ER+]: Secondary | ICD-10-CM

## 2020-10-01 DIAGNOSIS — G8929 Other chronic pain: Secondary | ICD-10-CM

## 2020-10-01 NOTE — Progress Notes (Signed)
Patient is a 70 year old female here for follow-up after bilateral oncoplastic breast reduction with Dr. [name] after right breast lumpectomy by Dr. Ninfa Linden on 09/02/2020.  She is 1 month postop.  She underwent inferior pedicle technique of the right breast with amputation technique of the left breast.  Patient reports she finished her antibiotic which was prescribed over the weekend by Dr. [name] for redness of the left nipple areolar complex.  She reports that she is doing a lot better after completing the antibiotic.  She is not having any infectious symptoms.  She is very pleased with how things are going so far.  She has some questions about restrictions  Chaperone present on exam On exam bilateral breast incisions are intact.  Left NAC is healing nicely.  She has some areas of sloughing epithelium/scabbing but no cellulitic changes, foul odors or purulent drainage is noted.  Right NAC is viable with good color and capillary refill.  There is no erythema of the right breast.  No subcutaneous fluid collections noted.  Recommend continue her compressive garment 24/7 for 2 more weeks. Recommend avoiding heavy lifting or strenuous activity for 2 more weeks. No sign of infection, seroma, hematoma. Recommend following up in 3 weeks for reevaluation. All of her questions were answered to her content.

## 2020-10-04 ENCOUNTER — Telehealth: Payer: Self-pay | Admitting: *Deleted

## 2020-10-04 ENCOUNTER — Encounter: Payer: Self-pay | Admitting: *Deleted

## 2020-10-04 NOTE — Telephone Encounter (Signed)
Received call from pt stating she has decided against radiation therapy.  Pt requesting to be seen by MD in one month to discuss antiestrogen therapy. Apt scheduled, pt verbalized understanding of date and time.

## 2020-10-06 ENCOUNTER — Ambulatory Visit: Payer: Medicare PPO | Admitting: Radiation Oncology

## 2020-10-06 ENCOUNTER — Ambulatory Visit
Admission: RE | Admit: 2020-10-06 | Discharge: 2020-10-06 | Disposition: A | Discharge status: Home / Self Care | Payer: Medicare PPO | Source: Ambulatory Visit | Attending: Radiation Oncology | Admitting: Radiation Oncology

## 2020-10-06 ENCOUNTER — Ambulatory Visit: Payer: Medicare PPO

## 2020-10-06 DIAGNOSIS — H2511 Age-related nuclear cataract, right eye: Secondary | ICD-10-CM | POA: Diagnosis not present

## 2020-10-06 DIAGNOSIS — H353211 Exudative age-related macular degeneration, right eye, with active choroidal neovascularization: Secondary | ICD-10-CM | POA: Diagnosis not present

## 2020-10-06 DIAGNOSIS — H43813 Vitreous degeneration, bilateral: Secondary | ICD-10-CM | POA: Diagnosis not present

## 2020-10-06 DIAGNOSIS — D3122 Benign neoplasm of left retina: Secondary | ICD-10-CM | POA: Diagnosis not present

## 2020-10-12 ENCOUNTER — Encounter: Payer: Self-pay | Admitting: Hematology and Oncology

## 2020-10-12 ENCOUNTER — Encounter: Payer: Self-pay | Admitting: *Deleted

## 2020-10-21 ENCOUNTER — Other Ambulatory Visit: Payer: Self-pay | Admitting: Internal Medicine

## 2020-10-21 NOTE — Telephone Encounter (Signed)
Prescription refill request for Xarelto received.  Indication: PAF Last office visit: 06/07/20 Weight: 83.7kg Age: 70 Scr: 0.63 on 08/30/20 CrCl: 106.65  Based on above findings Xarelto 20mg  once daily is the appropriate dose.  Refill approved.

## 2020-10-27 ENCOUNTER — Telehealth: Payer: Self-pay | Admitting: Internal Medicine

## 2020-10-27 NOTE — Telephone Encounter (Signed)
Pt c/o medication issue:  1. Name of Medication: XARELTO 20 MG TABS tablet  2. How are you currently taking this medication (dosage and times per day)? No longer taking it    3. Are you having a reaction (difficulty breathing--STAT)? no  4. What is your medication issue? Pt is calling requesting that she receives a callback.She states she is no longer taking this medication and wants to confirm it is ok for her not to take it.She also states she has been doing fine without it.Please advise

## 2020-10-27 NOTE — Telephone Encounter (Signed)
Reviewed Dr. Rayann Heman last [place] note from December. Returned call to pt. Pt reports that she has been off Xarelto since January. Informed that per his last note she is ok to stop the blood thinner, but will get verbal confirmation from MD. Aware I will forward to Dr. Rayann Heman and his nurse to address next week (pt made aware MD is out of the office until next week). Aware nurse will follow up w/ verbal confirmation that is ok to remain off blood thinner. (aware this may be sent via mychart) Patient verbalized understanding and agreeable to plan.

## 2020-10-28 NOTE — Telephone Encounter (Signed)
From OV note:  We discussed that guidelines would support her stopping anticoagulation. She is reluctant to change today  Based on this note it is okay to stop anticoagulation. It appears the patient stopped shortly after this visit. Will remove from medication list.

## 2020-11-04 NOTE — Progress Notes (Signed)
Patient Care Team: Aletha Halim., PA-C as PCP - General (Family Medicine) Deboraha Sprang, MD as PCP - Cardiology (Cardiology) Mauro Kaufmann, RN as Oncology Nurse Navigator Rockwell Germany, RN as Oncology Nurse Navigator Nicholas Lose, MD as Consulting Physician (Hematology and Oncology) Coralie Keens, MD as Consulting Physician (General Surgery) Gery Pray, MD as Consulting Physician (Radiation Oncology)  DIAGNOSIS:    ICD-10-CM   1. Malignant neoplasm of upper-outer quadrant of right breast in female, estrogen receptor positive ([place])  C50.411    Z17.0     SUMMARY OF ONCOLOGIC HISTORY: Oncology History  Malignant neoplasm of upper-outer quadrant of right breast in female, estrogen receptor positive ([place])  07/15/2020 Initial Diagnosis   Screening mammogram showed calcifications in the upper inner right breast and a 1.5cm architectural distortion. Diagnostic mammogram and US showed a 1.0cm upper outer right breast mass and a 0.7cm irregular area central to the nipple.  ER 95%, PR 95%, HER2 negative, Ki-67 10%   07/21/2020 Cancer Staging   Staging form: Breast, AJCC 8th Edition - Clinical stage from 07/21/2020: Stage IA (cT1b, cN0, cM0, G3, ER+, PR+, HER2-) - Signed by Nicholas Lose, MD on 07/21/2020   07/28/2020 Genetic Testing   Negative hereditary cancer genetic testing: no pathogenic variants detected in [facility] BRCAPl[facility] Panel.  The report date is July 28, 2020.   The BRCAplus panel offered by [employer] and includes sequencing and deletion/duplication analysis for the following 8 genes: ATM, BRCA1, BRCA2, CDH1, CHEK2, PALB2, PTEN, and TP53.  Negative hereditary cancer genetic testing: no pathogenic variants detected in [facility] CustomNext-Cancer +RNAinsight Panel.  The report date is August 01, 2020.   The CustomNext-Cancer+RNAinsight panel offered by [facility] includes sequencing and rearrangement analysis for the following 47 genes:  APC, ATM, AXIN2, BARD1,  BMPR1A, BRCA1, BRCA2, BRIP1, CDH1, CDK4, CDKN2A, CHEK2, DICER1, EPCAM, GREM1, HOXB13, MEN1, MLH1, MSH2, MSH3, MSH6, MUTYH, NBN, NF1, NF2, NTHL1, PALB2, PMS2, POLD1, POLE, PTEN, RAD51C, RAD51D, RECQL, RET, SDHA, SDHAF2, SDHB, SDHC, SDHD, SMAD4, SMARCA4, STK11, TP53, TSC1, TSC2, and VHL.  RNA data is routinely analyzed for use in variant interpretation for all genes.   09/02/2020 Surgery   Bilateral breast reduction and right lumpectomy ([place]): multifocal IDC grade 2, 1.3cm, with DCIS, clear margins.      CHIEF COMPLIANT: Follow-up to discuss antiestrogen therapy  INTERVAL HISTORY: Sheri Sims is a 70 y.o. with above-mentioned history of right breast cancer who underwent a right lumpectomy, declined radiation, and presents to the clinic today to discuss antiestrogen therapy.   ALLERGIES:  is allergic to codeine, sulfur dioxide, sulfites, codeine phosphate, eliquis [apixaban], other, and sulfa antibiotics.  MEDICATIONS:  Current Outpatient Medications  Medication Sig Dispense Refill  . acetaminophen (TYLENOL) 650 MG CR tablet Take 650 mg by mouth at bedtime.    . Calcium Carbonate (CALCIUM 600 PO) Take 1 tablet by mouth 3 (three) times a week.    . calcium carbonate (TUMS - DOSED IN MG ELEMENTAL CALCIUM) 500 MG chewable tablet Chew 1 tablet by mouth as needed for indigestion or heartburn.     . cetirizine (ZYRTEC) 10 MG tablet Take 10 mg by mouth at bedtime as needed (allergies).    . cholecalciferol (VITAMIN D) 1000 UNITS tablet Take 1,000 Units by mouth daily.    Marland Kitchen HYDROcodone-homatropine (HYCODAN) 5-1.5 MG/5ML syrup Take 5 mLs by mouth every 6 (six) hours as needed for cough. 120 mL 0  . loratadine (CLARITIN) 10 MG tablet Take 10 mg  by mouth daily as needed for allergies.    . metoprolol succinate (TOPROL-XL) 25 MG 24 hr tablet Take 1.5 tablets (37.5 mg total) by mouth daily. 135 tablet 3  . multivitamin (THERAGRAN) per tablet Take 1 tablet by mouth every other day.    Vladimir Faster Glycol-Propyl Glycol (SYSTANE ULTRA) 0.4-0.3 % SOLN Place 1 drop into both eyes 3 (three) times daily as needed (dry eyes).    . sodium chloride (OCEAN) 0.65 % SOLN nasal spray Place 1 spray into both nostrils as needed for congestion.    Marland Kitchen tobramycin (TOBREX) 0.3 % ophthalmic solution Place 1 drop into the right eye See admin instructions. For use on the day before, day of, and day after eye injection    . vitamin B-12 (CYANOCOBALAMIN) 500 MCG tablet Take 500 mcg by mouth every other day.     No current facility-administered medications for this visit.    PHYSICAL EXAMINATION: ECOG PERFORMANCE STATUS: 1 - Symptomatic but completely ambulatory  Vitals:   11/05/20 0923  BP: 111/63  Pulse: 69  Resp: 16  Temp: (!) 97.5 F (36.4 C)  SpO2: 99%   Filed Weights   11/05/20 0923  Weight: 177 lb 14.4 oz (80.7 kg)    LABORATORY DATA:  I have reviewed the data as listed CMP Latest Ref Rng & Units 08/30/2020 07/21/2020 11/10/2019  Glucose 70 - 99 mg/dL 71 70 147(H)  BUN 8 - 23 mg/dL '11 13 15  ' Creatinine 0.44 - 1.00 mg/dL 0.63 0.75 0.68  Sodium 135 - 145 mmol/L 140 142 138  Potassium 3.5 - 5.1 mmol/L 4.3 4.1 4.1  Chloride 98 - 111 mmol/L 105 108 103  CO2 22 - 32 mmol/L '28 27 26  ' Calcium 8.9 - 10.3 mg/dL 9.2 9.1 9.1  Total Protein 6.5 - 8.1 g/dL - 6.9 -  Total Bilirubin 0.3 - 1.2 mg/dL - 0.5 -  Alkaline Phos 38 - 126 U/L - 54 -  AST 15 - 41 U/L - 18 -  ALT 0 - 44 U/L - 15 -    Lab Results  Component Value Date   WBC 3.8 (L) 08/30/2020   HGB 14.7 08/30/2020   HCT 47.1 (H) 08/30/2020   MCV 100.6 (H) 08/30/2020   PLT 238 08/30/2020   NEUTROABS 2.7 07/21/2020    ASSESSMENT & PLAN:  Malignant neoplasm of upper-outer quadrant of right breast in female, estrogen receptor positive ([place]) 09/02/20: Bilateral breast reduction and right lumpectomy [facility]): multifocal IDC grade 2, 1.3cm, with DCIS, clear margins. ER 95%, PR 95%, HER2 negative, Ki-67 10% Oncotype Dx  score 9 (ROR 3%) Patient refused XRT  Treatment Plan: Adjuvant anti-estrogen therapy with Anastrozole 1 mg daily Anastrozole counseling: We discussed the risks and benefits of anti-estrogen therapy with aromatase inhibitors. These include but not limited to insomnia, hot flashes, mood changes, vaginal dryness, bone density loss, and weight gain. We strongly believe that the benefits far outweigh the risks. Patient understands these risks and consented to starting treatment. Planned treatment duration is 7 years.  She has fibromyalgia and has concerns about tolerability to anastrozole.  She will definitely give it a try. RTC in 3 months for SCP visit     No orders of the defined types were placed in this encounter.  The patient has a good understanding of the overall plan. she agrees with it. she will call with any problems that may develop before the next visit here.  Total time spent: 30 mins including face  to face time and time spent for planning, charting and coordination of care  Rulon Eisenmenger, MD, MPH 11/05/2020  I, Molly Dorshimer, am acting as scribe for Dr. Nicholas Lose.  I have reviewed the above documentation for accuracy and completeness, and I agree with the above.

## 2020-11-04 NOTE — Assessment & Plan Note (Signed)
09/02/20: Bilateral breast reduction and right lumpectomy ([place]): multifocal IDC grade 2, 1.3cm, with DCIS, clear margins. ER 95%, PR 95%, HER2 negative, Ki-67 10% Oncotype Dx score 9 (ROR 3%) Patient refused XRT  Treatment Plan: Adjuvant anti-estrogen therapy with Anastrozole 1 mg daily Anastrozole counseling: We discussed the risks and benefits of anti-estrogen therapy with aromatase inhibitors. These include but not limited to insomnia, hot flashes, mood changes, vaginal dryness, bone density loss, and weight gain. We strongly believe that the benefits far outweigh the risks. Patient understands these risks and consented to starting treatment. Planned treatment duration is 7 years.  RTC in 3 months for SCP visit

## 2020-11-05 ENCOUNTER — Other Ambulatory Visit: Payer: Self-pay

## 2020-11-05 ENCOUNTER — Ambulatory Visit (INDEPENDENT_AMBULATORY_CARE_PROVIDER_SITE_OTHER): Payer: Medicare PPO | Admitting: Surgical

## 2020-11-05 ENCOUNTER — Inpatient Hospital Stay: Payer: Medicare PPO | Attending: Hematology and Oncology | Admitting: Hematology and Oncology

## 2020-11-05 DIAGNOSIS — Z170 Estrogen receptor positive status [ER+]: Secondary | ICD-10-CM

## 2020-11-05 DIAGNOSIS — C50411 Malignant neoplasm of upper-outer quadrant of right female breast: Secondary | ICD-10-CM | POA: Diagnosis not present

## 2020-11-05 DIAGNOSIS — G8929 Other chronic pain: Secondary | ICD-10-CM

## 2020-11-05 DIAGNOSIS — M797 Fibromyalgia: Secondary | ICD-10-CM | POA: Diagnosis not present

## 2020-11-05 DIAGNOSIS — M546 Pain in thoracic spine: Secondary | ICD-10-CM

## 2020-11-05 MED ORDER — ANASTROZOLE 1 MG PO TABS: 1.0000 mg | ORAL_TABLET | Freq: Every day | ORAL | 3 refills | Status: DC

## 2020-11-05 NOTE — Progress Notes (Signed)
Patient is a 70 year old female here for follow-up after bilateral oncoplastic breast reduction with Dr. [name] after right breast lumpectomy by Dr. Ninfa Linden on 09/02/2020.  She is 2 months postop.  She underwent inferior pedicle technique of the right breast with amputation technique of the left breast.  She reports overall she is doing really well.  She reports some occasional tenderness and some residual fatigue from surgery but attributes this to her fibromyalgia as well.  Chaperone present on exam On exam bilateral breast incisions are well-healed.  Bilateral NAC's are healed.  She does have some newly healed skin over the left NAC graft.  No wounds are noted.  No erythema noted.  No subcutaneous fluid collection noted with palpation.  No cellulitic changes.  Recommend continue to wear sports bra for 1 more month then she can transition to wearing a normal bra without an underwire. We discussed scheduling additional follow-up or following up on an as-needed basis and she feels comfortable following up as needed.  Picture was taken and placed in the patient's chart with patient's permission. Recommend calling with questions or concerns

## 2020-11-11 ENCOUNTER — Ambulatory Visit: Payer: Medicare PPO | Admitting: Internal Medicine

## 2020-11-11 ENCOUNTER — Other Ambulatory Visit: Payer: Self-pay | Admitting: Internal Medicine

## 2020-11-11 ENCOUNTER — Ambulatory Visit (INDEPENDENT_AMBULATORY_CARE_PROVIDER_SITE_OTHER): Payer: Medicare PPO

## 2020-11-11 ENCOUNTER — Other Ambulatory Visit: Payer: Self-pay

## 2020-11-11 ENCOUNTER — Encounter: Payer: Self-pay | Admitting: Internal Medicine

## 2020-11-11 DIAGNOSIS — R918 Other nonspecific abnormal finding of lung field: Secondary | ICD-10-CM

## 2020-11-11 NOTE — Progress Notes (Signed)
Subjective:   Patient ID: Sheri Sims, female    DOB: August 01, 1950   MRN: [id number]  Brief patient profile:  70 yowf never smoker bad pna age 70 but did fine thereafter except for seasonal allergies responding to otcs with abrupt R side pleuritic cp/ chills Nov 03 2013 > [place]  ER > dx RML pna admit x 2 days > complete resolution of R pain, chills and fever resolved but persistent fatigue and infiltrates RML gradually improving  But still present on  cxr 01/30/14 so referred to pulmonary clinic 02/13/2014 by Dr Kathryne Eriksson    History of Present Illness  02/13/2014 1st [facility] office visit/ Sheri Sims  Chief Complaint  Patient presents with  . Pulmonary Consult    Referred per Dr. Kathryne Eriksson for eval of abn cxr. Pt states that she was dxed with PNA 4 months ago.  She c/o SOB with walking up and down stairs and if she bends over alot.   overall pattern was very abrupt onset of classic R lat pleuritic cp and much better by the time she was discharged and slow but steady improvement  in all symptoms since then . Never had hemoptysis or purulent sputum - more fatigue limiting activity than sob.  Dx RML syndrome rec No change rx     08/19/2018 acute extended ov/Sheri Sims re: acute cough  Chief Complaint  Patient presents with  . Acute Visit    Pt c/o cough since 08/09/2018. Cough is currently prod with green to yellow sputum. She also c/o runny nose and sneezin.  husband was sick then 2/7 pt noted acute onset sore throat cough burning/ raw sensation over ant chest exp when out in cold  Only sob with cough Cough worse in evenings and with exp to  Cold air/   But sleeps ok / worse first thing in am rec Augmentin 875 mg take one pill twice daily  X 10 days   If not better >   Prednisone 10 mg take  4 each am x 2 days,   2 each am x 2 days,  1 each am x 2 days and stop  GERD diet For cough > hycodan up to a tsp every 4 hours as needed    11/11/2020  f/u ov/Sheri Sims re:  F/u nodules Chief  Complaint  Patient presents with  . Follow-up    CXR done today. Breathing is overall doing well.    Dyspnea:  Not limited by breathing from desired activities   Cough: none  Sleeping: flat bed / one pillow no resp cc SABA use: none  02: none  Covid status:  vax x 2  09/08/19    No obvious day to day or daytime variability or assoc excess/ purulent sputum or mucus plugs or hemoptysis or cp or chest tightness, subjective wheeze or overt sinus or hb symptoms.   Sleeping fine as above without nocturnal  or early am exacerbation  of respiratory  c/o's or need for noct saba. Also denies any obvious fluctuation of symptoms with weather or environmental changes or other aggravating or alleviating factors except as outlined above   No unusual exposure hx or h/o childhood pna/ asthma or knowledge of premature birth.  Current Allergies, Complete Past Medical History, Past Surgical History, Family History, and Social History were reviewed in [employer] record.  ROS  The following are not active complaints unless bolded Hoarseness, sore throat, dysphagia, dental problems, itching, sneezing,  nasal congestion or discharge  of excess mucus or purulent secretions, ear ache,   fever, chills, sweats, unintended wt loss or wt gain, classically pleuritic or exertional cp,  orthopnea pnd or arm/hand swelling  or leg swelling, presyncope, palpitations, abdominal pain, anorexia, nausea, vomiting, diarrhea  or change in bowel habits or change in bladder habits, change in stools or change in urine, dysuria, hematuria,  rash, arthralgias, visual complaints, headache, numbness, weakness or ataxia or problems with walking or coordination,  change in mood or  memory.        Current Meds  Medication Sig  . acetaminophen (TYLENOL) 650 MG CR tablet Take 650 mg by mouth at bedtime.  Marland Kitchen anastrozole (ARIMIDEX) 1 MG tablet Take 1 tablet (1 mg total) by mouth daily.  . Calcium Carbonate (CALCIUM 600 PO)  Take 1 tablet by mouth 3 (three) times a week.  . calcium carbonate (TUMS - DOSED IN MG ELEMENTAL CALCIUM) 500 MG chewable tablet Chew 1 tablet by mouth as needed for indigestion or heartburn.   . cholecalciferol (VITAMIN D) 1000 UNITS tablet Take 1,000 Units by mouth daily.  Marland Kitchen loratadine (CLARITIN) 10 MG tablet Take 10 mg by mouth daily as needed for allergies.  . metoprolol succinate (TOPROL-XL) 25 MG 24 hr tablet Take 1.5 tablets (37.5 mg total) by mouth daily.  . multivitamin (THERAGRAN) per tablet Take 1 tablet by mouth every other day.  Vladimir Faster Glycol-Propyl Glycol (SYSTANE ULTRA) 0.4-0.3 % SOLN Place 1 drop into both eyes 3 (three) times daily as needed (dry eyes).  . sodium chloride (OCEAN) 0.65 % SOLN nasal spray Place 1 spray into both nostrils as needed for congestion.  Marland Kitchen tobramycin (TOBREX) 0.3 % ophthalmic solution Place 1 drop into the right eye See admin instructions. For use on the day before, day of, and day after eye injection  . vitamin B-12 (CYANOCOBALAMIN) 500 MCG tablet Take 500 mcg by mouth every other day.                   Objective:   Physical Exam    11/11/2020       177 08/19/2018       183 05/01/2018     183  09/03/2017         179  11/15/2016       179  10/12/2016       176  03/24/14           188    Vital signs reviewed  11/11/2020  - Note at rest 02 sats  97% on RA   General appearance:    Amb elderly wf nad    HEENT : pt wearing mask not removed for exam due to covid -19 concerns.    NECK :  without JVD/Nodes/TM/ nl carotid upstrokes bilaterally   LUNGS: no acc muscle use,  Nl contour chest which is clear to A and P bilaterally without cough on insp or exp maneuvers   CV:  RRR  no s3 or murmur or increase in P2, and no edema   ABD:  soft and nontender with nl inspiratory excursion in the supine position. No bruits or organomegaly appreciated, bowel sounds nl  MS:  Nl gait/ ext warm without deformities, calf tenderness, cyanosis or  clubbing No obvious joint restrictions   SKIN: warm and dry without lesions    NEURO:  alert, approp, nl sensorium with  no motor or cerebellar deficits apparent.       CXR PA and Lateral:   11/11/2020 :  I personally reviewed images and agree with radiology impression as follows:  Persisting nodular appearance at the right lung base, may represent the same process as previously described on CT or new atypical infection    Assessment & Plan:

## 2020-11-11 NOTE — Patient Instructions (Signed)
Please schedule a follow up visit in 12  months but call sooner if needed  

## 2020-11-12 ENCOUNTER — Encounter: Payer: Self-pay | Admitting: Internal Medicine

## 2020-11-13 ENCOUNTER — Encounter: Payer: Self-pay | Admitting: Internal Medicine

## 2020-11-13 NOTE — Assessment & Plan Note (Signed)
Acute onset symptoms May 2015 ? RML syndrome    - ESR 02/13/2014 = 20 - CT chest  03/31/14 Largely wedge shaped peripheral airspace opacity in the right middle lobe most likely post pneumonic atelectasis.   - CT 07/07/2014 >  01/18/2015 no change  - CT 03/15/2016 1. Essentially stable appearance of an irregular shaped mass-like area in the lateral segment of the right middle lobe, which now demonstrates 2 years of stability. This is strongly favored to represent a benign area, likely chronic mucoid impaction within dilated distal bronchi with surrounding scarring and atelectasis.> no f/u needed unless new symptoms - CTa 10/06/16 1.No pulmonary embolism. 2.Focal consolidation in the right upper lobe most likely pneumonia. 3.Slightly decreased size of a masslike opacity in the right middle lobe compared to CT from 2015. This is favored to represent sequela of prior infection.  - HRCT  10/27/2016  Lesion is well demonstrated on coronal and sagittal reconstructions and when comparing to 03/31/2014, the lesion measures 1.2 x 1.8 x 2.4 cm today compared to 1.5 x 2.2 x 2.6 cm on 03/31/2014.  RUL process has completely resolved c/w CAP  - recurrent R chest wall pain 05/01/2018 > rx empirically with 10  Days augmentin - CT chest 03/15/20  1. Clustered solid pulmonary nodules in a branching configuration in the posterior right lower lobe, new in the short interval since 11/03/2019 CT, probably infectious/inflammatory. Follow-up chest CT recommended in 3-6 months. 2. Previously described clustered solid nodules in the medial left lower lobe are decreased since 11/03/2019 CT, compatible with resolving benign inflammatory nodularity. - cxr 11/11/2020 no change   No radiographic changes and no new or even active respiratory complaints reassuring as to the likelihood of a benign process such as low grade MAI in a nl host > f/u yearly is fine  Discussed in detail all the  indications, usual  risks and  alternatives  relative to the benefits with patient who agrees to proceed with conservative f/u as outlined           Each maintenance medication was reviewed in detail including emphasizing most importantly the difference between maintenance and prns and under what circumstances the prns are to be triggered using an action plan format where appropriate.  Total time for H and P, chart review, counseling,  and generating customized AVS unique to this office visit / same day charting = 24 min

## 2020-12-15 DIAGNOSIS — H353211 Exudative age-related macular degeneration, right eye, with active choroidal neovascularization: Secondary | ICD-10-CM | POA: Diagnosis not present

## 2020-12-15 DIAGNOSIS — H35371 Puckering of macula, right eye: Secondary | ICD-10-CM | POA: Diagnosis not present

## 2020-12-15 DIAGNOSIS — H353121 Nonexudative age-related macular degeneration, left eye, early dry stage: Secondary | ICD-10-CM | POA: Diagnosis not present

## 2020-12-15 DIAGNOSIS — D3132 Benign neoplasm of left choroid: Secondary | ICD-10-CM | POA: Diagnosis not present

## 2021-01-28 ENCOUNTER — Telehealth: Payer: Self-pay | Admitting: Adult Health

## 2021-01-28 NOTE — Telephone Encounter (Signed)
Patient called to cancel upcoming appointment. Patient stated she was pursuing alternative medicine.

## 2021-02-03 ENCOUNTER — Encounter: Payer: Medicare PPO | Admitting: Adult Health

## 2021-03-02 ENCOUNTER — Other Ambulatory Visit: Payer: Self-pay | Admitting: Internal Medicine

## 2021-03-09 DIAGNOSIS — H353211 Exudative age-related macular degeneration, right eye, with active choroidal neovascularization: Secondary | ICD-10-CM | POA: Diagnosis not present

## 2021-03-09 DIAGNOSIS — H353121 Nonexudative age-related macular degeneration, left eye, early dry stage: Secondary | ICD-10-CM | POA: Diagnosis not present

## 2021-03-09 DIAGNOSIS — H35371 Puckering of macula, right eye: Secondary | ICD-10-CM | POA: Diagnosis not present

## 2021-03-09 DIAGNOSIS — H43812 Vitreous degeneration, left eye: Secondary | ICD-10-CM | POA: Diagnosis not present

## 2021-03-14 ENCOUNTER — Ambulatory Visit: Payer: Medicare PPO | Admitting: Internal Medicine

## 2021-03-14 ENCOUNTER — Encounter: Payer: Self-pay | Admitting: Internal Medicine

## 2021-03-14 ENCOUNTER — Other Ambulatory Visit: Payer: Self-pay

## 2021-03-14 VITALS — BP 116/72 | HR 85 | Ht 65.0 in | Wt 174.2 lb

## 2021-03-14 DIAGNOSIS — I480 Paroxysmal atrial fibrillation: Secondary | ICD-10-CM | POA: Diagnosis not present

## 2021-03-14 DIAGNOSIS — I491 Atrial premature depolarization: Secondary | ICD-10-CM | POA: Diagnosis not present

## 2021-03-14 DIAGNOSIS — I483 Typical atrial flutter: Secondary | ICD-10-CM

## 2021-03-14 NOTE — Patient Instructions (Addendum)
Medication Instructions:  Your physician recommends that you continue on your current medications as directed. Please refer to the Current Medication list given to you today.  Labwork: None ordered.  Testing/Procedures: None ordered.  Follow-Up: Your physician wants you to follow-up in: 6 months with the [facility]. They will contact you to schedule.    Any Other Special Instructions Will Be Listed Below (If Applicable).  If you need a refill on your cardiac medications before your next appointment, please call your pharmacy.

## 2021-03-14 NOTE — Progress Notes (Signed)
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 female who presents today for routine electrophysiology followup.  Since last being seen in our clinic, the patient reports doing very well.  She states "I had a better summer this year than I have had in several years".  She has intermittent fatigue in the setting of fibromyalgia.  She notices PACs/ PVCs more then.  No sustained afib post ablation.  She is very pleased with this.  Today, she denies symptoms of  chest pain, shortness of breath,  lower extremity edema, dizziness, presyncope, or syncope.  The patient is otherwise without complaint today.   Past Medical History:  Diagnosis Date   Arthritis    Atrial flutter ([place])    Cancer ([place])    breast cancer   Costochondritis    Enthesopathy of hip region    Family history of breast cancer 07/21/2020   Fatigue    Fibromyalgia    Helicobacter pylori infection    Insomnia    MVP (mitral valve prolapse)    OF ANTERIOR MITRAL VALVE LEAFLET   Obesity    Paroxysmal atrial fibrillation ([facility])    Pharyngitis    Pneumonia    Sinusitis, chronic    Past Surgical History:  Procedure Laterality Date   ABDOMINAL HYSTERECTOMY     APPENDECTOMY     ATRIAL FIBRILLATION ABLATION N/A 11/06/2019   Procedure: ATRIAL FIBRILLATION ABLATION;  Surgeon: Thompson Grayer, MD;  Location: [facility];  Service: Cardiovascular;  Laterality: N/A;   BREAST LUMPECTOMY     BREAST LUMPECTOMY WITH RADIOACTIVE SEED LOCALIZATION Right 09/02/2020   Procedure: RIGHT BREAST LUMPECTOMY WITH RADIOACTIVE SEED LOCALIZATION;  Surgeon: Coralie Keens, MD;  Location: [facility];  Service: General;  Laterality: Right;  LMA, COORD CASE W/ [place]-NEEDS 2 HOURS [place] NEEDS 1 HOUR   BREAST REDUCTION SURGERY Bilateral 09/02/2020   Procedure: BILATERAL ONCOPLASTIC BREAST REDUCTION;  Surgeon: Wallace Going, DO;  Location: [facility];  Service: Plastics;  Laterality: Bilateral;   CARDIAC  CATHETERIZATION  2010   Normal   CHOLECYSTECTOMY     STRESS ECHO TEST  05/31/2010   NO ISCHEMIA   TONSILLECTOMY     TRANSTHORACIC ECHOCARDIOGRAM  05/16/2010   EF 55-60%    ROS- all systems are reviewed and negatives except as per HPI above  Current Outpatient Medications  Medication Sig Dispense Refill   acetaminophen (TYLENOL) 650 MG CR tablet Take 650 mg by mouth at bedtime.     Calcium Carbonate (CALCIUM 600 PO) Take 1 tablet by mouth 3 (three) times a week.     calcium carbonate (TUMS - DOSED IN MG ELEMENTAL CALCIUM) 500 MG chewable tablet Chew 1 tablet by mouth as needed for indigestion or heartburn.      cholecalciferol (VITAMIN D) 1000 UNITS tablet Take 1,000 Units by mouth daily.     HYDROcodone bit-homatropine (HYCODAN) 5-1.5 MG/5ML syrup      loratadine (CLARITIN) 10 MG tablet Take 10 mg by mouth daily as needed for allergies.     metoprolol succinate (TOPROL-XL) 25 MG 24 hr tablet TAKE 1&1/2 TABLET BY MOUTH EVERY DAY 135 tablet 0   multivitamin (THERAGRAN) per tablet Take 1 tablet by mouth every other day.     Polyethyl Glycol-Propyl Glycol (SYSTANE ULTRA) 0.4-0.3 % SOLN Place 1 drop into both eyes 3 (three) times daily as needed (dry eyes).     sodium chloride (OCEAN) 0.65 % SOLN nasal spray Place  1 spray into both nostrils as needed for congestion.     tobramycin (TOBREX) 0.3 % ophthalmic solution Place 1 drop into the right eye See admin instructions. For use on the day before, day of, and day after eye injection     vitamin B-12 (CYANOCOBALAMIN) 500 MCG tablet Take 500 mcg by mouth every other day.     anastrozole (ARIMIDEX) 1 MG tablet Take 1 tablet (1 mg total) by mouth daily. (Patient not taking: Reported on 03/14/2021) 90 tablet 3   No current facility-administered medications for this visit.    Physical Exam: Vitals:   03/14/21 1043  BP: 116/72  Pulse: 85  Weight: 174 lb 3.2 oz (79 kg)  Height: '5\' 5"'$  (1.651 m)    GEN- The patient is well appearing, alert and  oriented x 3 today.   Head- normocephalic, atraumatic Eyes-  Sclera clear, conjunctiva pink Ears- hearing intact Oropharynx- clear Lungs- Clear to ausculation bilaterally, normal work of breathing Heart- Regular rate and rhythm, no murmurs, rubs or gallops, PMI not laterally displaced GI- soft, NT, ND, + BS Extremities- no clubbing, cyanosis, or edema  Wt Readings from Last 3 Encounters:  03/14/21 174 lb 3.2 oz (79 kg)  11/11/20 177 lb 6.4 oz (80.5 kg)  11/05/20 177 lb 14.4 oz (80.7 kg)    EKG tracing ordered today is personally reviewed and shows sinus with PACs  Assessment and Plan:  Paroxsymal atrial fibrillation/ atrial flutter/ PACs Doing well post ablation off AAD therapy Chads2vasc score is 2.  She has decided to not take [place].  This is supported by our guidelines. She has rare palpitations during episodes of fibromyalgia flare and fatigue.  Supportive measures including adequate sleep, hydration, pure diet and regular exercise are encouraged  AF clinic in 6 months Follow-up with Dr Caryl Comes in a year  Thompson Grayer MD, [facility] 03/14/2021 11:06 AM

## 2021-03-16 DIAGNOSIS — R0600 Dyspnea, unspecified: Secondary | ICD-10-CM | POA: Diagnosis not present

## 2021-03-16 DIAGNOSIS — Z20822 Contact with and (suspected) exposure to covid-19: Secondary | ICD-10-CM | POA: Diagnosis not present

## 2021-03-16 DIAGNOSIS — R0789 Other chest pain: Secondary | ICD-10-CM | POA: Diagnosis not present

## 2021-03-16 DIAGNOSIS — R5383 Other fatigue: Secondary | ICD-10-CM | POA: Diagnosis not present

## 2021-03-16 DIAGNOSIS — I4891 Unspecified atrial fibrillation: Secondary | ICD-10-CM | POA: Diagnosis not present

## 2021-03-16 DIAGNOSIS — R198 Other specified symptoms and signs involving the digestive system and abdomen: Secondary | ICD-10-CM | POA: Diagnosis not present

## 2021-03-16 DIAGNOSIS — R7989 Other specified abnormal findings of blood chemistry: Secondary | ICD-10-CM | POA: Diagnosis not present

## 2021-03-17 ENCOUNTER — Ambulatory Visit (HOSPITAL_BASED_OUTPATIENT_CLINIC_OR_DEPARTMENT_OTHER): Payer: Medicare PPO

## 2021-03-17 ENCOUNTER — Other Ambulatory Visit: Payer: Self-pay | Admitting: Family Medicine

## 2021-03-17 DIAGNOSIS — R7989 Other specified abnormal findings of blood chemistry: Secondary | ICD-10-CM

## 2021-03-17 DIAGNOSIS — R0609 Other forms of dyspnea: Secondary | ICD-10-CM

## 2021-03-17 DIAGNOSIS — R0600 Dyspnea, unspecified: Secondary | ICD-10-CM

## 2021-03-18 ENCOUNTER — Other Ambulatory Visit: Payer: Self-pay

## 2021-03-18 ENCOUNTER — Ambulatory Visit (HOSPITAL_BASED_OUTPATIENT_CLINIC_OR_DEPARTMENT_OTHER)
Admission: RE | Admit: 2021-03-18 | Discharge: 2021-03-18 | Disposition: A | Discharge status: Home / Self Care | Payer: Medicare PPO | Source: Ambulatory Visit | Attending: Family Medicine | Admitting: Family Medicine

## 2021-03-18 ENCOUNTER — Encounter (HOSPITAL_BASED_OUTPATIENT_CLINIC_OR_DEPARTMENT_OTHER): Payer: Self-pay

## 2021-03-18 DIAGNOSIS — J9811 Atelectasis: Secondary | ICD-10-CM | POA: Diagnosis not present

## 2021-03-18 DIAGNOSIS — R0600 Dyspnea, unspecified: Secondary | ICD-10-CM | POA: Insufficient documentation

## 2021-03-18 DIAGNOSIS — R0609 Other forms of dyspnea: Secondary | ICD-10-CM

## 2021-03-18 DIAGNOSIS — R7989 Other specified abnormal findings of blood chemistry: Secondary | ICD-10-CM | POA: Insufficient documentation

## 2021-03-18 DIAGNOSIS — I2699 Other pulmonary embolism without acute cor pulmonale: Secondary | ICD-10-CM | POA: Diagnosis not present

## 2021-03-18 MED ORDER — IOHEXOL 350 MG/ML SOLN
75.0000 mL | Freq: Once | INTRAVENOUS | Status: AC | PRN
  Administered 2021-03-18: 75 mL via INTRAVENOUS

## 2021-04-10 ENCOUNTER — Emergency Department
Admission: EM | Admit: 2021-04-10 | Discharge: 2021-04-10 | Disposition: A | Discharge status: Home / Self Care | Payer: Medicare PPO | Source: Home / Self Care | Attending: Family Medicine | Admitting: Family Medicine

## 2021-04-10 ENCOUNTER — Other Ambulatory Visit: Payer: Self-pay

## 2021-04-10 ENCOUNTER — Encounter: Payer: Self-pay | Admitting: Emergency Medicine

## 2021-04-10 DIAGNOSIS — J069 Acute upper respiratory infection, unspecified: Secondary | ICD-10-CM

## 2021-04-10 MED ORDER — PREDNISONE 20 MG PO TABS: 20.0000 mg | ORAL_TABLET | Freq: Two times a day (BID) | ORAL | 0 refills | Status: DC

## 2021-04-10 MED ORDER — TRAMADOL-ACETAMINOPHEN 37.5-325 MG PO TABS: 1.0000 | ORAL_TABLET | Freq: Four times a day (QID) | ORAL | 0 refills | Status: DC | PRN

## 2021-04-10 MED ORDER — AZITHROMYCIN 250 MG PO TABS: ORAL_TABLET | ORAL | 0 refills | Status: DC

## 2021-04-10 NOTE — ED Triage Notes (Signed)
Patient presents to Urgent Care with complaints of hoarseness since 2 days ago. Patient reports postnasal drainage, sinus pressure, stuffy nose, headache, and teeth pain. Took Tylenol for pain has helped. Had a slight temp 99.2 F. Does take Claritin daily.

## 2021-04-10 NOTE — Discharge Instructions (Signed)
Take prednisone as directed Take 2 doses today May take Tylenol for mild pain.  Take ultrasound if pain is severe May use saline (salt water) nasal spray  Fill and take the antibiotic if you fail to improve over the next week

## 2021-04-10 NOTE — ED Provider Notes (Signed)
[facility]    CSN: [id number] Arrival date & time: 04/10/21  1105      History   Chief Complaint Chief Complaint  Patient presents with   Facial Pain    HPI Sheri Sims is a 70 y.o. female.   HPI Patient states that she had the sudden onset of facial pain, postnasal drip, headache, pain from the sinuses into her upper teeth.  She takes Claritin daily for allergies.  She states her husband was seen for similar illness here 2 days ago.  He was treated with antibiotics and prednisone.  She states that his illness is different and that he had a "deep cough" that was productive.  Patient does not.  States she has had COVID within the last couple months.  Does not think this is COVID  Past Medical History:  Diagnosis Date   Arthritis    Atrial flutter ([place])    Cancer ([place])    breast cancer   Costochondritis    Enthesopathy of hip region    Family history of breast cancer 07/21/2020   Fatigue    Fibromyalgia    Helicobacter pylori infection    Insomnia    MVP (mitral valve prolapse)    OF ANTERIOR MITRAL VALVE LEAFLET   Obesity    Paroxysmal atrial fibrillation ([place])    Pharyngitis    Pneumonia    Sinusitis, chronic     Patient Active Problem List   Diagnosis Date Noted   Genetic testing 07/30/2020   Family history of breast cancer 07/21/2020   Malignant neoplasm of upper-outer quadrant of right breast in female, estrogen receptor positive ([place]) 07/15/2020   Paroxysmal atrial fibrillation ([place]) 09/22/2019   URI (upper respiratory infection) 08/20/2018   Pain in right knee 01/09/2018   Wheezing 11/05/2017   Mitral valve prolapse 08/02/2015   Arm pain 07/21/2015   Foot pain 07/21/2015   Awareness of heartbeats 07/21/2015   Osteopenia 07/21/2015   Symptoms involving urinary system 07/21/2015   Insomnia, persistent 07/21/2015   Abnormal chest x-ray 02/02/2015   Arthritis 02/02/2015   Calcaneal spur 02/02/2015   Cardiac conduction disorder 02/02/2015    Acid reflux 02/02/2015   Fatigue 02/02/2015   Heel pain 02/02/2015   HLD (hyperlipidemia) 02/02/2015   Idiopathic angio-edema-urticaria 02/02/2015   Elevated fasting blood sugar 02/02/2015   Encounter for general adult medical examination without abnormal findings 02/02/2015   Disorder of hematopoietic structure 02/02/2015   Back ache 02/02/2015   Pleurisy 02/02/2015   Posterior vitreous detachment 02/02/2015   Temporomandibular joint disorder 02/02/2015   Bilateral arm pain 02/02/2015   Pulmonary infiltrates 02/13/2014   Fibromyalgia 11/03/2013   Heart palpitations 11/03/2013   Acute respiratory failure with hypoxia ([place]) 11/03/2013   Chest pain, pleuritic 11/03/2013   Pneumonia 11/03/2013   Myalgia and myositis 07/11/2013   Osteoarthritis, hand 07/11/2013   Anxiety 03/28/2013   Positive antinuclear antibody 03/28/2013    Past Surgical History:  Procedure Laterality Date   ABDOMINAL HYSTERECTOMY     APPENDECTOMY     ATRIAL FIBRILLATION ABLATION N/A 11/06/2019   Procedure: ATRIAL FIBRILLATION ABLATION;  Surgeon: Thompson Grayer, MD;  Location: [facility];  Service: Cardiovascular;  Laterality: N/A;   BREAST LUMPECTOMY     BREAST LUMPECTOMY WITH RADIOACTIVE SEED LOCALIZATION Right 09/02/2020   Procedure: RIGHT BREAST LUMPECTOMY WITH RADIOACTIVE SEED LOCALIZATION;  Surgeon: Coralie Keens, MD;  Location: [facility];  Service: General;  Laterality: Right;  LMA, COORD CASE W/ [place]-NEEDS 2 HOURS  [facility] NEEDS 1 HOUR   BREAST REDUCTION SURGERY Bilateral 09/02/2020   Procedure: BILATERAL ONCOPLASTIC BREAST REDUCTION;  Surgeon: Wallace Going, DO;  Location: [facility];  Service: Plastics;  Laterality: Bilateral;   CARDIAC CATHETERIZATION  2010   Normal   CHOLECYSTECTOMY     STRESS ECHO TEST  05/31/2010   NO ISCHEMIA   TONSILLECTOMY     TRANSTHORACIC ECHOCARDIOGRAM  05/16/2010   EF 55-60%    OB History   No obstetric history on file.      Home Medications    Prior  to Admission medications   Medication Sig Start Date End Date Taking? Authorizing Provider  acetaminophen (TYLENOL) 650 MG CR tablet Take 650 mg by mouth at bedtime. 05/04/15  Yes [provider]  anastrozole (ARIMIDEX) 1 MG tablet Take 1 tablet (1 mg total) by mouth daily. 11/05/20  Yes Nicholas Lose, MD  azithromycin (ZITHROMAX Z-PAK) 250 MG tablet Take two pills today followed by one a day until gone 04/10/21  Yes Raylene Everts, MD  Calcium Carbonate (CALCIUM 600 PO) Take 1 tablet by mouth 3 (three) times a week. 06/03/19  Yes [provider]  calcium carbonate (TUMS - DOSED IN MG ELEMENTAL CALCIUM) 500 MG chewable tablet Chew 1 tablet by mouth as needed for indigestion or heartburn.    Yes [provider]  cholecalciferol (VITAMIN D) 1000 UNITS tablet Take 1,000 Units by mouth daily.   Yes [provider]  loratadine (CLARITIN) 10 MG tablet Take 10 mg by mouth daily as needed for allergies.   Yes [provider]  metoprolol succinate (TOPROL-XL) 25 MG 24 hr tablet TAKE 1&1/2 TABLET BY MOUTH EVERY DAY 03/03/21  Yes Deboraha Sprang, MD  multivitamin [facility]) per tablet Take 1 tablet by mouth every other day.   Yes [provider]  Polyethyl Glycol-Propyl Glycol (SYSTANE ULTRA) 0.4-0.3 % SOLN Place 1 drop into both eyes 3 (three) times daily as needed (dry eyes).   Yes [provider]  predniSONE (DELTASONE) 20 MG tablet Take 1 tablet (20 mg total) by mouth 2 (two) times daily with a meal. 04/10/21  Yes Raylene Everts, MD  rivaroxaban (XARELTO) 20 MG TABS tablet Take 20 mg by mouth daily with supper.   Yes [provider]  sodium chloride (OCEAN) 0.65 % SOLN nasal spray Place 1 spray into both nostrils as needed for congestion.   Yes [provider]  tobramycin (TOBREX) 0.3 % ophthalmic solution Place 1 drop into the right eye See admin instructions. For use on the day before, day of, and day after eye injection  07/14/20  Yes [provider]  traMADol-acetaminophen (ULTRACET) 37.5-325 MG tablet Take 1-2 tablets by mouth every 6 (six) hours as needed. 04/10/21  Yes Raylene Everts, MD  vitamin B-12 (CYANOCOBALAMIN) 500 MCG tablet Take 500 mcg by mouth every other day.   Yes [provider]    Family History Family History  Problem Relation Age of Onset   Breast cancer Mother 50   Breast cancer Sister 52   Cancer Maternal Grandmother        metastatic, unknown primary; dx 98   Breast cancer Cousin        d. 35; maternal cousin    Social History Social History   Tobacco Use   Smoking status: Never   Smokeless tobacco: Never  Vaping Use   Vaping Use: Never used  Substance Use Topics   Alcohol use: No   Drug  use: No     Allergies   Codeine, Sulfur dioxide, Sulfites, Codeine phosphate, Eliquis [apixaban], Other, and Sulfa antibiotics   Review of Systems Review of Systems See HPI  Physical Exam Triage Vital Signs ED Triage Vitals  Enc Vitals Group     BP 04/10/21 1149 113/79     Pulse Rate 04/10/21 1149 86     Resp 04/10/21 1149 16     Temp 04/10/21 1149 98.9 F (37.2 C)     Temp Source 04/10/21 1149 Oral     SpO2 04/10/21 1149 94 %     Weight --      Height --      Head Circumference --      Peak Flow --      Pain Score 04/10/21 1146 5     Pain Loc --      Pain Edu? --      Excl. in [place]? --    No data found.  Updated Vital Signs BP 113/79 (BP Location: Right Arm)   Pulse 86   Temp 98.9 F (37.2 C) (Oral)   Resp 16   SpO2 94%   Visual Acuity Right Eye Distance:   Left Eye Distance:   Bilateral Distance:    Right Eye Near:   Left Eye Near:    Bilateral Near:     Physical Exam Constitutional:      General: She is not in acute distress.    Appearance: Normal appearance. She is well-developed.  HENT:     Head: Normocephalic and atraumatic.     Right Ear: Tympanic membrane, ear canal and external ear normal.     Left Ear: Tympanic  membrane, ear canal and external ear normal.     Nose: Congestion present. No rhinorrhea.     Mouth/Throat:     Pharynx: Posterior oropharyngeal erythema present.  Eyes:     Conjunctiva/sclera: Conjunctivae normal.     Pupils: Pupils are equal, round, and reactive to light.  Cardiovascular:     Rate and Rhythm: Normal rate and regular rhythm.     Heart sounds: Normal heart sounds.  Pulmonary:     Effort: Pulmonary effort is normal. No respiratory distress.     Breath sounds: No wheezing or rales.  Abdominal:     General: There is no distension.     Palpations: Abdomen is soft.  Musculoskeletal:        General: Normal range of motion.     Cervical back: Normal range of motion.  Lymphadenopathy:     Cervical: No cervical adenopathy.  Skin:    General: Skin is warm and dry.  Neurological:     Mental Status: She is alert.     UC Treatments / Results  Labs (all labs ordered are listed, but only abnormal results are displayed) Labs Reviewed - No data to display  EKG   Radiology No results found.  Procedures Procedures (including critical care time)  Medications Ordered in UC Medications - No data to display  Initial Impression / Assessment and Plan / UC Course  I have reviewed the triage vital signs and the nursing notes.  Pertinent labs & imaging results that were available during my care of the patient were reviewed by me and considered in my medical decision making (see chart for details).     Discussed with patient that with only 2 days of symptoms, this is likely an upper respiratory virus.  I do not think she needs antibiotics at this  time.  I will prescribe for her something for her headache pain, Ultracet.  She is unable to take NSAID drugs due to her anticoagulant.  She is given a prescription for Zithromax, printed, to fill and use if she fails to improve in a week Final Clinical Impressions(s) / UC Diagnoses   Final diagnoses:  Viral URI with cough      Discharge Instructions      Take prednisone as directed Take 2 doses today May take Tylenol for mild pain.  Take ultrasound if pain is severe May use saline (salt water) nasal spray  Fill and take the antibiotic if you fail to improve over the next week   ED Prescriptions     Medication Sig Dispense Auth. Provider   predniSONE (DELTASONE) 20 MG tablet Take 1 tablet (20 mg total) by mouth 2 (two) times daily with a meal. 10 tablet Raylene Everts, MD   traMADol-acetaminophen (ULTRACET) 37.5-325 MG tablet Take 1-2 tablets by mouth every 6 (six) hours as needed. 20 tablet Raylene Everts, MD   azithromycin (ZITHROMAX Z-PAK) 250 MG tablet Take two pills today followed by one a day until gone 6 tablet Meda Coffee Jennette Banker, MD      I have reviewed the PDMP during this encounter.   Raylene Everts, MD 04/10/21 1325

## 2021-04-12 DIAGNOSIS — U071 COVID-19: Secondary | ICD-10-CM | POA: Diagnosis not present

## 2021-04-25 DIAGNOSIS — Z9889 Other specified postprocedural states: Secondary | ICD-10-CM | POA: Diagnosis not present

## 2021-04-25 DIAGNOSIS — L0291 Cutaneous abscess, unspecified: Secondary | ICD-10-CM | POA: Diagnosis not present

## 2021-05-13 DIAGNOSIS — Z170 Estrogen receptor positive status [ER+]: Secondary | ICD-10-CM | POA: Diagnosis not present

## 2021-05-13 DIAGNOSIS — C50011 Malignant neoplasm of nipple and areola, right female breast: Secondary | ICD-10-CM | POA: Diagnosis not present

## 2021-05-24 DIAGNOSIS — I4891 Unspecified atrial fibrillation: Secondary | ICD-10-CM | POA: Diagnosis not present

## 2021-05-24 DIAGNOSIS — M25552 Pain in left hip: Secondary | ICD-10-CM | POA: Diagnosis not present

## 2021-05-24 DIAGNOSIS — M25561 Pain in right knee: Secondary | ICD-10-CM | POA: Diagnosis not present

## 2021-05-24 DIAGNOSIS — R918 Other nonspecific abnormal finding of lung field: Secondary | ICD-10-CM | POA: Diagnosis not present

## 2021-05-24 DIAGNOSIS — Z170 Estrogen receptor positive status [ER+]: Secondary | ICD-10-CM | POA: Diagnosis not present

## 2021-05-24 DIAGNOSIS — Z0000 Encounter for general adult medical examination without abnormal findings: Secondary | ICD-10-CM | POA: Diagnosis not present

## 2021-05-24 DIAGNOSIS — C50011 Malignant neoplasm of nipple and areola, right female breast: Secondary | ICD-10-CM | POA: Diagnosis not present

## 2021-05-24 DIAGNOSIS — E785 Hyperlipidemia, unspecified: Secondary | ICD-10-CM | POA: Diagnosis not present

## 2021-05-26 ENCOUNTER — Other Ambulatory Visit: Payer: Self-pay | Admitting: Internal Medicine

## 2021-06-04 DIAGNOSIS — L03211 Cellulitis of face: Secondary | ICD-10-CM | POA: Diagnosis not present

## 2021-06-13 DIAGNOSIS — Z86010 Personal history of colonic polyps: Secondary | ICD-10-CM | POA: Diagnosis not present

## 2021-06-13 DIAGNOSIS — R1310 Dysphagia, unspecified: Secondary | ICD-10-CM | POA: Diagnosis not present

## 2021-06-13 DIAGNOSIS — I499 Cardiac arrhythmia, unspecified: Secondary | ICD-10-CM | POA: Diagnosis not present

## 2021-06-13 DIAGNOSIS — K219 Gastro-esophageal reflux disease without esophagitis: Secondary | ICD-10-CM | POA: Diagnosis not present

## 2021-06-15 DIAGNOSIS — M25511 Pain in right shoulder: Secondary | ICD-10-CM | POA: Diagnosis not present

## 2021-07-05 ENCOUNTER — Telehealth: Payer: Self-pay | Admitting: *Deleted

## 2021-07-05 NOTE — Telephone Encounter (Signed)
° °  Pre-operative Risk Assessment    Patient Name: Sheri Sims  DOB: 1951/03/28 MRN: [id number]      Request for Surgical Clearance    Procedure:   EGD / COLONOSCOPY    Date of Surgery:  Clearance 07/11/21                                 Surgeon:  DR. HUNG  Surgeon's Group or Practice Name:   [facility] Phone number:  [phone] Fax number:   [phone]   Type of Clearance Requested:   - Medical    Type of Anesthesia:   PROPOFOL    Additional requests/questions:      Astrid Divine   07/05/2021, 4:17 PM

## 2021-07-06 DIAGNOSIS — H353121 Nonexudative age-related macular degeneration, left eye, early dry stage: Secondary | ICD-10-CM | POA: Diagnosis not present

## 2021-07-06 DIAGNOSIS — H353211 Exudative age-related macular degeneration, right eye, with active choroidal neovascularization: Secondary | ICD-10-CM | POA: Diagnosis not present

## 2021-07-06 DIAGNOSIS — H35371 Puckering of macula, right eye: Secondary | ICD-10-CM | POA: Diagnosis not present

## 2021-07-06 DIAGNOSIS — H2511 Age-related nuclear cataract, right eye: Secondary | ICD-10-CM | POA: Diagnosis not present

## 2021-07-06 NOTE — Telephone Encounter (Signed)
° °  Name: Sheri Sims  DOB: 1951/05/12  MRN: [id number]   Primary Cardiologist: Virl Axe, MD  Chart reviewed as part of pre-operative protocol coverage. Patient was contacted 07/06/2021 in reference to pre-operative risk assessment for pending surgery as outlined below.  Sheri Sims was last seen on 03/14/2021 by Dr. Rayann Heman.  Since that day, Sheri Sims has done well without exertional chest pain or worsening dyspnea.  Based on the previous note by Dr. Rayann Heman in September 2022, patient was not taking anticoagulation therapy at that time for A. fib after the previous ablation procedure.  It appears, since then, patient has been diagnosed with a small PE based on CTA of the chest on 03/18/2021.  She was placed back on Xarelto and finished a 53-month course before her PCP instructed her to stop taking the medication.  She has not taken Xarelto for the past 6 days.  Therefore, based on ACC/AHA guidelines, the patient would be at acceptable risk for the planned procedure without further cardiovascular testing.   The patient was advised that if she develops new symptoms prior to surgery to contact our office to arrange for a follow-up visit, and she verbalized understanding.  I will route this recommendation to the requesting party via Epic fax function and remove from pre-op pool. Please call with questions.  [place], [place] 07/06/2021, 1:28 PM

## 2021-07-11 DIAGNOSIS — Z86010 Personal history of colonic polyps: Secondary | ICD-10-CM | POA: Diagnosis not present

## 2021-07-11 DIAGNOSIS — B3781 Candidal esophagitis: Secondary | ICD-10-CM | POA: Diagnosis not present

## 2021-07-11 DIAGNOSIS — R1310 Dysphagia, unspecified: Secondary | ICD-10-CM | POA: Diagnosis not present

## 2021-07-11 DIAGNOSIS — K222 Esophageal obstruction: Secondary | ICD-10-CM | POA: Diagnosis not present

## 2021-08-04 DIAGNOSIS — Z780 Asymptomatic menopausal state: Secondary | ICD-10-CM | POA: Diagnosis not present

## 2021-08-04 DIAGNOSIS — M85852 Other specified disorders of bone density and structure, left thigh: Secondary | ICD-10-CM | POA: Diagnosis not present

## 2021-08-04 DIAGNOSIS — M85851 Other specified disorders of bone density and structure, right thigh: Secondary | ICD-10-CM | POA: Diagnosis not present

## 2021-08-04 DIAGNOSIS — Z853 Personal history of malignant neoplasm of breast: Secondary | ICD-10-CM | POA: Diagnosis not present

## 2021-08-08 DIAGNOSIS — K219 Gastro-esophageal reflux disease without esophagitis: Secondary | ICD-10-CM | POA: Diagnosis not present

## 2021-08-08 DIAGNOSIS — R1310 Dysphagia, unspecified: Secondary | ICD-10-CM | POA: Diagnosis not present

## 2021-08-08 DIAGNOSIS — K222 Esophageal obstruction: Secondary | ICD-10-CM | POA: Diagnosis not present

## 2021-08-11 DIAGNOSIS — H00015 Hordeolum externum left lower eyelid: Secondary | ICD-10-CM | POA: Diagnosis not present

## 2021-08-22 ENCOUNTER — Other Ambulatory Visit: Payer: Self-pay | Admitting: Internal Medicine

## 2021-08-31 DIAGNOSIS — H353211 Exudative age-related macular degeneration, right eye, with active choroidal neovascularization: Secondary | ICD-10-CM | POA: Diagnosis not present

## 2021-08-31 DIAGNOSIS — H35371 Puckering of macula, right eye: Secondary | ICD-10-CM | POA: Diagnosis not present

## 2021-08-31 DIAGNOSIS — H353121 Nonexudative age-related macular degeneration, left eye, early dry stage: Secondary | ICD-10-CM | POA: Diagnosis not present

## 2021-08-31 DIAGNOSIS — H43812 Vitreous degeneration, left eye: Secondary | ICD-10-CM | POA: Diagnosis not present

## 2021-09-13 ENCOUNTER — Encounter (HOSPITAL_COMMUNITY): Payer: Self-pay | Admitting: Physician Assistant

## 2021-09-13 ENCOUNTER — Other Ambulatory Visit: Payer: Self-pay

## 2021-09-13 ENCOUNTER — Ambulatory Visit (HOSPITAL_COMMUNITY)
Admission: RE | Admit: 2021-09-13 | Discharge: 2021-09-13 | Disposition: A | Discharge status: Home / Self Care | Payer: Medicare PPO | Source: Ambulatory Visit | Attending: Physician Assistant | Admitting: Physician Assistant

## 2021-09-13 VITALS — BP 122/84 | HR 68 | Ht 65.0 in | Wt 168.0 lb

## 2021-09-13 DIAGNOSIS — Z961 Presence of intraocular lens: Secondary | ICD-10-CM | POA: Diagnosis not present

## 2021-09-13 DIAGNOSIS — I483 Typical atrial flutter: Secondary | ICD-10-CM | POA: Diagnosis not present

## 2021-09-13 DIAGNOSIS — H353211 Exudative age-related macular degeneration, right eye, with active choroidal neovascularization: Secondary | ICD-10-CM | POA: Diagnosis not present

## 2021-09-13 DIAGNOSIS — H524 Presbyopia: Secondary | ICD-10-CM | POA: Diagnosis not present

## 2021-09-13 DIAGNOSIS — I341 Nonrheumatic mitral (valve) prolapse: Secondary | ICD-10-CM | POA: Insufficient documentation

## 2021-09-13 DIAGNOSIS — I498 Other specified cardiac arrhythmias: Secondary | ICD-10-CM | POA: Diagnosis not present

## 2021-09-13 DIAGNOSIS — I480 Paroxysmal atrial fibrillation: Secondary | ICD-10-CM | POA: Diagnosis not present

## 2021-09-13 DIAGNOSIS — H25011 Cortical age-related cataract, right eye: Secondary | ICD-10-CM | POA: Diagnosis not present

## 2021-09-13 DIAGNOSIS — Z79899 Other long term (current) drug therapy: Secondary | ICD-10-CM | POA: Diagnosis not present

## 2021-09-13 NOTE — Progress Notes (Signed)
? ? ?Primary Care Physician: Aletha Halim., PA-C ?Primary Electrophysiologist: Dr Caryl Comes ?Referring Physician: Dr Rayann Heman ? ? ?Sheri Sims is a 71 y.o. female with a history of paroxysmal atrial fibrillation, atrial flutter, and MVP who presents for follow up in the [facility]. Patient unfortunately had more episodes of atrial fibrillation and atrial flutter despite the higher dose of flecainide. She underwent afib and flutter ablation with Dr Rayann Heman on 11/10/19. She did visit the ED on 11/10/19 with CP and an elevated troponin which were felt to be 2/2 her ablation.  ? ?On follow up today, patient reports that she has done well since her last visit. She did have some palpitations around Christmas 2022 but this resolved. She does note that lack of sleep and stress appear to be triggers for her afib.  ? ?Today, she denies symptoms of palpitations, chest pain, shortness of breath, orthopnea, PND, lower extremity edema, dizziness, presyncope, syncope, snoring, daytime somnolence, bleeding, or neurologic sequela. The patient is tolerating medications without difficulties and is otherwise without complaint today.  ? ? ?Atrial Fibrillation Risk Factors: ? ?she does not have symptoms or diagnosis of sleep apnea. ?she does not have a history of rheumatic fever. ?she does not have a history of alcohol use. ?The patient does not have a history of early familial atrial fibrillation or other arrhythmias. ? ?she has a BMI of Body mass index is 27.96 kg/m?Marland KitchenMarland Kitchen ?Filed Weights  ? 09/13/21 1011  ?Weight: 76.2 kg  ? ? ? ?Family History  ?Problem Relation Age of Onset  ? Breast cancer Mother 51  ? Breast cancer Sister 30  ? Cancer Maternal Grandmother   ?     metastatic, unknown primary; dx 72  ? Breast cancer Cousin   ?     d. 76; maternal cousin  ? ? ? ?Atrial Fibrillation Management history: ? ?Previous antiarrhythmic drugs: flecainide ?Previous cardioversions: none ?Previous ablations:  11/10/19 ?CHADS2VASC score: 2 (age, female) ?Anticoagulation history: Xarelto, Eliquis (switched back to Xarelto 2/2 side effects)  ? ? ?Past Medical History:  ?Diagnosis Date  ? Arthritis   ? Atrial flutter ([place])   ? Cancer [facility])   ? breast cancer  ? Costochondritis   ? Enthesopathy of hip region   ? Family history of breast cancer 07/21/2020  ? Fatigue   ? Fibromyalgia   ? Helicobacter pylori infection   ? Insomnia   ? MVP (mitral valve prolapse)   ? OF ANTERIOR MITRAL VALVE LEAFLET  ? Obesity   ? Paroxysmal atrial fibrillation ([facility])   ? Pharyngitis   ? Pneumonia   ? Sinusitis, chronic   ? ?Past Surgical History:  ?Procedure Laterality Date  ? ABDOMINAL HYSTERECTOMY    ? APPENDECTOMY    ? ATRIAL FIBRILLATION ABLATION N/A 11/06/2019  ? Procedure: ATRIAL FIBRILLATION ABLATION;  Surgeon: Thompson Grayer, MD;  Location: [place] [facility];  Service: Cardiovascular;  Laterality: N/A;  ? BREAST LUMPECTOMY    ? BREAST LUMPECTOMY WITH RADIOACTIVE SEED LOCALIZATION Right 09/02/2020  ? Procedure: RIGHT BREAST LUMPECTOMY WITH RADIOACTIVE SEED LOCALIZATION;  Surgeon: Coralie Keens, MD;  Location: [place];  Service: General;  Laterality: Right;  LMA, COORD CASE W/ [place]-NEEDS 2 HOURS ?[facility] NEEDS 1 HOUR  ? BREAST REDUCTION SURGERY Bilateral 09/02/2020  ? Procedure: BILATERAL ONCOPLASTIC BREAST REDUCTION;  Surgeon: Wallace Going, DO;  Location: [place] [facility];  Service: Plastics;  Laterality: Bilateral;  ? CARDIAC CATHETERIZATION  2010  ? Normal  ? CHOLECYSTECTOMY    ?  STRESS ECHO TEST  05/31/2010  ? NO ISCHEMIA  ? TONSILLECTOMY    ? TRANSTHORACIC ECHOCARDIOGRAM  05/16/2010  ? EF 55-60%  ? ? ?Current Outpatient Medications  ?Medication Sig Dispense Refill  ? acetaminophen (TYLENOL) 650 MG CR tablet Take 650 mg by mouth at bedtime.    ? calcium carbonate (TUMS - DOSED IN MG ELEMENTAL CALCIUM) 500 MG chewable tablet Chew 1 tablet by mouth as needed for indigestion or heartburn.     ? loratadine (CLARITIN) 10 MG tablet Take 10 mg by  mouth daily as needed for allergies.    ? metoprolol succinate (TOPROL-XL) 25 MG 24 hr tablet TAKE 1&1/2 TABLET BY MOUTH EVERY DAY 135 tablet 0  ? Polyethyl Glycol-Propyl Glycol (SYSTANE ULTRA) 0.4-0.3 % SOLN Place 1 drop into both eyes 3 (three) times daily as needed (dry eyes).    ? sodium chloride (OCEAN) 0.65 % SOLN nasal spray Place 1 spray into both nostrils as needed for congestion.    ? tobramycin (TOBREX) 0.3 % ophthalmic solution Place 1 drop into the right eye See admin instructions. For use on the day before, day of, and day after eye injection    ? ?No current facility-administered medications for this encounter.  ? ? ?Allergies  ?Allergen Reactions  ? Codeine Nausea And Vomiting and Nausea Only  ? Sulfur Dioxide Nausea Only  ? Sulfites Nausea And Vomiting  ? Codeine Phosphate Nausea And Vomiting  ? Eliquis [Apixaban] Rash  ? Other   ?  Prednisone cream caused blisters   ? Sulfa Antibiotics Nausea And Vomiting  ? ? ?Social History  ? ?Socioeconomic History  ? Marital status: Married  ?  Spouse name: Not on file  ? Number of children: 2  ? Years of education: Not on file  ? Highest education level: Not on file  ?Occupational History  ? Occupation: Retired Tourist information centre manager  ?Tobacco Use  ? Smoking status: Never  ? Smokeless tobacco: Never  ?Vaping Use  ? Vaping Use: Never used  ?Substance and Sexual Activity  ? Alcohol use: No  ? Drug use: No  ? Sexual activity: Not on file  ?Other Topics Concern  ? Not on file  ?Social History Narrative  ? Lives in [place] with spouse.  ? ?Social Determinants of Health  ? ?Financial Resource Strain: Not on file  ?Food Insecurity: Not on file  ?Transportation Needs: Not on file  ?Physical Activity: Not on file  ?Stress: Not on file  ?Social Connections: Not on file  ?Intimate Partner Violence: Not on file  ? ? ? ?ROS- All systems are reviewed and negative except as per the HPI above. ? ?Physical Exam: ?Vitals:  ? 09/13/21 1011  ?BP: 122/84  ?Pulse: 68  ?Weight: 76.2 kg  ?Height:  '5\' 5"'$  (1.651 m)  ? ? ?GEN- The patient is a well appearing elderly female, alert and oriented x 3 today.   ?HEENT-head normocephalic, atraumatic, sclera clear, conjunctiva pink, hearing intact, trachea midline. ?Lungs- Clear to ausculation bilaterally, normal work of breathing ?Heart- Regular rate and rhythm, no murmurs, rubs or gallops  ?GI- soft, NT, ND, + BS ?Extremities- no clubbing, cyanosis, or edema ?MS- no significant deformity or atrophy ?Skin- no rash or lesion ?Psych- euthymic mood, full affect ?Neuro- strength and sensation are intact ? ? ?Wt Readings from Last 3 Encounters:  ?09/13/21 76.2 kg  ?03/14/21 79 kg  ?11/11/20 80.5 kg  ? ? ?EKG today demonstrates  ?SR ?Vent. rate 68 BPM ?PR interval 124 ms ?QRS  duration 70 ms ?QT/QTcB 378/401 ms ? ?Echo 11/03/19 demonstrated  ?1. Left ventricular ejection fraction, by estimation, is 60 to 65%. The  ?left ventricle has normal function. The left ventricle has no regional  ?wall motion abnormalities. Left ventricular diastolic parameters are  ?consistent with Grade I diastolic dysfunction (impaired relaxation).  ? 2. Right ventricular systolic function is normal. The right ventricular  ?size is normal. There is normal pulmonary artery systolic pressure. The  ?estimated right ventricular systolic pressure is 80.3 mmHg.  ? 3. Left atrial size was mildly dilated.  ? 4. The mitral valve is normal in structure. Trivial mitral valve  ?regurgitation. No evidence of mitral stenosis.  ? 5. The aortic valve is tricuspid. Aortic valve regurgitation is not  ?visualized. No aortic stenosis is present.  ? 6. The inferior vena cava is normal in size with greater than 50%  ?respiratory variability, suggesting right atrial pressure of 3 mmHg.  ? ?Epic records are reviewed at length today ? ?Assessment and Plan: ? ?1. Paroxysmal atrial fibrillation/typical atrial flutter ?S/p afib and atrial flutter ablation 11/06/19 with Dr Rayann Heman. ?Patient appears to be maintaining SR.  ?Continue  Toprol 37.5 mg daily. ?Not currently on anticoagulation with CV score of 2.  ?Kardia mobile for home monitoring.  ? ?This patients CHA2DS2-VASc Score and unadjusted Ischemic Stroke Rate (% per year) is equal to 2.2 % st

## 2021-10-17 ENCOUNTER — Encounter (HOSPITAL_COMMUNITY): Payer: Self-pay

## 2021-10-18 ENCOUNTER — Encounter (HOSPITAL_COMMUNITY): Payer: Self-pay

## 2021-10-18 ENCOUNTER — Telehealth (HOSPITAL_COMMUNITY): Payer: Self-pay | Admitting: *Deleted

## 2021-10-18 MED ORDER — METOPROLOL SUCCINATE ER 50 MG PO TB24: 50.0000 mg | ORAL_TABLET | Freq: Every day | ORAL | 1 refills | Status: DC

## 2021-10-18 NOTE — Telephone Encounter (Signed)
Patient called in stating she is having increase PACs that are bothersome and she is having shortness of breath related to this. Kardia strips sent reviewed by Adline Peals PA also NSR with PACs no Afib noted.  Discussed with Audry Pili will try increasing metoprolol to '50mg'$  once a day to settle out PACs. Pt in agreement and rx sent. ?

## 2021-10-26 DIAGNOSIS — H35371 Puckering of macula, right eye: Secondary | ICD-10-CM | POA: Diagnosis not present

## 2021-10-26 DIAGNOSIS — H43812 Vitreous degeneration, left eye: Secondary | ICD-10-CM | POA: Diagnosis not present

## 2021-10-26 DIAGNOSIS — H353211 Exudative age-related macular degeneration, right eye, with active choroidal neovascularization: Secondary | ICD-10-CM | POA: Diagnosis not present

## 2021-10-26 DIAGNOSIS — H353121 Nonexudative age-related macular degeneration, left eye, early dry stage: Secondary | ICD-10-CM | POA: Diagnosis not present

## 2021-11-01 ENCOUNTER — Other Ambulatory Visit (HOSPITAL_COMMUNITY): Payer: Self-pay

## 2021-11-01 MED ORDER — METOPROLOL SUCCINATE ER 50 MG PO TB24: 50.0000 mg | ORAL_TABLET | Freq: Every day | ORAL | 1 refills | Status: DC

## 2021-11-02 ENCOUNTER — Ambulatory Visit: Payer: Medicare PPO | Admitting: Internal Medicine

## 2021-11-02 ENCOUNTER — Encounter: Payer: Self-pay | Admitting: Internal Medicine

## 2021-11-02 DIAGNOSIS — J310 Chronic rhinitis: Secondary | ICD-10-CM | POA: Insufficient documentation

## 2021-11-02 DIAGNOSIS — R918 Other nonspecific abnormal finding of lung field: Secondary | ICD-10-CM | POA: Diagnosis not present

## 2021-11-02 DIAGNOSIS — I2699 Other pulmonary embolism without acute cor pulmonale: Secondary | ICD-10-CM | POA: Diagnosis not present

## 2021-11-02 NOTE — Progress Notes (Addendum)
? ?Subjective:  ? ?Patient ID: Sheri Sims, female    DOB: May 03, 1951   MRN: [id number] ? ?Brief patient profile:  ?71 yowf never smoker bad pna age 71 but did fine thereafter except for seasonal allergies responding to otcs with abrupt R side pleuritic cp/ chills Nov 03 2013 > [place]  [facility] > dx RML pna admit x 2 days > complete resolution of R pain, chills and fever resolved but persistent fatigue and infiltrates RML gradually improving  But still present on  cxr 01/30/14 so referred to pulmonary clinic 02/13/2014 by Dr Kathryne Eriksson  ? ? ?History of Present Illness  ?02/13/2014 1st [facility] office visit/ Kiwanna Spraker  ?Chief Complaint  ?Patient presents with  ? Pulmonary Consult  ?  Referred per Dr. Kathryne Eriksson for eval of abn cxr. Pt states that she was dxed with PNA 4 months ago.  She c/o SOB with walking up and down stairs and if she bends over alot.   ?overall pattern was very abrupt onset of classic R lat pleuritic cp and much better by the time she was discharged and slow but steady improvement  in all symptoms since then . Never had hemoptysis or purulent sputum - more fatigue limiting activity than sob.  ?Dx RML syndrome ?rec ?No change rx ? ? ?  ?08/19/2018 acute extended ov/April Colter re: acute cough  ?Chief Complaint  ?Patient presents with  ? Acute Visit  ?  Pt c/o cough since 08/09/2018. Cough is currently prod with green to yellow sputum. She also c/o runny nose and sneezin.  ?husband was sick then 2/7 pt noted acute onset sore throat cough burning/ raw sensation over ant chest exp when out in cold  ?Only sob with cough ?Cough worse in evenings and with exp to  Cold air/   But sleeps ok / worse first thing in am ?rec ?Augmentin 875 mg take one pill twice daily  X 10 days   ?If not better >   Prednisone 10 mg take  4 each am x 2 days,   2 each am x 2 days,  1 each am x 2 days and stop  ?GERD diet ?For cough > hycodan up to a tsp every 4 hours as needed  ? ? ?11/11/2020  f/u ov/Aithana Kushner re:  F/u nodules ?Chief  Complaint  ?Patient presents with  ? Follow-up  ?  CXR done today. Breathing is overall doing well.   ? Dyspnea:  Not limited by breathing from desired activities   ?Cough: none  ?Sleeping: flat bed / one pillow no resp cc ?SABA use: none  ?02: none  ?Covid status:  vax x 2  infected 09/08/19 ?Rec ?No change rx  ? ? ? ?11/02/2021  f/u ov/Daryle Boyington re: f/u nodules  and PE 03/2021 now now off DOAC  ?Chief Complaint  ?Patient presents with  ? Follow-up  ?  Dx with PE 03/18/21- tx with Xarelto x 4 months. Breathing is overall doing well today.   ? Dyspnea:  Not limited by breathing from desired activities   ?Cough: none unless assoc with sniffles better with claritin/ rarely needs hycodan ?Sleeping: sleeps fine p zyrtec ?SABA use: none  ?02: none  ?  ? ? ?No obvious day to day or daytime variability or assoc excess/ purulent sputum or mucus plugs or hemoptysis or cp or chest tightness, subjective wheeze or overt  hb symptoms.  ? ?Sleeping as above without nocturnal  or early am exacerbation  of respiratory  c/o's  or need for noct saba. Also denies any obvious fluctuation of symptoms with weather or environmental changes or other aggravating or alleviating factors except as outlined above  ? ?No unusual exposure hx or h/o childhood pna/ asthma or knowledge of premature birth. ? ?Current Allergies, Complete Past Medical History, Past Surgical History, Family History, and Social History were reviewed in [employer] record. ? ?ROS  The following are not active complaints unless bolded ?Hoarseness, sore throat, dysphagia, dental problems, itching, sneezing,  nasal congestion with sniffles or discharge of excess mucus or purulent secretions, ear ache,   fever, chills, sweats, unintended wt loss or wt gain, classically pleuritic or exertional cp,  orthopnea pnd or arm/hand swelling  or leg swelling, presyncope, palpitations, abdominal pain, anorexia, nausea, vomiting, diarrhea  or change in bowel habits or change  in bladder habits, change in stools or change in urine, dysuria, hematuria,  rash, arthralgias, visual complaints, headache, numbness, weakness or ataxia or problems with walking or coordination,  change in mood or  memory. ?      ? ?Current Meds  ?Medication Sig  ? acetaminophen (TYLENOL) 650 MG CR tablet Take 650 mg by mouth at bedtime.  ? B Complex Vitamins (B COMPLEX 1 PO) Take 1 capsule by mouth daily.  ? calcium carbonate (TUMS - DOSED IN MG ELEMENTAL CALCIUM) 500 MG chewable tablet Chew 1 tablet by mouth as needed for indigestion or heartburn.   ? Cholecalciferol (D3 PO) Take 2,000 Units by mouth daily.  ? loratadine (CLARITIN) 10 MG tablet Take 10 mg by mouth daily as needed for allergies.  ? MAGNESIUM GLYCINATE PO Take 1 tablet by mouth daily.  ? Menaquinone-7 (VITAMIN K2 PO) Take 1 tablet by mouth daily.  ? metoprolol succinate (TOPROL-XL) 50 MG 24 hr tablet Take 1 tablet (50 mg total) by mouth daily.  ? Multiple Vitamin (MULTIVITAMIN) capsule Take 1 capsule by mouth every other day.  ? Multiple Vitamins-Minerals (ZINC PO) Take 1 capsule by mouth daily.  ? Polyethyl Glycol-Propyl Glycol (SYSTANE ULTRA) 0.4-0.3 % SOLN Place 1 drop into both eyes 3 (three) times daily as needed (dry eyes).  ? sodium chloride (OCEAN) 0.65 % SOLN nasal spray Place 1 spray into both nostrils as needed for congestion.  ? tobramycin (TOBREX) 0.3 % ophthalmic solution Place 1 drop into the right eye See admin instructions. For use on the day before, day of, and day after eye injection  ?     ? ?  ? ?  ? ? ?  ? ? ?   ?Objective:  ? Physical Exam ? ? Wts ? ?11/02/2021         171  ?11/11/2020       177 ?08/19/2018       183 ?05/01/2018     183  ?09/03/2017         179  ?11/15/2016       179  ?10/12/2016       176  ?03/24/14           188  ? ?Vital signs reviewed  11/02/2021  - Note at rest 02 sats  98% on RA  ? ?General appearance:    amb wf nad   ? ? HEENT : nl exam   ? ? ?NECK :  without JVD/Nodes/TM/ nl carotid upstrokes  bilaterally ? ? ?LUNGS: no acc muscle use,  Nl contour chest which is clear to A and P bilaterally without cough on insp or exp maneuvers ? ? ?  CV:  RRR  no s3 or murmur or increase in P2, and no edema  ? ?ABD:  soft and nontender with nl inspiratory excursion in the supine position. No bruits or organomegaly appreciated, bowel sounds nl ? ?MS:  Nl gait/ ext warm without deformities, calf tenderness, cyanosis or clubbing ?No obvious joint restrictions  ? ?SKIN: warm and dry without lesions   ? ?NEURO:  alert, approp, nl sensorium with  no motor or cerebellar deficits apparent.  ?  ?   ? ? ?I personally reviewed images and agree with radiology impression as follows:  ? Chest CTa  03/18/21 ?1. Small volume nonocclusive subacute pulmonary emboli within a ?segmental pulmonary artery in the right lower lobe. No evidence of ?acute/occlusive PE. ?2. Coronary artery calcifications along the left anterior descending ?coronary artery. ?3. Stable pulmonary granuloma and adjacent pleuroparenchymal ?scarring. ?4. Left parapelvic renal sinus cysts. ? ? ?Assessment & Plan:  ? ?

## 2021-11-02 NOTE — Assessment & Plan Note (Addendum)
Acute onset symptoms May 2015 ? RML syndrome  ?  - ESR 02/13/2014 = 20 ?- CT chest  03/31/14 Largely wedge shaped peripheral airspace opacity in the right middle lobe most likely post pneumonic atelectasis.   ?- CT 07/07/2014 >  01/18/2015 no change  ?- CT 03/15/2016 1. Essentially stable appearance of an irregular shaped mass-like ?area in the lateral segment of the right middle lobe, which now ?demonstrates 2 years of stability. This is strongly favored to ?represent a benign area, likely chronic mucoid impaction within ?dilated distal bronchi with surrounding scarring and atelectasis.> no f/u needed unless new symptoms ?- CTa 10/06/16 ?1.??No pulmonary embolism. ?2.??Focal consolidation in the right upper lobe most likely pneumonia. ?3.??Slightly decreased size of a masslike opacity in the right middle lobe compared to CT from 2015. This is favored to represent sequela of prior infection.  ?- HRCT  10/27/2016  ?Lesion is well demonstrated on coronal and sagittal ?reconstructions and when comparing to 03/31/2014, the lesion ?measures 1.2 x 1.8 x 2.4 cm today compared to 1.5 x 2.2 x 2.6 cm on ?03/31/2014.  RUL process has completely resolved c/w CAP  ?- recurrent R chest wall pain 05/01/2018 > rx empirically with 10  Days augmentin ?- CT chest 03/15/20  ?1. Clustered solid pulmonary nodules in a branching configuration in ?the posterior right lower lobe, new in the short interval since ?11/03/2019 CT, probably infectious/inflammatory. Follow-up chest CT ?recommended in 3-6 months. ?2. Previously described clustered solid nodules in the medial left ?lower lobe are decreased since 11/03/2019 CT, compatible with ?resolving benign inflammatory nodularity. ?- cxr 11/11/2020 no change  ? ? ?>>>  CTa 03/18/21 no change/ low risk as never smoked so no dedicated  f/u needed  ? ? ? ?

## 2021-11-02 NOTE — Assessment & Plan Note (Addendum)
rec clariton as 1st line, zyrtec hs as second, and if for hs use prn  1st gen H1 blockers per guidelines  With allergy f/u prn  ? ? ? ?Each maintenance medication was reviewed in detail including emphasizing most importantly the difference between maintenance and prns and under what circumstances the prns are to be triggered using an action plan format where appropriate. ? ?Total time for H and P, chart review, counseling ) and generating customized AVS unique to this office visit / same day charting > 30 min summary final f/u ov  ?     ? ? ? ? ? ? ? ?

## 2021-11-02 NOTE — Patient Instructions (Addendum)
Clariton is first choice, zyrtec 2nd and 3rd choice For drainage / throat tickle try take CHLORPHENIRAMINE  4 mg  ("Allergy Relief" '4mg'$   at [facility] should be easiest to find in the blue box usually on bottom shelf)  take one every 4 hours as needed - extremely effective and inexpensive over the counter- may cause drowsiness so start with just a dose or two an hour before bedtime and see how you tolerate it before trying in daytime.  ? ?We will call to schedule you a venous doppler  ? ?Pulmonary follow up is as needed  ? ? ? ?

## 2021-11-02 NOTE — Assessment & Plan Note (Addendum)
CTa pos non occulisve PE  03/18/21 rx DOAC c 4 m ?-  Venous dopplers  11/02/2021 >>>  ?

## 2021-11-09 ENCOUNTER — Ambulatory Visit: Payer: Medicare PPO | Admitting: Internal Medicine

## 2021-11-10 ENCOUNTER — Encounter (HOSPITAL_COMMUNITY): Payer: Self-pay

## 2021-11-14 ENCOUNTER — Ambulatory Visit (HOSPITAL_COMMUNITY)
Admission: RE | Admit: 2021-11-14 | Discharge: 2021-11-14 | Disposition: A | Discharge status: Home / Self Care | Payer: Medicare PPO | Source: Ambulatory Visit | Attending: Internal Medicine | Admitting: Internal Medicine

## 2021-11-14 DIAGNOSIS — I2699 Other pulmonary embolism without acute cor pulmonale: Secondary | ICD-10-CM | POA: Diagnosis not present

## 2021-11-16 ENCOUNTER — Other Ambulatory Visit: Payer: Self-pay

## 2021-11-17 ENCOUNTER — Other Ambulatory Visit: Payer: Self-pay

## 2021-11-17 ENCOUNTER — Telehealth: Payer: Self-pay

## 2021-11-17 DIAGNOSIS — I2699 Other pulmonary embolism without acute cor pulmonale: Secondary | ICD-10-CM

## 2021-11-17 NOTE — Telephone Encounter (Signed)
error 

## 2021-11-17 NOTE — Progress Notes (Signed)
Called the pt and there was no answer- LMTCB    

## 2021-11-23 ENCOUNTER — Ambulatory Visit (HOSPITAL_COMMUNITY)
Admission: RE | Admit: 2021-11-23 | Discharge: 2021-11-23 | Disposition: A | Discharge status: Home / Self Care | Payer: Medicare PPO | Source: Ambulatory Visit | Attending: Internal Medicine | Admitting: Internal Medicine

## 2021-11-23 DIAGNOSIS — I4891 Unspecified atrial fibrillation: Secondary | ICD-10-CM | POA: Insufficient documentation

## 2021-11-23 DIAGNOSIS — R0602 Shortness of breath: Secondary | ICD-10-CM | POA: Diagnosis not present

## 2021-11-23 DIAGNOSIS — I4892 Unspecified atrial flutter: Secondary | ICD-10-CM | POA: Insufficient documentation

## 2021-11-23 DIAGNOSIS — I341 Nonrheumatic mitral (valve) prolapse: Secondary | ICD-10-CM | POA: Diagnosis not present

## 2021-11-23 DIAGNOSIS — I2699 Other pulmonary embolism without acute cor pulmonale: Secondary | ICD-10-CM | POA: Diagnosis not present

## 2021-11-23 LAB — ECHOCARDIOGRAM COMPLETE
Area-P 1/2: 3.91 cm2
Calc EF: 60.9 %
S' Lateral: 2.8 cm
Single Plane A2C EF: 57.4 %
Single Plane A4C EF: 65.5 %

## 2021-11-23 NOTE — Progress Notes (Signed)
  Echocardiogram 2D Echocardiogram has been performed.  Sheri Sims 11/23/2021, 10:04 AM

## 2021-11-25 ENCOUNTER — Telehealth: Payer: Self-pay | Admitting: Internal Medicine

## 2021-11-25 DIAGNOSIS — C50911 Malignant neoplasm of unspecified site of right female breast: Secondary | ICD-10-CM | POA: Diagnosis not present

## 2021-11-25 NOTE — Progress Notes (Signed)
Spoke with pt and notified of results per Dr. Wert. Pt verbalized understanding and denied any questions. 

## 2021-11-25 NOTE — Progress Notes (Signed)
Called pt and there was no answer-LMTCB °

## 2021-11-25 NOTE — Telephone Encounter (Signed)
Called and spoke to patient.  She states she had a vm saying she had additional information after result note.  Went over echo results with her again and she voiced understanding. Nothing further needed at this time will route results to Dr. Deatra Ina (PCP)

## 2021-12-06 ENCOUNTER — Telehealth: Payer: Self-pay | Admitting: Internal Medicine

## 2021-12-06 NOTE — Telephone Encounter (Signed)
Spoke with the pt regarding following doppler results from Dr Melvyn Novas-    Melvyn Novas, Sheri Deem, MD  Sheri Sims, Sheri Sims patient :  Study does not show clots but is worrisome for the effects of clots in her her lungs affecting her Right sided heart pressures so rec   1) restart xarelto at 20 mg daily (no loading dose)  2) Echo to check out R side pressures and do v/q next if elevated   Copy to PCP    She states that someone else from here already called and and told her that the dopplers were not correct and that her right side pressures "are all fine" and no need for Xarelto.   I do see ECHO results but this does not mention Xarelto.  Please advise thanks

## 2021-12-06 NOTE — Progress Notes (Signed)
See phone note dated 12/05/21.

## 2021-12-06 NOTE — Telephone Encounter (Signed)
Discussed with pt  Agree does not need xarelto at this point and the tiny non-occlusive PE on the prior CTa does not need f/u   Advised to be on the lookout for asym leg swelling or calf pain and stay as active as possible.

## 2021-12-16 DIAGNOSIS — B029 Zoster without complications: Secondary | ICD-10-CM | POA: Diagnosis not present

## 2021-12-26 DIAGNOSIS — B029 Zoster without complications: Secondary | ICD-10-CM | POA: Diagnosis not present

## 2022-01-11 DIAGNOSIS — H35371 Puckering of macula, right eye: Secondary | ICD-10-CM | POA: Diagnosis not present

## 2022-01-11 DIAGNOSIS — H353211 Exudative age-related macular degeneration, right eye, with active choroidal neovascularization: Secondary | ICD-10-CM | POA: Diagnosis not present

## 2022-01-11 DIAGNOSIS — H353121 Nonexudative age-related macular degeneration, left eye, early dry stage: Secondary | ICD-10-CM | POA: Diagnosis not present

## 2022-01-11 DIAGNOSIS — H43812 Vitreous degeneration, left eye: Secondary | ICD-10-CM | POA: Diagnosis not present

## 2022-01-31 DIAGNOSIS — R42 Dizziness and giddiness: Secondary | ICD-10-CM | POA: Diagnosis not present

## 2022-03-14 DIAGNOSIS — B0229 Other postherpetic nervous system involvement: Secondary | ICD-10-CM | POA: Diagnosis not present

## 2022-03-14 DIAGNOSIS — R7989 Other specified abnormal findings of blood chemistry: Secondary | ICD-10-CM | POA: Diagnosis not present

## 2022-03-14 DIAGNOSIS — I4891 Unspecified atrial fibrillation: Secondary | ICD-10-CM | POA: Diagnosis not present

## 2022-03-17 ENCOUNTER — Telehealth: Payer: Self-pay | Admitting: *Deleted

## 2022-03-17 NOTE — Patient Outreach (Signed)
  Care Coordination   03/17/2022 Name: BLINDA TUREK MRN: [id number] DOB: 1950-11-10   Care Coordination Outreach Attempts:  An unsuccessful telephone outreach was attempted today to offer the patient information about available care coordination services as a benefit of their health plan.   Follow Up Plan:  Additional outreach attempts will be made to offer the patient care coordination information and services.   Encounter Outcome:  No Answer  Care Coordination Interventions Activated:  No   Care Coordination Interventions:  No, not indicated    SIG Punam Broussard C. Myrtie Neither, MSN, [facility] Gerontological Nurse Practitioner [facility] Care Management [phone]

## 2022-03-22 ENCOUNTER — Telehealth: Payer: Self-pay | Admitting: *Deleted

## 2022-03-22 NOTE — Patient Outreach (Signed)
  Care Coordination   03/22/2022 Name: Sheri Sims MRN: [id number] DOB: Aug 14, 1950   Care Coordination Outreach Attempts:  A second unsuccessful outreach was attempted today to offer the patient with information about available care coordination services as a benefit of their health plan.     Follow Up Plan:  Additional outreach attempts will be made to offer the patient care coordination information and services.   Encounter Outcome:  No Answer  Care Coordination Interventions Activated:  No   Care Coordination Interventions:  No, not indicated    Jacqlyn Larsen [facility], [place] RN Care Coordinator [phone]

## 2022-03-29 ENCOUNTER — Telehealth: Payer: Self-pay | Admitting: *Deleted

## 2022-03-29 ENCOUNTER — Encounter: Payer: Self-pay | Admitting: *Deleted

## 2022-03-29 DIAGNOSIS — H43812 Vitreous degeneration, left eye: Secondary | ICD-10-CM | POA: Diagnosis not present

## 2022-03-29 DIAGNOSIS — H353211 Exudative age-related macular degeneration, right eye, with active choroidal neovascularization: Secondary | ICD-10-CM | POA: Diagnosis not present

## 2022-03-29 DIAGNOSIS — H35371 Puckering of macula, right eye: Secondary | ICD-10-CM | POA: Diagnosis not present

## 2022-03-29 DIAGNOSIS — H353121 Nonexudative age-related macular degeneration, left eye, early dry stage: Secondary | ICD-10-CM | POA: Diagnosis not present

## 2022-03-29 NOTE — Patient Outreach (Signed)
  Care Coordination   Initial Visit Note   03/29/2022 Name: Sheri Sims MRN: [id number] DOB: [birth year]  Sheri Sims is a 71 y.o. year old female who sees Aletha Halim., PA-C for primary care. I spoke with  [name] by phone today.  What matters to the patients health and wellness today?  "to maintain"    Goals Addressed               This Visit's Progress     COMPLETED: "to maintain" (pt-stated)        Care Coordination Interventions: Patient interviewed about adult health maintenance status including  importance of Annual Wellness Visit, pt reports she gets Annual Wellness Visit Provided education about importance of taking medication as prescribed, pt states she is taking one prescribed medication- metoprolol Explained care coordination program, pt appreciative of and agreeable to today's outreach but declines any further outreach citing no needs          SDOH assessments and interventions completed:  Yes  SDOH Interventions Today    Flowsheet Row Most Recent Value  SDOH Interventions   Food Insecurity Interventions Intervention Not Indicated  Housing Interventions Intervention Not Indicated  Transportation Interventions Intervention Not Indicated        Care Coordination Interventions Activated:  Yes  Care Coordination Interventions:  Yes, provided   Follow up plan: No further intervention required.   Encounter Outcome:  Pt. Visit Completed   Jacqlyn Larsen [facility], BSN [facility] RN Care Coordinator [phone]

## 2022-06-05 DIAGNOSIS — C50011 Malignant neoplasm of nipple and areola, right female breast: Secondary | ICD-10-CM | POA: Diagnosis not present

## 2022-06-05 DIAGNOSIS — E785 Hyperlipidemia, unspecified: Secondary | ICD-10-CM | POA: Diagnosis not present

## 2022-06-05 DIAGNOSIS — Z0000 Encounter for general adult medical examination without abnormal findings: Secondary | ICD-10-CM | POA: Diagnosis not present

## 2022-06-05 DIAGNOSIS — I480 Paroxysmal atrial fibrillation: Secondary | ICD-10-CM | POA: Diagnosis not present

## 2022-06-05 DIAGNOSIS — R778 Other specified abnormalities of plasma proteins: Secondary | ICD-10-CM | POA: Diagnosis not present

## 2022-06-09 DIAGNOSIS — J019 Acute sinusitis, unspecified: Secondary | ICD-10-CM | POA: Diagnosis not present

## 2022-07-07 ENCOUNTER — Other Ambulatory Visit (HOSPITAL_COMMUNITY): Payer: Self-pay | Admitting: *Deleted

## 2022-07-07 ENCOUNTER — Telehealth: Payer: Self-pay | Admitting: Internal Medicine

## 2022-07-07 MED ORDER — METOPROLOL SUCCINATE ER 50 MG PO TB24: 50.0000 mg | ORAL_TABLET | Freq: Every day | ORAL | 1 refills | Status: DC

## 2022-07-07 NOTE — Telephone Encounter (Signed)
Metoprolol Succinate '50mg'$  has been sent to pharmacy by [facility] clinic staff.

## 2022-07-07 NOTE — Telephone Encounter (Signed)
*  STAT* If patient is at the pharmacy, call can be transferred to refill team.   1. Which medications need to be refilled? (please list name of each medication and dose if known) metoprolol succinate (TOPROL-XL) 50 MG 24 hr tablet   2. Which pharmacy/location (including street and city if local pharmacy) is medication to be sent to? [id number]- [place], [place] - [address]    3. Do they need a 30 day or 90 day supply? [place]

## 2022-07-07 NOTE — Telephone Encounter (Signed)
Patient would like a referral to see Dr. Stanford Breed as a general cardiologist

## 2022-07-11 DIAGNOSIS — R768 Other specified abnormal immunological findings in serum: Secondary | ICD-10-CM | POA: Diagnosis not present

## 2022-07-12 DIAGNOSIS — H353211 Exudative age-related macular degeneration, right eye, with active choroidal neovascularization: Secondary | ICD-10-CM | POA: Diagnosis not present

## 2022-07-12 DIAGNOSIS — H43812 Vitreous degeneration, left eye: Secondary | ICD-10-CM | POA: Diagnosis not present

## 2022-07-12 DIAGNOSIS — H35371 Puckering of macula, right eye: Secondary | ICD-10-CM | POA: Diagnosis not present

## 2022-07-12 DIAGNOSIS — H353121 Nonexudative age-related macular degeneration, left eye, early dry stage: Secondary | ICD-10-CM | POA: Diagnosis not present

## 2022-08-07 DIAGNOSIS — R928 Other abnormal and inconclusive findings on diagnostic imaging of breast: Secondary | ICD-10-CM | POA: Diagnosis not present

## 2022-08-07 DIAGNOSIS — Z853 Personal history of malignant neoplasm of breast: Secondary | ICD-10-CM | POA: Diagnosis not present

## 2022-08-10 ENCOUNTER — Encounter (HOSPITAL_COMMUNITY): Payer: Self-pay | Admitting: *Deleted

## 2022-08-24 NOTE — Progress Notes (Signed)
HPI: Follow-up paroxysmal atrial fibrillation, atrial flutter and mitral valve prolapse.  Previously followed by Dr. Rayann Heman but transitioning to me.  Nuclear study January 2019 showed ejection fraction 70% and normal perfusion.  CTA prior to atrial fibrillation ablation May 2021 showed calcium score 13.3 which is 44th percentile; there was note of pulmonary nodules and follow-up recommended in 3 to 6 months.  Had atrial fibrillation ablation May 2021.  CTA September 2022 showed small pulmonary embolus, coronary calcification.  Echocardiogram May 2023 showed normal LV function, mild left ventricular hypertrophy, mild right atrial enlargement.  Since last seen she denies dyspnea, chest pain, palpitations or syncope.  Current Outpatient Medications  Medication Sig Dispense Refill   acetaminophen (TYLENOL) 650 MG CR tablet Take 650 mg by mouth at bedtime.     B Complex Vitamins (B COMPLEX 1 PO) Take 1 capsule by mouth daily.     calcium carbonate (TUMS - DOSED IN MG ELEMENTAL CALCIUM) 500 MG chewable tablet Chew 1 tablet by mouth as needed for indigestion or heartburn.      Cholecalciferol (D3 PO) Take 2,000 Units by mouth daily.     loratadine (CLARITIN) 10 MG tablet Take 10 mg by mouth daily as needed for allergies.     MAGNESIUM GLYCINATE PO Take 1 tablet by mouth daily.     Menaquinone-7 (VITAMIN K2 PO) Take 1 tablet by mouth daily.     metoprolol succinate (TOPROL-XL) 50 MG 24 hr tablet Take 1 tablet (50 mg total) by mouth daily. 90 tablet 1   Multiple Vitamin (MULTIVITAMIN) capsule Take 1 capsule by mouth every other day.     Multiple Vitamins-Minerals (ZINC PO) Take 1 capsule by mouth daily.     Polyethyl Glycol-Propyl Glycol (SYSTANE ULTRA) 0.4-0.3 % SOLN Place 1 drop into both eyes 3 (three) times daily as needed (dry eyes).     sodium chloride (OCEAN) 0.65 % SOLN nasal spray Place 1 spray into both nostrils as needed for congestion.     tobramycin (TOBREX) 0.3 % ophthalmic solution  Place 1 drop into the right eye See admin instructions. For use on the day before, day of, and day after eye injection     No current facility-administered medications for this visit.     Past Medical History:  Diagnosis Date   Arthritis    Atrial flutter ([place])    Cancer ([place])    breast cancer   Costochondritis    Enthesopathy of hip region    Family history of breast cancer 07/21/2020   Fatigue    Fibromyalgia    Helicobacter pylori infection    Insomnia    MVP (mitral valve prolapse)    OF ANTERIOR MITRAL VALVE LEAFLET   Obesity    Paroxysmal atrial fibrillation ([facility])    Pharyngitis    Pneumonia    Sinusitis, chronic     Past Surgical History:  Procedure Laterality Date   ABDOMINAL HYSTERECTOMY     APPENDECTOMY     ATRIAL FIBRILLATION ABLATION N/A 11/06/2019   Procedure: ATRIAL FIBRILLATION ABLATION;  Surgeon: Thompson Grayer, MD;  Location: [facility];  Service: Cardiovascular;  Laterality: N/A;   BREAST LUMPECTOMY     BREAST LUMPECTOMY WITH RADIOACTIVE SEED LOCALIZATION Right 09/02/2020   Procedure: RIGHT BREAST LUMPECTOMY WITH RADIOACTIVE SEED LOCALIZATION;  Surgeon: Coralie Keens, MD;  Location: [facility];  Service: General;  Laterality: Right;  LMA, COORD CASE W/ [place]-NEEDS 2 HOURS [place] NEEDS 1 HOUR   BREAST REDUCTION SURGERY Bilateral  09/02/2020   Procedure: BILATERAL ONCOPLASTIC BREAST REDUCTION;  Surgeon: Wallace Going, DO;  Location: [facility];  Service: Plastics;  Laterality: Bilateral;   CARDIAC CATHETERIZATION  2010   Normal   CHOLECYSTECTOMY     STRESS ECHO TEST  05/31/2010   NO ISCHEMIA   TONSILLECTOMY     TRANSTHORACIC ECHOCARDIOGRAM  05/16/2010   EF 55-60%    Social History   Socioeconomic History   Marital status: Married    Spouse name: Not on file   Number of children: 2   Years of education: Not on file   Highest education level: Not on file  Occupational History   Occupation: Retired Tourist information centre manager  Tobacco Use   Smoking  status: Never   Smokeless tobacco: Never  Vaping Use   Vaping Use: Never used  Substance and Sexual Activity   Alcohol use: No   Drug use: No   Sexual activity: Not on file  Other Topics Concern   Not on file  Social History Narrative   Lives in [place] with spouse.   Social Determinants of Health   Financial Resource Strain: Not on file  Food Insecurity: No Food Insecurity (03/29/2022)   Hunger Vital Sign    Worried About Running Out of Food in the Last Year: Never true    Ran Out of Food in the Last Year: Never true  Transportation Needs: No Transportation Needs (03/29/2022)   PRAPARE - Hydrologist (Medical): No    Lack of Transportation (Non-Medical): No  Physical Activity: Not on file  Stress: Not on file  Social Connections: Not on file  Intimate Partner Violence: Not on file    Family History  Problem Relation Age of Onset   Breast cancer Mother 70   Breast cancer Sister 25   Cancer Maternal Grandmother        metastatic, unknown primary; dx 57   Breast cancer Cousin        d. 75; maternal cousin    ROS: no fevers or chills, productive cough, hemoptysis, dysphasia, odynophagia, melena, hematochezia, dysuria, hematuria, rash, seizure activity, orthopnea, PND, pedal edema, claudication. Remaining systems are negative.  Physical Exam: Well-developed well-nourished in no acute distress.  Skin is warm and dry.  HEENT is normal.  Neck is supple.  Chest is clear to auscultation with normal expansion.  Cardiovascular exam is regular rate and rhythm.  Abdominal exam nontender or distended. No masses palpated. Extremities show no edema. neuro grossly intact  ECG-normal sinus rhythm at a rate of 69, no ST changes.  Personally reviewed  A/P  1 paroxysmal atrial fibrillation/flutter-status post ablation.  She remains in sinus rhythm.  Continue Toprol at present dose.  Anticoagulation was discontinued previously.  2 coronary  calcification-she denies chest pain.  She declined statins.  3 history of mitral valve prolapse-not evident on most recent echocardiogram.  Kirk Ruths, MD

## 2022-09-07 ENCOUNTER — Ambulatory Visit: Payer: Medicare PPO | Attending: Cardiology | Admitting: Cardiology

## 2022-09-07 ENCOUNTER — Encounter: Payer: Self-pay | Admitting: Cardiology

## 2022-09-07 VITALS — BP 120/68 | HR 69 | Ht 65.0 in | Wt 172.0 lb

## 2022-09-07 DIAGNOSIS — I480 Paroxysmal atrial fibrillation: Secondary | ICD-10-CM

## 2022-09-07 NOTE — Patient Instructions (Signed)
  Follow-Up: At [facility], you and your health needs are our priority.  As part of our continuing mission to provide you with exceptional heart care, we have created designated Provider Care Teams.  These Care Teams include your primary Cardiologist (physician) and Advanced Practice Providers (APPs -  Physician Assistants and Nurse Practitioners) who all work together to provide you with the care you need, when you need it.  We recommend signing up for the patient portal called "MyChart".  Sign up information is provided on this After Visit Summary.  MyChart is used to connect with patients for Virtual Visits (Telemedicine).  Patients are able to view lab/test results, encounter notes, upcoming appointments, etc.  Non-urgent messages can be sent to your provider as well.   To learn more about what you can do with MyChart, go to [email or web address].    Your next appointment:   12 month(s)  Provider:   Brian Crenshaw MD    

## 2022-10-25 IMAGING — MR MR BREAST BILAT WO/W CM
7 of 10 series · 28 of 48 positions shown · IV contrast (gadavist)
Comparison: May 25, 2008 MRI.  Multiple recent mammograms.

CLINICAL DATA: The patient had 3 recent stereotactic biopsies
demonstrating 2 sites of invasive ductal carcinoma with DCIS and a
site of ALH.

LABS:  None
EXAM:
BILATERAL BREAST MRI WITH AND WITHOUT CONTRAST
TECHNIQUE: Multiplanar, multisequence MR images of both breasts were obtained
prior to and following the intravenous administration of 8 ml of
Gadavist

[Series 2: T2 · axial · 3.0mm · 0.94mm/px · z∈[-163,+44]mm · 2 of 62 slices shown]
[im 1/62]
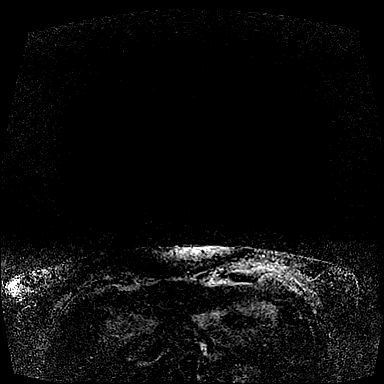
[im 62/62]
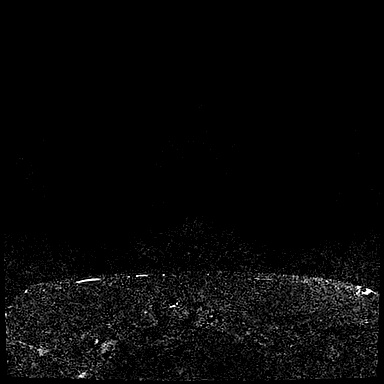

[Series 3: T1 fat-sat · axial · 1.2mm · 0.80mm/px · z∈[-164,+46]mm · 7 of 176 slices shown (1 of 4)]
[im 1/176]
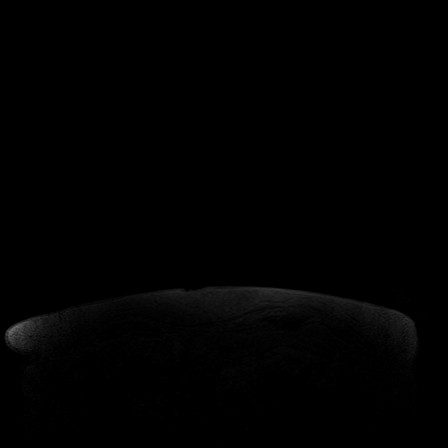
[im 30/176]
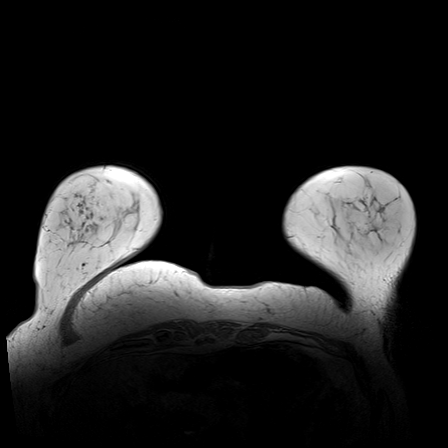
[im 59/176]
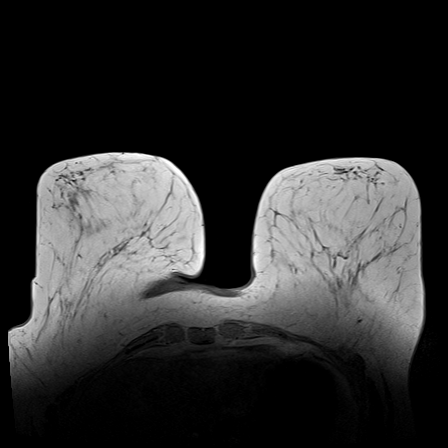
[im 88/176]
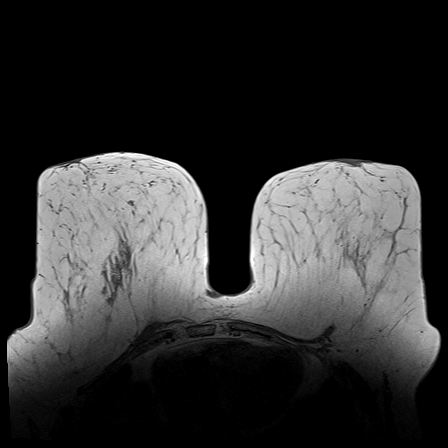
[im 117/176]
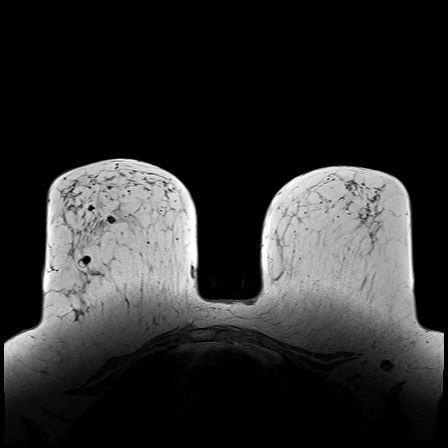
[im 146/176]
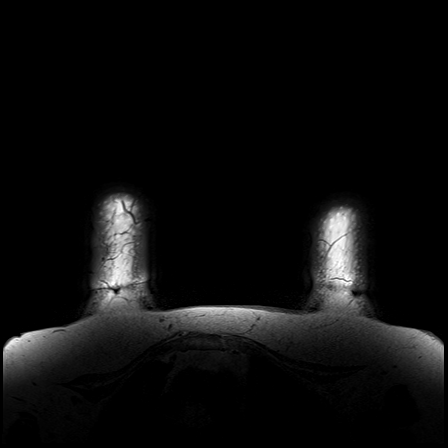
[im 176/176]
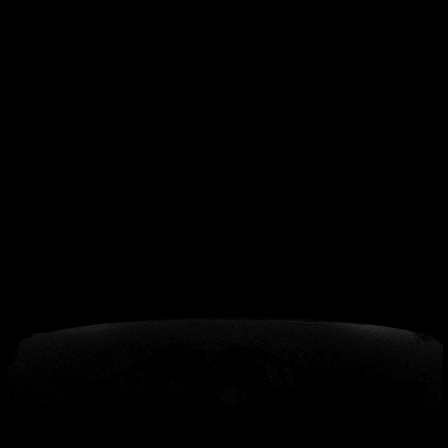

[Series 5: T1 fat-sat · axial · 1.6mm · 0.87mm/px · z∈[-174,+55]mm · 5 of 138 slices shown (2 of 4)]
[im 1/138]
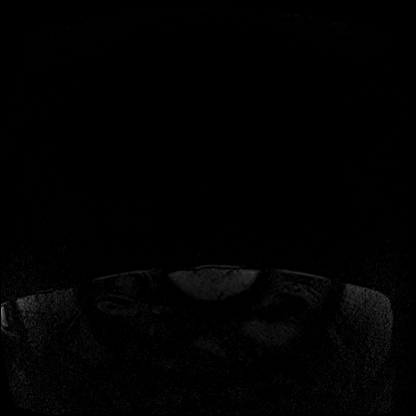
[im 35/138]
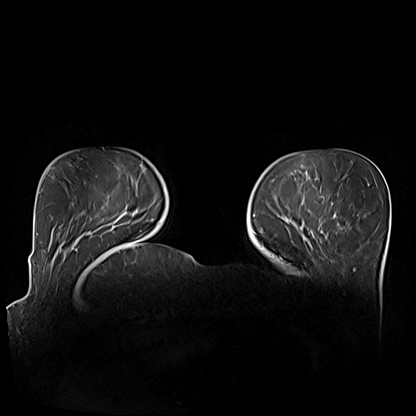
[im 69/138]
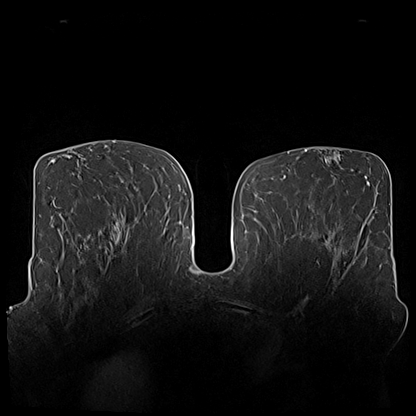
[im 103/138]
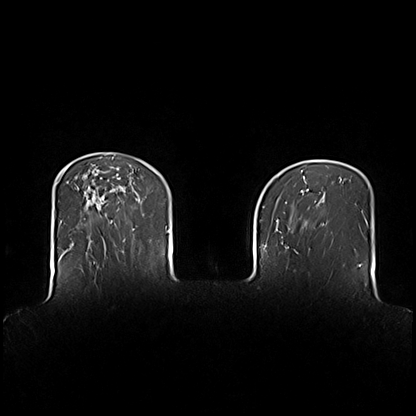
[im 138/138]
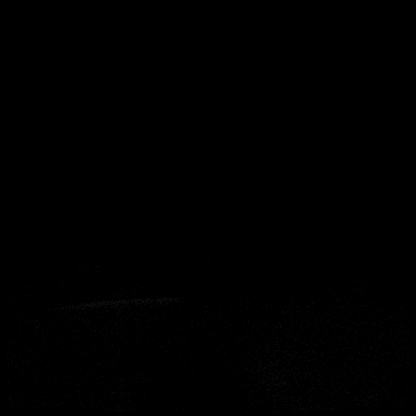

[Series 6: T1 fat-sat · axial · 1.6mm · 0.87mm/px · z∈[-174,+55]mm · 5 of 134 slices shown (3 of 4)]
[im 1/134]
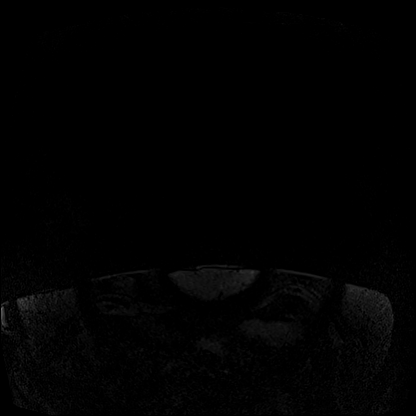
[im 34/134]
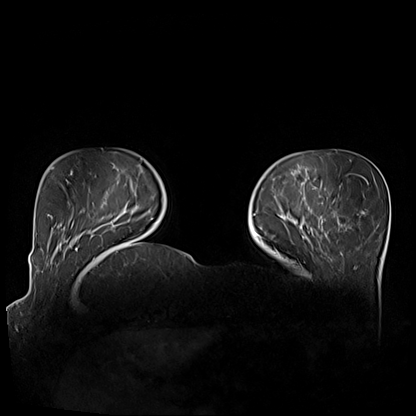
[im 67/134]
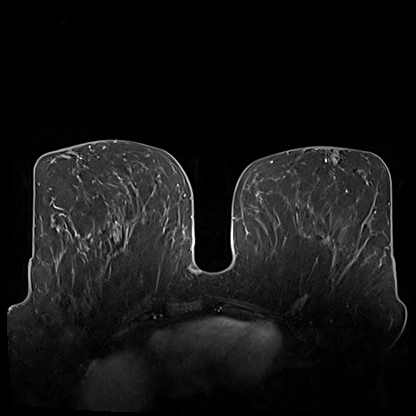
[im 100/134]
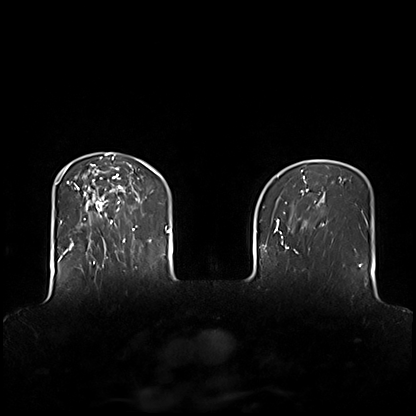
[im 134/134]
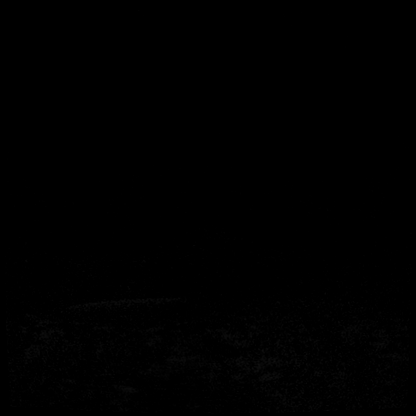

[Series 7: T1 · axial · 1.6mm · 0.87mm/px · z∈[-174,+55]mm · 6 of 144 slices shown (1 of 2)]
[im 1/144]
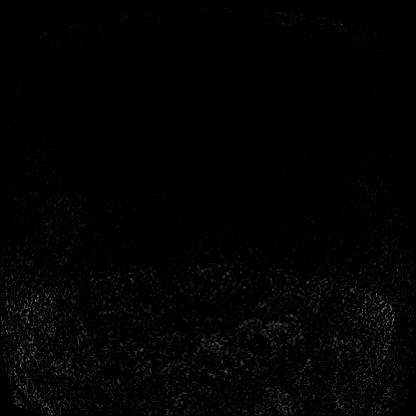
[im 29/144]
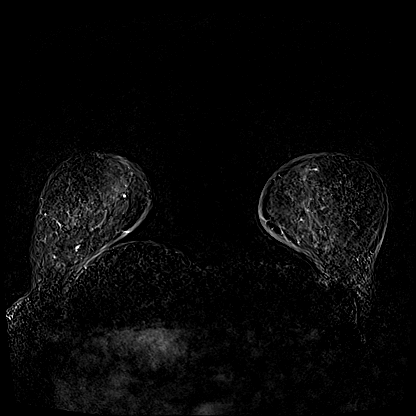
[im 58/144]
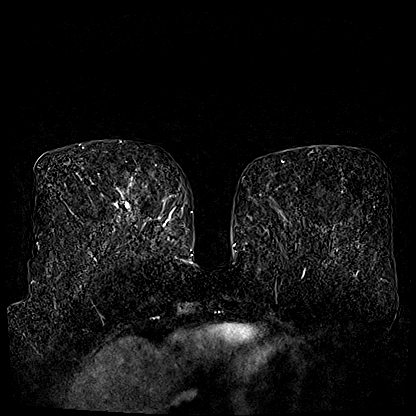
[im 86/144]
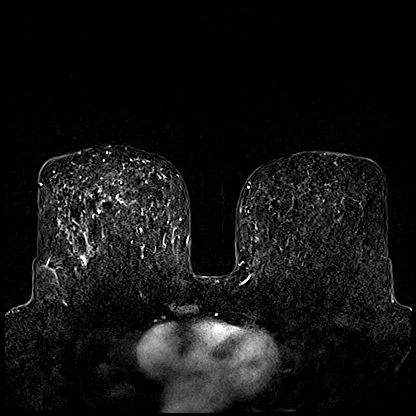
[im 115/144]
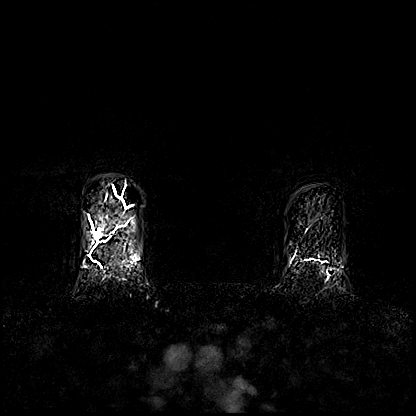
[im 144/144]
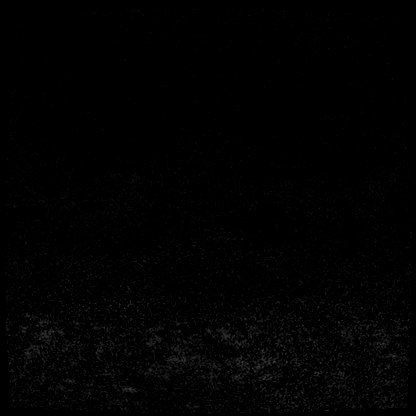

[Series 8: T1 · coronal · 360.0mm · 0.87mm/px · 1 of 3 slices shown (2 of 2)]
[im 1/3]
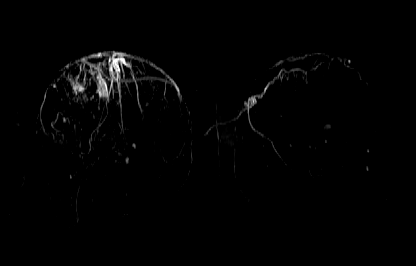

[Series 10: T1 fat-sat · axial · 1.6mm · 0.87mm/px · z∈[-174,-121]mm · 2 of 134 slices shown (4 of 4)]
[im 1/134]
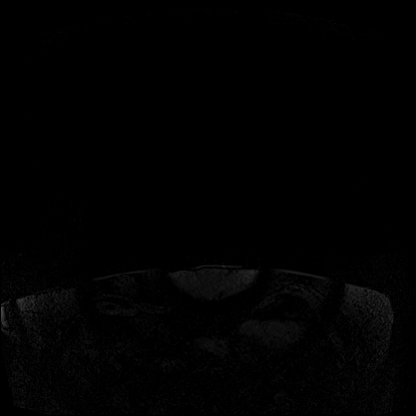
[im 34/134]
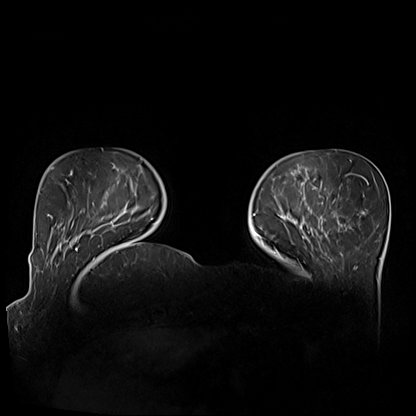

[28 of 48 positions shown; findings below may reference images not displayed]

Three-dimensional MR images were rendered by post-processing of the
original MR data on an independent workstation. The
three-dimensional MR images were interpreted, and findings are
reported in the following complete MRI report for this study. Three
dimensional images were evaluated at the independent interpreting
workstation using the DynaCAD thin client.
FINDINGS: Breast composition: b. Scattered fibroglandular tissue.

Background parenchymal enhancement: Mild

Right breast: 3 biopsy clips are identified consistent with recent
stereotactic biopsy.

The most anterolateral biopsy clip, a cylinder shaped clip, is at a
site of known invasive ductal carcinoma and DCIS. There is residual
enhancement in this region spanning 13 mm. The cylinder shaped clip
abuts the inferior aspect of this remaining enhancement.

The other site of known invasive ductal carcinoma and DCIS is marked
by both ribbon and barbell shaped clips. There is mild
postprocedural enhancement at the biopsy site but no enhancement to
suggest remaining malignancy.

The biopsy site demonstrating ALH is marked by a barbell shaped
clip. Only postprocedural enhancement is seen at this site with no
evidence of malignancy in this region.

No other MRI findings to suggest malignancy in the right breast.

Left breast: No mass or abnormal enhancement.

Lymph nodes: No abnormal appearing lymph nodes.

Ancillary findings:  None.
IMPRESSION: 1. The 3 biopsy sites in the right breast from recent stereotactic
biopsies are identified. The site of known malignancy marked with a
cylinder shaped clip demonstrates residual enhancement measuring up
to 13 mm. The cylinder shaped clip abuts the inferior aspect of this
enhancement. The second site of known malignancy, marked with both
ribbon and barbell shaped clips, demonstrates no enhancement to
suggest remaining malignancy. Only a small amount of postprocedural
enhancement remains. The biopsy site which demonstrated ALH, marked
with a barbell shaped clip only, demonstrates only mild
postprocedural enhancement with no evidence of malignancy. No other
sites of malignancy are seen in the right breast.
2. No evidence of malignancy in the left breast.
3. No abnormal lymph nodes identified.

RECOMMENDATION:
Recommend continued surgical and oncologic follow up.

BI-RADS CATEGORY  6: Known biopsy-proven malignancy.

## 2022-12-30 ENCOUNTER — Other Ambulatory Visit (HOSPITAL_COMMUNITY): Payer: Self-pay | Admitting: Physician Assistant

## 2023-02-07 ENCOUNTER — Ambulatory Visit (HOSPITAL_COMMUNITY)
Admission: RE | Admit: 2023-02-07 | Discharge: 2023-02-07 | Disposition: A | Discharge status: Home / Self Care | Payer: Medicare PPO | Source: Ambulatory Visit | Attending: Physician Assistant | Admitting: Physician Assistant

## 2023-02-07 ENCOUNTER — Encounter (HOSPITAL_COMMUNITY): Payer: Self-pay | Admitting: Physician Assistant

## 2023-02-07 VITALS — BP 118/74 | HR 67 | Ht 65.0 in | Wt 170.2 lb

## 2023-02-07 DIAGNOSIS — I341 Nonrheumatic mitral (valve) prolapse: Secondary | ICD-10-CM | POA: Insufficient documentation

## 2023-02-07 DIAGNOSIS — I480 Paroxysmal atrial fibrillation: Secondary | ICD-10-CM | POA: Diagnosis not present

## 2023-02-07 DIAGNOSIS — I483 Typical atrial flutter: Secondary | ICD-10-CM | POA: Insufficient documentation

## 2023-02-07 NOTE — Progress Notes (Signed)
Primary Care Physician: Richmond Campbell., PA-C Primary Electrophysiologist: Dr Graciela Husbands Primary Cardiologist: Dr Jens Som  Referring Physician: Dr Johney Frame   Sheri Sims is a 72 y.o. female with a history of paroxysmal atrial fibrillation, atrial flutter, and MVP who presents for follow up in the [facility]. Patient unfortunately had more episodes of atrial fibrillation and atrial flutter despite the higher dose of flecainide. She underwent afib and flutter ablation with Dr Johney Frame on 11/10/19. She did visit the ED on 11/10/19 with CP and an elevated troponin which were felt to be 2/2 her ablation.   On follow up today, patient reports that she has done very well since her last visit. She did have some brief palpitations about one month ago but these have resolved.   Today, she denies symptoms of palpitations, chest pain, shortness of breath, orthopnea, PND, lower extremity edema, dizziness, presyncope, syncope, snoring, daytime somnolence, bleeding, or neurologic sequela. The patient is tolerating medications without difficulties and is otherwise without complaint today.    Atrial Fibrillation Risk Factors:  she does not have symptoms or diagnosis of sleep apnea. she does not have a history of rheumatic fever. she does not have a history of alcohol use. The patient does not have a history of early familial atrial fibrillation or other arrhythmias.   Atrial Fibrillation Management history:  Previous antiarrhythmic drugs: flecainide Previous cardioversions: none Previous ablations: 11/10/19 Anticoagulation history: Xarelto, Eliquis (switched back to Xarelto 2/2 side effects)    Past Medical History:  Diagnosis Date   Arthritis    Atrial flutter (HCC)    Cancer (HCC)    breast cancer   Costochondritis    Enthesopathy of hip region    Family history of breast cancer 07/21/2020   Fatigue    Fibromyalgia    Helicobacter pylori infection    Insomnia     MVP (mitral valve prolapse)    OF ANTERIOR MITRAL VALVE LEAFLET   Obesity    Paroxysmal atrial fibrillation (HCC)    Pharyngitis    Pneumonia    Sinusitis, chronic     Current Outpatient Medications  Medication Sig Dispense Refill   acetaminophen (TYLENOL) 650 MG CR tablet Take 650 mg by mouth at bedtime.     B Complex Vitamins (B COMPLEX 1 PO) Take 1 capsule by mouth daily.     calcium carbonate (TUMS - DOSED IN MG ELEMENTAL CALCIUM) 500 MG chewable tablet Chew 1 tablet by mouth as needed for indigestion or heartburn.      Cholecalciferol (D3 PO) Take 2,000 Units by mouth daily.     loratadine (CLARITIN) 10 MG tablet Take 10 mg by mouth daily as needed for allergies.     MAGNESIUM GLYCINATE PO Take 1 tablet by mouth daily.     Menaquinone-7 (VITAMIN K2 PO) Take 1 tablet by mouth daily.     metoprolol succinate (TOPROL-XL) 50 MG 24 hr tablet Take 1 tablet by mouth daily. Contact A-fib Clinic for f/u appointment to receive further refills. [phone] 90 tablet 0   Multiple Vitamin (MULTIVITAMIN) capsule Take 1 capsule by mouth every other day.     Multiple Vitamins-Minerals (ZINC PO) Take 1 capsule by mouth daily.     Polyethyl Glycol-Propyl Glycol (SYSTANE ULTRA) 0.4-0.3 % SOLN Place 1 drop into both eyes 2 (two) times daily.     sodium chloride (OCEAN) 0.65 % SOLN nasal spray Place 1 spray into both nostrils as needed for congestion.     tobramycin (  TOBREX) 0.3 % ophthalmic solution Place 1 drop into the right eye See admin instructions. For use on the day before, day of, and day after eye injection     No current facility-administered medications for this encounter.    ROS- All systems are reviewed and negative except as per the HPI above.  Physical Exam: Vitals:   02/07/23 1008  BP: 118/74  Pulse: 67  Weight: 77.2 kg  Height: 5\' 5"  (1.651 m)     GEN: Well nourished, well developed in no acute distress NECK: No JVD; No carotid bruits CARDIAC: Regular rate and rhythm, no  murmurs, rubs, gallops RESPIRATORY:  Clear to auscultation without rales, wheezing or rhonchi  ABDOMEN: Soft, non-tender, non-distended EXTREMITIES:  No edema; No deformity    Wt Readings from Last 3 Encounters:  02/07/23 77.2 kg  09/07/22 78 kg  11/02/21 77.6 kg    EKG today demonstrates  SR Vent. rate 67 BPM PR interval 124 ms QRS duration 72 ms QT/QTcB 388/409 ms  Echo 11/23/21 demonstrated   1. Left ventricular ejection fraction, by estimation, is 60 to 65%. The  left ventricle has normal function. The left ventricle has no regional  wall motion abnormalities. There is mild left ventricular hypertrophy.  Left ventricular diastolic parameters are indeterminate.   2. Right ventricular systolic function is normal. The right ventricular  size is normal. There is normal pulmonary artery systolic pressure. The  estimated right ventricular systolic pressure is 30.0 mmHg.   3. Right atrial size was mildly dilated.   4. The mitral valve is normal in structure. No evidence of mitral valve  regurgitation. No evidence of mitral stenosis.   5. The aortic valve is tricuspid. Aortic valve regurgitation is not  visualized. No aortic stenosis is present.   6. The inferior vena cava is normal in size with greater than 50%  respiratory variability, suggesting right atrial pressure of 3 mmHg.    Epic records are reviewed at length today   CHA2DS2-VASc Score = 2  The patient's score is based upon: CHF History: 0 HTN History: 0 Diabetes History: 0 Stroke History: 0 Vascular Disease History: 0 Age Score: 1 Gender Score: 1       ASSESSMENT AND PLAN: Paroxysmal Atrial Fibrillation/typical atrial flutter The patient's CHA2DS2-VASc score is 2, indicating a 2.2% annual risk of stroke. S/p afib and atrial flutter ablation 11/06/19 with Dr Johney Frame. Patient appears to be maintaining SR.  Continue Toprol 50 mg daily. Not currently on anticoagulation with low CV score. However, when she turns  73 her CV score will be 3.  Kardia mobile for home monitoring.     Follow up in the AF clinic in one year.    [name] PA-C Afib Clinic [facility] [address] [place], [place] [address] [phone] 02/07/2023 10:31 AM

## 2023-02-09 ENCOUNTER — Ambulatory Visit: Payer: Medicare PPO | Admitting: Internal Medicine

## 2023-03-29 ENCOUNTER — Other Ambulatory Visit (HOSPITAL_COMMUNITY): Payer: Self-pay | Admitting: Physician Assistant

## 2023-05-09 ENCOUNTER — Other Ambulatory Visit (HOSPITAL_COMMUNITY): Payer: Self-pay

## 2023-05-09 MED ORDER — METOPROLOL SUCCINATE ER 50 MG PO TB24: ORAL_TABLET | ORAL | 2 refills | Status: DC

## 2024-02-04 ENCOUNTER — Encounter: Payer: Self-pay | Admitting: Cardiology

## 2024-02-13 NOTE — Progress Notes (Signed)
 HPI: Follow-up paroxysmal atrial fibrillation, atrial flutter and mitral valve prolapse. Previously followed by Dr. Kelsie. Nuclear study January 2019 showed ejection fraction 70% and normal perfusion. CTA prior to atrial fibrillation ablation May 2021 showed calcium score 13.3 which was 44th percentile. Had atrial fibrillation ablation May 2021. CTA September 2022 showed small pulmonary embolus, coronary calcification. Echocardiogram May 2023 showed normal LV function, mild left ventricular hypertrophy, mild right atrial enlargement. Since last seen she feels as though she has not had any recurrent atrial fibrillation.  She denies exertional chest pain or syncope.  She does have dyspnea with more vigorous activities.  Current Outpatient Medications  Medication Sig Dispense Refill   acetaminophen (TYLENOL) 650 MG CR tablet Take 650 mg by mouth at bedtime.     B Complex Vitamins (B COMPLEX 1 PO) Take 1 capsule by mouth daily.     calcium carbonate (TUMS - DOSED IN MG ELEMENTAL CALCIUM) 500 MG chewable tablet Chew 1 tablet by mouth as needed for indigestion or heartburn.      Cholecalciferol (D3 PO) Take 2,000 Units by mouth daily.     loratadine (CLARITIN) 10 MG tablet Take 10 mg by mouth daily as needed for allergies.     MAGNESIUM GLYCINATE PO Take 1 tablet by mouth daily.     Menaquinone-7 (VITAMIN K2 PO) Take 1 tablet by mouth daily.     metoprolol succinate (TOPROL-XL) 50 MG 24 hr tablet Take 1 tablet by mouth daily. 90 tablet 2   Multiple Vitamin (MULTIVITAMIN) capsule Take 1 capsule by mouth every other day.     Multiple Vitamins-Minerals (ZINC PO) Take 1 capsule by mouth daily.     Polyethyl Glycol-Propyl Glycol (SYSTANE ULTRA) 0.4-0.3 % SOLN Place 1 drop into both eyes 2 (two) times daily.     sodium chloride (OCEAN) 0.65 % SOLN nasal spray Place 1 spray into both nostrils as needed for congestion.     tobramycin (TOBREX) 0.3 % ophthalmic solution Place 1 drop into the right eye See  admin instructions. For use on the day before, day of, and day after eye injection     No current facility-administered medications for this visit.     Past Medical History:  Diagnosis Date   Arthritis    Atrial flutter (HCC)    Cancer (HCC)    breast cancer   Costochondritis    Enthesopathy of hip region    Family history of breast cancer 07/21/2020   Fatigue    Fibromyalgia    Helicobacter pylori infection    Insomnia    MVP (mitral valve prolapse)    OF ANTERIOR MITRAL VALVE LEAFLET   Obesity    Paroxysmal atrial fibrillation (HCC)    Pharyngitis    Pneumonia    Sinusitis, chronic     Past Surgical History:  Procedure Laterality Date   ABDOMINAL HYSTERECTOMY     APPENDECTOMY     ATRIAL FIBRILLATION ABLATION N/A 11/06/2019   Procedure: ATRIAL FIBRILLATION ABLATION;  Surgeon: Kelsie Agent, MD;  Location: [facility] INVASIVE CV LAB;  Service: Cardiovascular;  Laterality: N/A;   BREAST LUMPECTOMY     BREAST LUMPECTOMY WITH RADIOACTIVE SEED LOCALIZATION Right 09/02/2020   Procedure: RIGHT BREAST LUMPECTOMY WITH RADIOACTIVE SEED LOCALIZATION;  Surgeon: Vernetta Berg, MD;  Location: [facility] OR;  Service: General;  Laterality: Right;  LMA, COORD CASE W/ [place]-NEEDS 2 HOURS [place] NEEDS 1 HOUR   BREAST REDUCTION SURGERY Bilateral 09/02/2020   Procedure: BILATERAL ONCOPLASTIC BREAST REDUCTION;  Surgeon: Lowery,  Estefana RAMAN, DO;  Location: [facility] OR;  Service: Plastics;  Laterality: Bilateral;   CARDIAC CATHETERIZATION  2010   Normal   CHOLECYSTECTOMY     STRESS ECHO TEST  05/31/2010   NO ISCHEMIA   TONSILLECTOMY     TRANSTHORACIC ECHOCARDIOGRAM  05/16/2010   EF 55-60%    Social History   Socioeconomic History   Marital status: Married    Spouse name: Not on file   Number of children: 2   Years of education: Not on file   Highest education level: Not on file  Occupational History   Occupation: Retired Programmer, systems  Tobacco Use   Smoking status: Never   Smokeless tobacco: Never   Vaping Use   Vaping status: Never Used  Substance and Sexual Activity   Alcohol use: No   Drug use: No   Sexual activity: Not on file  Other Topics Concern   Not on file  Social History Narrative   Lives in [place] with spouse.   Social Drivers of Corporate investment banker Strain: Low Risk  (07/09/2022)   Received from [facility]   Overall Financial Resource Strain (CARDIA)    Difficulty of Paying Living Expenses: Not hard at all  Food Insecurity: Low Risk  (12/05/2023)   Received from [facility]   Hunger Vital Sign    Within the past 12 months, you worried that your food would run out before you got money to buy more: Never true    Within the past 12 months, the food you bought just didn't last and you didn't have money to get more. : Never true  Transportation Needs: No Transportation Needs (12/05/2023)   Received from [facility]    In the past 12 months, has lack of reliable transportation kept you from medical appointments, meetings, work or from getting things needed for daily living? : No  Physical Activity: Unknown (07/09/2022)   Received from [facility]   Exercise Vital Sign    On average, how many days per week do you engage in moderate to strenuous exercise (like a brisk walk)?: Patient declined    Minutes of Exercise per Session: Not on file  Stress: No Stress Concern Present (07/09/2022)   Received from [facility] - Occupational Stress Questionnaire    Feeling of Stress : Not at all  Social Connections: Socially Integrated (07/09/2022)   Received from [facility]   Social Network    How would you rate your social network (family, work, friends)?: Good participation with social networks  Intimate Partner Violence: Not At Risk (07/09/2022)   Received from [facility]   HITS    Over the last 12 months how often did your partner physically hurt you?: Never    Over the last 12 months how often did  your partner insult you or talk down to you?: Never    Over the last 12 months how often did your partner threaten you with physical harm?: Never    Over the last 12 months how often did your partner scream or curse at you?: Never    Family History  Problem Relation Age of Onset   Breast cancer Mother 15   Breast cancer Sister 35   Cancer Maternal Grandmother        metastatic, unknown primary; dx 65   Breast cancer Cousin        d. 28; maternal cousin    ROS: no fevers  or chills, productive cough, hemoptysis, dysphasia, odynophagia, melena, hematochezia, dysuria, hematuria, rash, seizure activity, orthopnea, PND, pedal edema, claudication. Remaining systems are negative.  Physical Exam: Well-developed well-nourished in no acute distress.  Skin is warm and dry.  HEENT is normal.  Neck is supple.  Chest is clear to auscultation with normal expansion.  Cardiovascular exam is regular rate and rhythm.  Abdominal exam nontender or distended. No masses palpated. Extremities show no edema. neuro grossly intact  EKG Interpretation Date/Time:  Wednesday February 27 2024 10:31:21 EDT Ventricular Rate:  70 PR Interval:  122 QRS Duration:  66 QT Interval:  368 QTC Calculation: 397 R Axis:   21  Text Interpretation: Normal sinus rhythm with sinus arrhythmia Low voltage QRS Confirmed by Pietro Rogue ([id number]) on 02/27/2024 10:38:50 AM    A/P  1 paroxysmal atrial fibrillation/flutter-patient is status post ablation with no documented recurrences.  Continue Toprol .  Anticoagulation was previously discontinued. CHADSvasc 3.  She has a Kardia device and if she has any documented recurrent atrial fibrillation will need to resume anticoagulation.  She would like to avoid otherwise.  2 coronary calcification-patient is not having chest pain.  She continues to decline statins.  3 history of mitral valve prolapse-this was not evident on most recent echocardiogram.  Rogue Pietro, MD

## 2024-02-27 ENCOUNTER — Ambulatory Visit: Attending: Cardiology | Admitting: Cardiology

## 2024-02-27 ENCOUNTER — Encounter: Payer: Self-pay | Admitting: Cardiology

## 2024-02-27 VITALS — BP 121/76 | HR 70 | Ht 65.0 in | Wt 162.0 lb

## 2024-02-27 DIAGNOSIS — I480 Paroxysmal atrial fibrillation: Secondary | ICD-10-CM | POA: Diagnosis not present

## 2024-02-27 DIAGNOSIS — I2510 Atherosclerotic heart disease of native coronary artery without angina pectoris: Secondary | ICD-10-CM | POA: Diagnosis not present

## 2024-02-27 NOTE — Patient Instructions (Signed)

## 2024-03-06 ENCOUNTER — Ambulatory Visit: Admitting: Cardiology

## 2024-05-01 ENCOUNTER — Other Ambulatory Visit (HOSPITAL_COMMUNITY): Payer: Self-pay | Admitting: Physician Assistant

## 2024-07-30 ENCOUNTER — Other Ambulatory Visit (HOSPITAL_COMMUNITY): Payer: Self-pay | Admitting: Physician Assistant

## 2024-09-11 ENCOUNTER — Ambulatory Visit: Admitting: Internal Medicine
